# Patient Record
Sex: Male | Born: 1940 | Race: White | Hispanic: No | Marital: Married | Smoking: Former smoker
Health system: Southern US, Community
[De-identification: ages and names within clinical notes are randomized; demographics above are authoritative.]

## PROBLEM LIST (undated history)

## (undated) DIAGNOSIS — M1712 Unilateral primary osteoarthritis, left knee: Secondary | ICD-10-CM

## (undated) DIAGNOSIS — T63441A Toxic effect of venom of bees, accidental (unintentional), initial encounter: Secondary | ICD-10-CM

## (undated) DIAGNOSIS — I219 Acute myocardial infarction, unspecified: Secondary | ICD-10-CM

## (undated) DIAGNOSIS — E785 Hyperlipidemia, unspecified: Secondary | ICD-10-CM

## (undated) DIAGNOSIS — I10 Essential (primary) hypertension: Secondary | ICD-10-CM

## (undated) HISTORY — PX: WRIST SURGERY: SHX841

## (undated) HISTORY — DX: Unilateral primary osteoarthritis, left knee: M17.12

## (undated) HISTORY — PX: BILATERAL CARPAL TUNNEL RELEASE: SHX6508

## (undated) HISTORY — PX: JOINT REPLACEMENT: SHX530

## (undated) HISTORY — DX: Hyperlipidemia, unspecified: E78.5

## (undated) HISTORY — DX: Essential (primary) hypertension: I10

## (undated) HISTORY — PX: EYE SURGERY: SHX253

## (undated) HISTORY — PX: HERNIA REPAIR: SHX51

## (undated) HISTORY — DX: Acute myocardial infarction, unspecified: I21.9

## (undated) HISTORY — PX: KNEE ARTHROSCOPY: SUR90

## (undated) HISTORY — PX: TONSILLECTOMY: SUR1361

## (undated) HISTORY — PX: APPENDECTOMY: SHX54

---
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
## 2000-05-01 ENCOUNTER — Ambulatory Visit (HOSPITAL_COMMUNITY): Admission: RE | Admit: 2000-05-01 | Discharge: 2000-05-01 | Payer: Self-pay | Admitting: Oncology

## 2000-05-01 ENCOUNTER — Encounter: Payer: Self-pay | Admitting: Oncology

## 2000-08-01 ENCOUNTER — Encounter: Payer: Self-pay | Admitting: Oncology

## 2000-08-01 ENCOUNTER — Ambulatory Visit (HOSPITAL_COMMUNITY): Admission: RE | Admit: 2000-08-01 | Discharge: 2000-08-01 | Payer: Self-pay | Admitting: Oncology

## 2001-10-19 ENCOUNTER — Encounter: Admission: RE | Admit: 2001-10-19 | Discharge: 2001-10-19 | Payer: Self-pay | Admitting: Family Medicine

## 2001-10-19 ENCOUNTER — Encounter: Payer: Self-pay | Admitting: Family Medicine

## 2001-11-02 ENCOUNTER — Encounter: Payer: Self-pay | Admitting: Family Medicine

## 2001-11-02 ENCOUNTER — Encounter: Admission: RE | Admit: 2001-11-02 | Discharge: 2001-11-02 | Payer: Self-pay | Admitting: Family Medicine

## 2003-09-08 ENCOUNTER — Ambulatory Visit (HOSPITAL_COMMUNITY): Admission: RE | Admit: 2003-09-08 | Discharge: 2003-09-08 | Payer: Self-pay | Admitting: Orthopaedic Surgery

## 2004-09-24 ENCOUNTER — Ambulatory Visit: Payer: Self-pay | Admitting: Oncology

## 2005-01-28 ENCOUNTER — Ambulatory Visit: Payer: Self-pay | Admitting: Oncology

## 2005-02-09 ENCOUNTER — Ambulatory Visit (HOSPITAL_COMMUNITY): Admission: RE | Admit: 2005-02-09 | Discharge: 2005-02-09 | Payer: Self-pay | Admitting: Oncology

## 2005-06-08 ENCOUNTER — Ambulatory Visit: Payer: Self-pay | Admitting: Oncology

## 2005-06-20 ENCOUNTER — Encounter: Admission: RE | Admit: 2005-06-20 | Discharge: 2005-06-20 | Payer: Self-pay | Admitting: Specialist

## 2005-10-04 ENCOUNTER — Ambulatory Visit: Payer: Self-pay | Admitting: Oncology

## 2005-11-28 ENCOUNTER — Inpatient Hospital Stay (HOSPITAL_COMMUNITY): Admission: RE | Admit: 2005-11-28 | Discharge: 2005-12-02 | Payer: Self-pay | Admitting: Orthopedic Surgery

## 2006-01-26 ENCOUNTER — Ambulatory Visit: Payer: Self-pay | Admitting: Oncology

## 2006-02-01 LAB — CBC & DIFF AND RETIC
Basophils Absolute: 0.1 10*3/uL (ref 0.0–0.1)
EOS%: 2.7 % (ref 0.0–7.0)
Eosinophils Absolute: 0.2 10*3/uL (ref 0.0–0.5)
HGB: 14.5 g/dL (ref 13.0–17.1)
LYMPH%: 36.3 % (ref 14.0–48.0)
MCH: 32 pg (ref 28.0–33.4)
MCV: 92.5 fL (ref 81.6–98.0)
MONO%: 9 % (ref 0.0–13.0)
NEUT#: 3.3 10*3/uL (ref 1.5–6.5)
Platelets: 340 10*3/uL (ref 145–400)
RBC: 4.51 10*6/uL (ref 4.20–5.71)
RDW: 13.4 % (ref 11.2–14.6)
RETIC #: 63.1 10*3/uL (ref 31.8–103.9)

## 2006-02-01 LAB — COMPREHENSIVE METABOLIC PANEL
Albumin: 4.2 g/dL (ref 3.5–5.2)
BUN: 17 mg/dL (ref 6–23)
Calcium: 9.3 mg/dL (ref 8.4–10.5)
Chloride: 105 mEq/L (ref 96–112)
Creatinine, Ser: 1.1 mg/dL (ref 0.4–1.5)
Glucose, Bld: 157 mg/dL — ABNORMAL HIGH (ref 70–99)
Potassium: 4.2 mEq/L (ref 3.5–5.3)

## 2006-02-01 LAB — FERRITIN: Ferritin: 49 ng/mL (ref 22–322)

## 2006-02-01 LAB — IRON AND TIBC: %SAT: 40 % (ref 20–55)

## 2006-05-29 ENCOUNTER — Ambulatory Visit: Payer: Self-pay | Admitting: Oncology

## 2006-05-31 LAB — CBC WITH DIFFERENTIAL/PLATELET
Basophils Absolute: 0.1 10*3/uL (ref 0.0–0.1)
EOS%: 3.1 % (ref 0.0–7.0)
Eosinophils Absolute: 0.2 10*3/uL (ref 0.0–0.5)
HCT: 42.5 % (ref 38.7–49.9)
HGB: 15.2 g/dL (ref 13.0–17.1)
MCH: 31.1 pg (ref 28.0–33.4)
MCV: 87.2 fL (ref 81.6–98.0)
MONO%: 8.9 % (ref 0.0–13.0)
NEUT#: 4 10*3/uL (ref 1.5–6.5)
NEUT%: 51.6 % (ref 40.0–75.0)

## 2006-06-14 LAB — COMPREHENSIVE METABOLIC PANEL
Albumin: 3.9 g/dL (ref 3.5–5.2)
Alkaline Phosphatase: 64 U/L (ref 39–117)
CO2: 28 mEq/L (ref 19–32)
Glucose, Bld: 112 mg/dL — ABNORMAL HIGH (ref 70–99)
Potassium: 4 mEq/L (ref 3.5–5.3)
Sodium: 140 mEq/L (ref 135–145)
Total Protein: 6.9 g/dL (ref 6.0–8.3)

## 2006-06-14 LAB — CBC WITH DIFFERENTIAL/PLATELET
Basophils Absolute: 0.1 10*3/uL (ref 0.0–0.1)
EOS%: 3 % (ref 0.0–7.0)
Eosinophils Absolute: 0.2 10*3/uL (ref 0.0–0.5)
HGB: 13.8 g/dL (ref 13.0–17.1)
NEUT#: 3.5 10*3/uL (ref 1.5–6.5)
RDW: 13.2 % (ref 11.2–14.6)
WBC: 6.6 10*3/uL (ref 4.0–10.0)
lymph#: 2.2 10*3/uL (ref 0.9–3.3)

## 2006-06-14 LAB — IRON AND TIBC
Iron: 129 ug/dL (ref 42–165)
UIBC: 172 ug/dL

## 2006-10-06 ENCOUNTER — Ambulatory Visit: Payer: Self-pay | Admitting: Oncology

## 2006-10-11 LAB — CBC & DIFF AND RETIC
BASO%: 0.9 % (ref 0.0–2.0)
Basophils Absolute: 0.1 10*3/uL (ref 0.0–0.1)
HCT: 41.6 % (ref 38.7–49.9)
HGB: 15.1 g/dL (ref 13.0–17.1)
IRF: 0.31 (ref 0.070–0.380)
MCHC: 36.2 g/dL — ABNORMAL HIGH (ref 32.0–35.9)
MONO#: 0.6 10*3/uL (ref 0.1–0.9)
NEUT%: 57.6 % (ref 40.0–75.0)
RDW: 12.7 % (ref 11.2–14.6)
RETIC #: 48.9 10*3/uL (ref 31.8–103.9)
WBC: 7.6 10*3/uL (ref 4.0–10.0)
lymph#: 2.3 10*3/uL (ref 0.9–3.3)

## 2006-10-11 LAB — COMPREHENSIVE METABOLIC PANEL
Albumin: 4.2 g/dL (ref 3.5–5.2)
Alkaline Phosphatase: 60 U/L (ref 39–117)
BUN: 22 mg/dL (ref 6–23)
CO2: 27 mEq/L (ref 19–32)
Glucose, Bld: 111 mg/dL — ABNORMAL HIGH (ref 70–99)
Potassium: 4.5 mEq/L (ref 3.5–5.3)

## 2006-10-11 LAB — IRON AND TIBC
%SAT: 59 % — ABNORMAL HIGH (ref 20–55)
TIBC: 292 ug/dL (ref 215–435)

## 2006-10-11 LAB — MORPHOLOGY: RBC Comments: NORMAL

## 2006-10-12 ENCOUNTER — Ambulatory Visit (HOSPITAL_COMMUNITY): Admission: RE | Admit: 2006-10-12 | Discharge: 2006-10-12 | Payer: Self-pay | Admitting: Oncology

## 2006-12-13 IMAGING — CR DG CHEST 2V
2 series · 2 of 2 positions shown · non-contrast
Comparison: 11/23/05.

CLINICAL DATA: Left knee replacement, epigastric pain.  Postop fever. 
 CHEST - 2 VIEW:

[w chest pa]
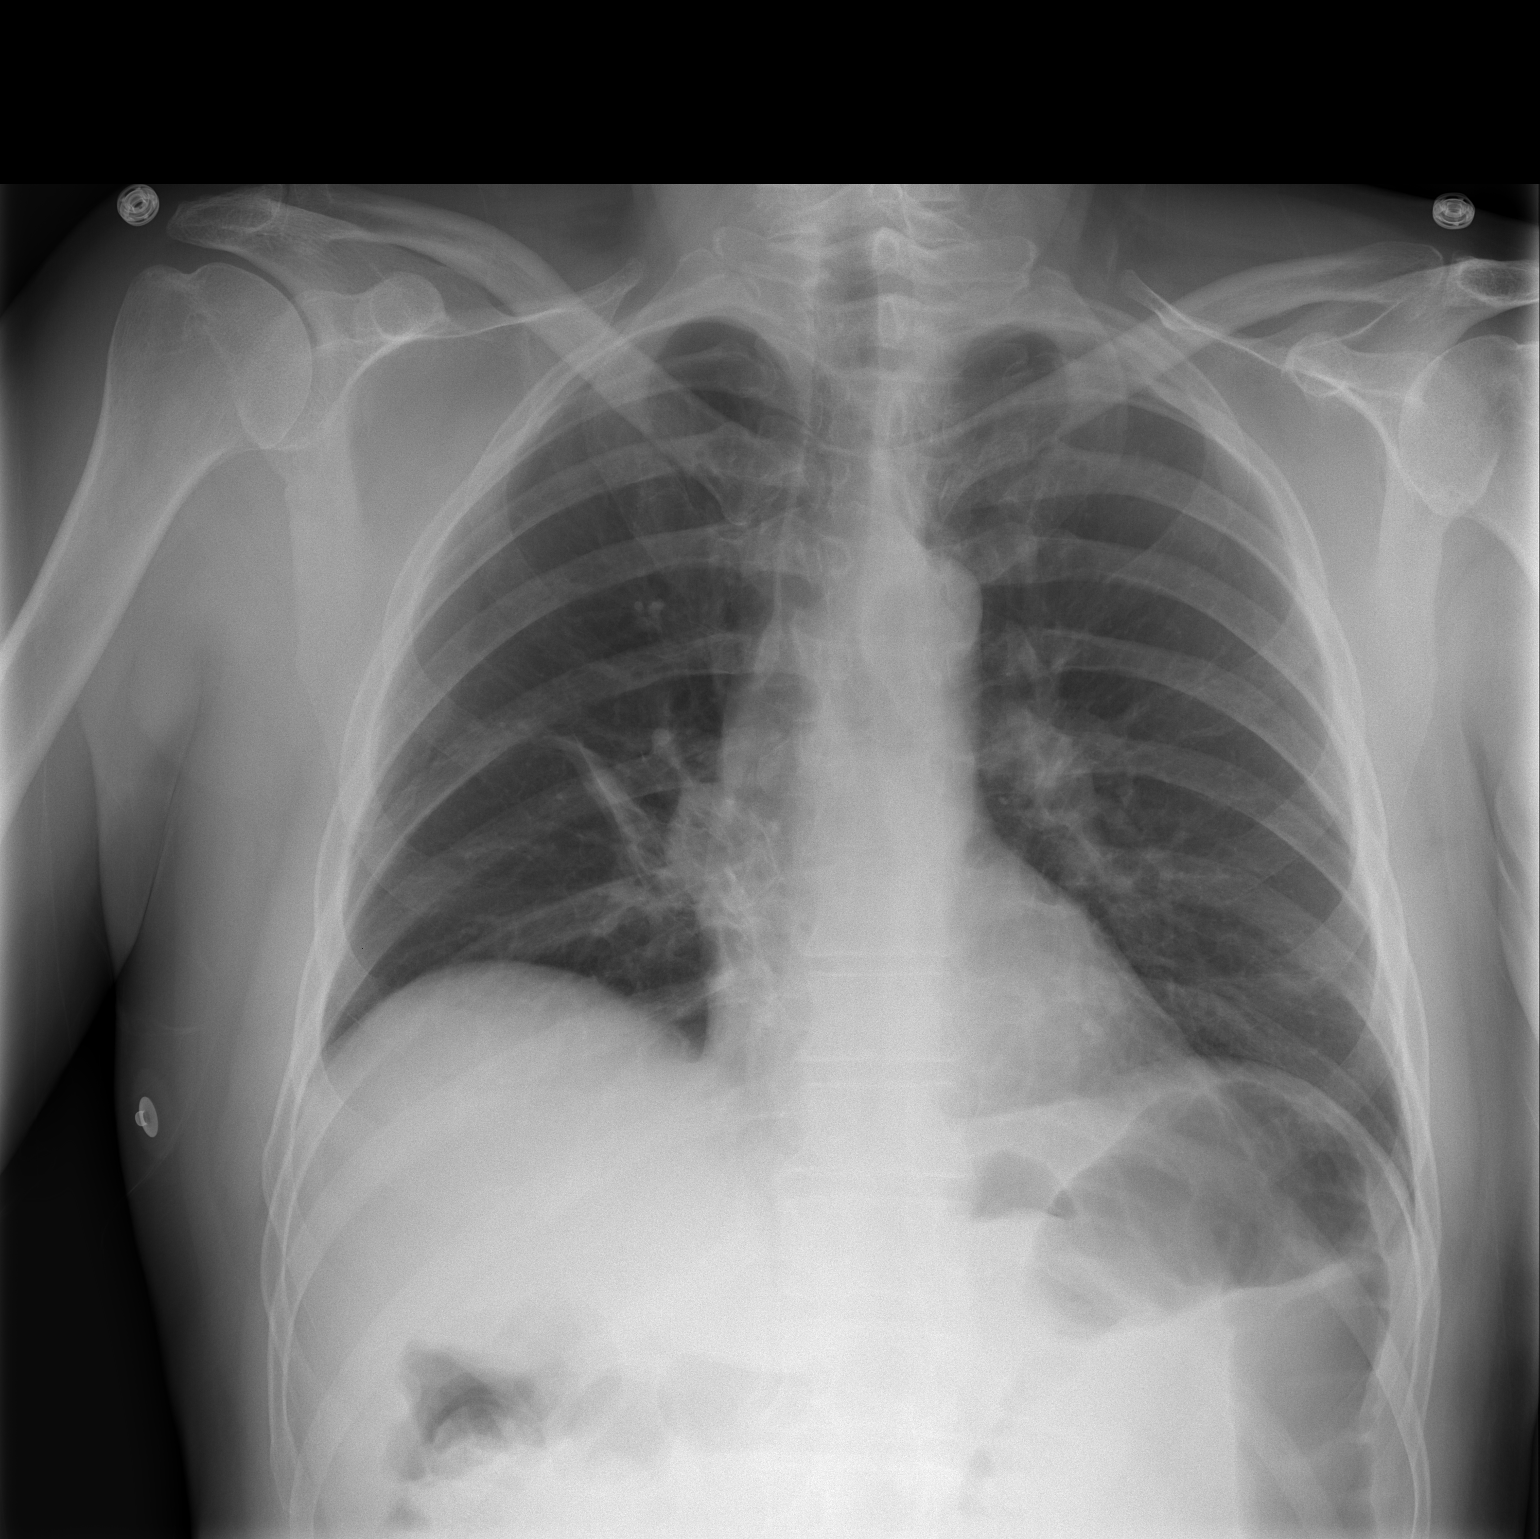

[w chest lat]
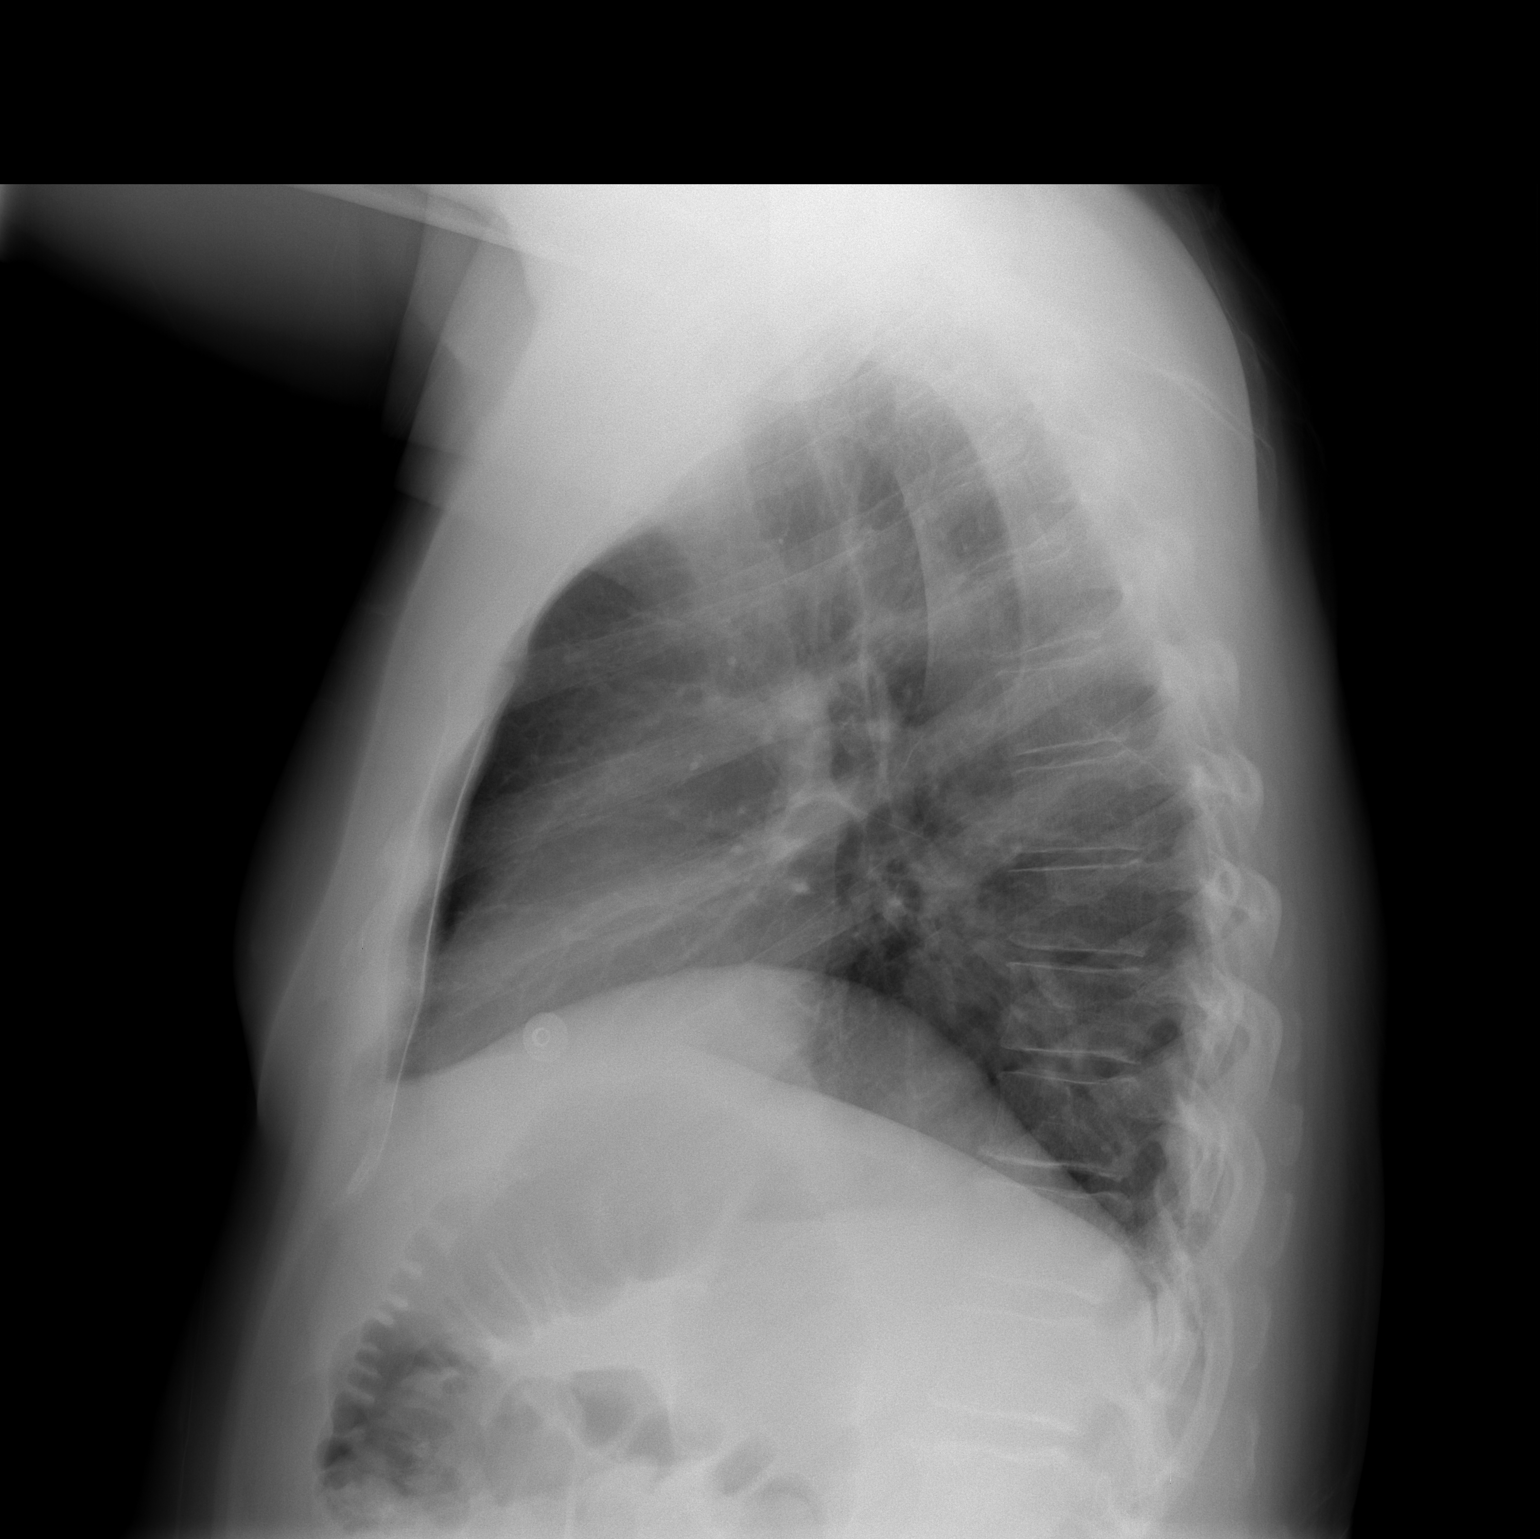

[2 of 2 positions shown; findings below may reference images not displayed]

Subsegmental atelectasis in the right lower lung zone.  No active infiltrate.  Cardiomediastinal silhouette appears unremarkable.
IMPRESSION: Linear right lower lung zone.

## 2007-02-05 ENCOUNTER — Ambulatory Visit: Payer: Self-pay | Admitting: Oncology

## 2007-02-07 LAB — CBC WITH DIFFERENTIAL/PLATELET
Eosinophils Absolute: 0.2 10*3/uL (ref 0.0–0.5)
MONO#: 0.7 10*3/uL (ref 0.1–0.9)
NEUT#: 3.3 10*3/uL (ref 1.5–6.5)
RBC: 4.72 10*6/uL (ref 4.20–5.71)
RDW: 13.1 % (ref 11.2–14.6)
WBC: 6.8 10*3/uL (ref 4.0–10.0)
lymph#: 2.7 10*3/uL (ref 0.9–3.3)

## 2007-06-07 ENCOUNTER — Ambulatory Visit: Payer: Self-pay | Admitting: Oncology

## 2007-06-11 LAB — CBC & DIFF AND RETIC
Basophils Absolute: 0.1 10*3/uL (ref 0.0–0.1)
Eosinophils Absolute: 0.1 10*3/uL (ref 0.0–0.5)
HCT: 42.1 % (ref 38.7–49.9)
HGB: 15.3 g/dL (ref 13.0–17.1)
LYMPH%: 36.8 % (ref 14.0–48.0)
MONO#: 0.6 10*3/uL (ref 0.1–0.9)
NEUT#: 3.2 10*3/uL (ref 1.5–6.5)
NEUT%: 50.5 % (ref 40.0–75.0)
Platelets: 284 10*3/uL (ref 145–400)
RBC: 4.58 10*6/uL (ref 4.20–5.71)
Retic %: 1.3 % (ref 0.7–2.3)
WBC: 6.4 10*3/uL (ref 4.0–10.0)

## 2007-06-11 LAB — COMPREHENSIVE METABOLIC PANEL
ALT: 19 U/L (ref 0–53)
AST: 20 U/L (ref 0–37)
Alkaline Phosphatase: 61 U/L (ref 39–117)
Chloride: 106 mEq/L (ref 96–112)
Creatinine, Ser: 1.09 mg/dL (ref 0.40–1.50)
Total Bilirubin: 1.4 mg/dL — ABNORMAL HIGH (ref 0.3–1.2)

## 2007-10-09 ENCOUNTER — Ambulatory Visit: Payer: Self-pay | Admitting: Oncology

## 2007-10-11 LAB — COMPREHENSIVE METABOLIC PANEL
Alkaline Phosphatase: 60 U/L (ref 39–117)
CO2: 23 mEq/L (ref 19–32)
Creatinine, Ser: 1.1 mg/dL (ref 0.40–1.50)
Glucose, Bld: 123 mg/dL — ABNORMAL HIGH (ref 70–99)
Total Bilirubin: 1.3 mg/dL — ABNORMAL HIGH (ref 0.3–1.2)

## 2007-10-11 LAB — CBC WITH DIFFERENTIAL/PLATELET
BASO%: 0.5 % (ref 0.0–2.0)
Eosinophils Absolute: 0.2 10*3/uL (ref 0.0–0.5)
HCT: 44 % (ref 38.7–49.9)
LYMPH%: 33.3 % (ref 14.0–48.0)
MCHC: 35.8 g/dL (ref 32.0–35.9)
MCV: 94.4 fL (ref 81.6–98.0)
MONO#: 0.6 10*3/uL (ref 0.1–0.9)
MONO%: 9.3 % (ref 0.0–13.0)
NEUT%: 53.1 % (ref 40.0–75.0)
Platelets: 295 10*3/uL (ref 145–400)
RBC: 4.66 10*6/uL (ref 4.20–5.71)
WBC: 6.2 10*3/uL (ref 4.0–10.0)

## 2007-10-11 LAB — IRON AND TIBC
%SAT: 53 % (ref 20–55)
Iron: 137 ug/dL (ref 42–165)
TIBC: 257 ug/dL (ref 215–435)

## 2007-10-11 LAB — FERRITIN: Ferritin: 124 ng/mL (ref 22–322)

## 2007-10-11 LAB — LACTATE DEHYDROGENASE: LDH: 200 U/L (ref 94–250)

## 2008-02-05 ENCOUNTER — Ambulatory Visit: Payer: Self-pay | Admitting: Oncology

## 2008-02-07 LAB — IRON AND TIBC
TIBC: 253 ug/dL (ref 215–435)
UIBC: 126 ug/dL

## 2008-02-07 LAB — COMPREHENSIVE METABOLIC PANEL
ALT: 24 U/L (ref 0–53)
AST: 21 U/L (ref 0–37)
Alkaline Phosphatase: 71 U/L (ref 39–117)
Sodium: 142 mEq/L (ref 135–145)
Total Bilirubin: 1.2 mg/dL (ref 0.3–1.2)
Total Protein: 7.2 g/dL (ref 6.0–8.3)

## 2008-02-07 LAB — CBC WITH DIFFERENTIAL/PLATELET
BASO%: 0.6 % (ref 0.0–2.0)
LYMPH%: 34.2 % (ref 14.0–48.0)
MCHC: 34.9 g/dL (ref 32.0–35.9)
MCV: 92.5 fL (ref 81.6–98.0)
MONO%: 9.8 % (ref 0.0–13.0)
Platelets: 286 10*3/uL (ref 145–400)
RBC: 4.8 10*6/uL (ref 4.20–5.71)
WBC: 5.8 10*3/uL (ref 4.0–10.0)

## 2008-05-05 ENCOUNTER — Ambulatory Visit: Payer: Self-pay | Admitting: Oncology

## 2008-05-19 LAB — CBC WITH DIFFERENTIAL/PLATELET
BASO%: 0.6 % (ref 0.0–2.0)
LYMPH%: 33.5 % (ref 14.0–48.0)
MCH: 33.7 pg — ABNORMAL HIGH (ref 28.0–33.4)
MCHC: 35.7 g/dL (ref 32.0–35.9)
MCV: 94.4 fL (ref 81.6–98.0)
MONO%: 9.8 % (ref 0.0–13.0)
Platelets: 280 10*3/uL (ref 145–400)
RBC: 4.9 10*6/uL (ref 4.20–5.71)

## 2008-05-19 LAB — IRON AND TIBC
Iron: 165 ug/dL (ref 42–165)
TIBC: 274 ug/dL (ref 215–435)
UIBC: 109 ug/dL

## 2008-08-06 ENCOUNTER — Ambulatory Visit: Payer: Self-pay | Admitting: Oncology

## 2008-08-14 LAB — CBC WITH DIFFERENTIAL/PLATELET
Basophils Absolute: 0.1 10*3/uL (ref 0.0–0.1)
Eosinophils Absolute: 0.2 10*3/uL (ref 0.0–0.5)
HGB: 16.9 g/dL (ref 13.0–17.1)
LYMPH%: 38.1 % (ref 14.0–48.0)
MCV: 89.3 fL (ref 81.6–98.0)
MONO%: 8.3 % (ref 0.0–13.0)
NEUT#: 4 10*3/uL (ref 1.5–6.5)
Platelets: 340 10*3/uL (ref 145–400)

## 2008-09-25 ENCOUNTER — Ambulatory Visit (HOSPITAL_COMMUNITY): Admission: RE | Admit: 2008-09-25 | Discharge: 2008-09-25 | Payer: Self-pay | Admitting: Specialist

## 2008-11-04 ENCOUNTER — Ambulatory Visit: Payer: Self-pay | Admitting: Oncology

## 2008-11-06 LAB — CBC WITH DIFFERENTIAL/PLATELET
BASO%: 0.1 % (ref 0.0–2.0)
EOS%: 0 % (ref 0.0–7.0)
HCT: 44.2 % (ref 38.4–49.9)
LYMPH%: 10.8 % — ABNORMAL LOW (ref 14.0–49.0)
MCH: 33.2 pg (ref 27.2–33.4)
MCHC: 35 g/dL (ref 32.0–36.0)
NEUT%: 85.6 % — ABNORMAL HIGH (ref 39.0–75.0)
RBC: 4.66 10*6/uL (ref 4.20–5.82)
lymph#: 1.6 10*3/uL (ref 0.9–3.3)

## 2008-11-06 LAB — IRON AND TIBC: TIBC: 294 ug/dL (ref 215–435)

## 2009-02-04 ENCOUNTER — Ambulatory Visit: Payer: Self-pay | Admitting: Oncology

## 2009-02-06 LAB — COMPREHENSIVE METABOLIC PANEL
ALT: 29 U/L (ref 0–53)
CO2: 25 mEq/L (ref 19–32)
Creatinine, Ser: 1.17 mg/dL (ref 0.40–1.50)
Total Bilirubin: 1.1 mg/dL (ref 0.3–1.2)

## 2009-02-06 LAB — CBC WITH DIFFERENTIAL/PLATELET
BASO%: 0.6 % (ref 0.0–2.0)
EOS%: 3.8 % (ref 0.0–7.0)
HCT: 46.2 % (ref 38.4–49.9)
LYMPH%: 32.1 % (ref 14.0–49.0)
MCH: 33.2 pg (ref 27.2–33.4)
MCHC: 34.7 g/dL (ref 32.0–36.0)
MCV: 95.6 fL (ref 79.3–98.0)
MONO#: 0.6 10*3/uL (ref 0.1–0.9)
MONO%: 7.7 % (ref 0.0–14.0)
NEUT%: 55.8 % (ref 39.0–75.0)
Platelets: 271 10*3/uL (ref 140–400)

## 2009-02-06 LAB — IRON AND TIBC
%SAT: 59 % — ABNORMAL HIGH (ref 20–55)
TIBC: 276 ug/dL (ref 215–435)

## 2009-02-06 LAB — FERRITIN: Ferritin: 76 ng/mL (ref 22–322)

## 2009-02-06 LAB — LACTATE DEHYDROGENASE: LDH: 178 U/L (ref 94–250)

## 2009-05-06 ENCOUNTER — Ambulatory Visit: Payer: Self-pay | Admitting: Oncology

## 2009-05-08 LAB — CBC WITH DIFFERENTIAL/PLATELET
BASO%: 0.6 % (ref 0.0–2.0)
Basophils Absolute: 0 10*3/uL (ref 0.0–0.1)
EOS%: 4.4 % (ref 0.0–7.0)
Eosinophils Absolute: 0.3 10*3/uL (ref 0.0–0.5)
HCT: 41.4 % (ref 38.4–49.9)
HGB: 14.6 g/dL (ref 13.0–17.1)
LYMPH%: 36 % (ref 14.0–49.0)
MCH: 33.7 pg — ABNORMAL HIGH (ref 27.2–33.4)
MCHC: 35.3 g/dL (ref 32.0–36.0)
MCV: 95.5 fL (ref 79.3–98.0)
MONO#: 0.5 10*3/uL (ref 0.1–0.9)
MONO%: 8.1 % (ref 0.0–14.0)
NEUT#: 3.1 10*3/uL (ref 1.5–6.5)
NEUT%: 50.9 % (ref 39.0–75.0)
Platelets: 256 10*3/uL (ref 140–400)
RBC: 4.34 10*6/uL (ref 4.20–5.82)
RDW: 12.8 % (ref 11.0–14.6)
WBC: 6.1 10*3/uL (ref 4.0–10.3)
lymph#: 2.2 10*3/uL (ref 0.9–3.3)

## 2009-05-08 LAB — IRON AND TIBC
%SAT: 62 % — ABNORMAL HIGH (ref 20–55)
Iron: 156 ug/dL (ref 42–165)
TIBC: 251 ug/dL (ref 215–435)
UIBC: 95 ug/dL

## 2009-08-04 ENCOUNTER — Ambulatory Visit: Payer: Self-pay | Admitting: Oncology

## 2009-08-07 LAB — CBC WITH DIFFERENTIAL/PLATELET
BASO%: 1 % (ref 0.0–2.0)
Basophils Absolute: 0.1 10*3/uL (ref 0.0–0.1)
EOS%: 3.6 % (ref 0.0–7.0)
Eosinophils Absolute: 0.2 10*3/uL (ref 0.0–0.5)
HCT: 45.6 % (ref 38.4–49.9)
HGB: 16.3 g/dL (ref 13.0–17.1)
LYMPH%: 34.5 % (ref 14.0–49.0)
MCH: 33.3 pg (ref 27.2–33.4)
MCHC: 35.7 g/dL (ref 32.0–36.0)
MCV: 93.3 fL (ref 79.3–98.0)
MONO#: 0.6 10*3/uL (ref 0.1–0.9)
MONO%: 9.2 % (ref 0.0–14.0)
NEUT#: 3.2 10*3/uL (ref 1.5–6.5)
NEUT%: 51.7 % (ref 39.0–75.0)
Platelets: 253 10*3/uL (ref 140–400)
RBC: 4.89 10*6/uL (ref 4.20–5.82)
RDW: 12.3 % (ref 11.0–14.6)
WBC: 6.1 10*3/uL (ref 4.0–10.3)
lymph#: 2.1 10*3/uL (ref 0.9–3.3)

## 2009-08-07 LAB — FERRITIN: Ferritin: 45 ng/mL (ref 22–322)

## 2009-08-07 LAB — IRON AND TIBC
%SAT: 61 % — ABNORMAL HIGH (ref 20–55)
Iron: 183 ug/dL — ABNORMAL HIGH (ref 42–165)
TIBC: 298 ug/dL (ref 215–435)
UIBC: 115 ug/dL

## 2009-11-03 ENCOUNTER — Ambulatory Visit: Payer: Self-pay | Admitting: Oncology

## 2009-11-12 LAB — CBC WITH DIFFERENTIAL/PLATELET
BASO%: 0.6 % (ref 0.0–2.0)
Basophils Absolute: 0 10*3/uL (ref 0.0–0.1)
EOS%: 4 % (ref 0.0–7.0)
Eosinophils Absolute: 0.3 10*3/uL (ref 0.0–0.5)
HCT: 44.3 % (ref 38.4–49.9)
HGB: 15.6 g/dL (ref 13.0–17.1)
LYMPH%: 32.5 % (ref 14.0–49.0)
MCH: 33.9 pg — ABNORMAL HIGH (ref 27.2–33.4)
MCHC: 35.2 g/dL (ref 32.0–36.0)
MCV: 96.3 fL (ref 79.3–98.0)
MONO#: 0.7 10*3/uL (ref 0.1–0.9)
MONO%: 8.8 % (ref 0.0–14.0)
NEUT#: 4.1 10*3/uL (ref 1.5–6.5)
NEUT%: 54.1 % (ref 39.0–75.0)
Platelets: 263 10*3/uL (ref 140–400)
RBC: 4.6 10*6/uL (ref 4.20–5.82)
RDW: 12.9 % (ref 11.0–14.6)
WBC: 7.6 10*3/uL (ref 4.0–10.3)
lymph#: 2.5 10*3/uL (ref 0.9–3.3)

## 2009-11-12 LAB — IRON AND TIBC
%SAT: 61 % — ABNORMAL HIGH (ref 20–55)
Iron: 166 ug/dL — ABNORMAL HIGH (ref 42–165)
TIBC: 271 ug/dL (ref 215–435)
UIBC: 105 ug/dL

## 2009-11-12 LAB — FERRITIN: Ferritin: 48 ng/mL (ref 22–322)

## 2010-02-02 ENCOUNTER — Ambulatory Visit: Payer: Self-pay | Admitting: Oncology

## 2010-02-04 LAB — CBC WITH DIFFERENTIAL/PLATELET
BASO%: 0.7 % (ref 0.0–2.0)
Basophils Absolute: 0 10*3/uL (ref 0.0–0.1)
EOS%: 3.3 % (ref 0.0–7.0)
Eosinophils Absolute: 0.2 10*3/uL (ref 0.0–0.5)
HCT: 44.9 % (ref 38.4–49.9)
HGB: 16.1 g/dL (ref 13.0–17.1)
LYMPH%: 31.5 % (ref 14.0–49.0)
MCH: 34 pg — ABNORMAL HIGH (ref 27.2–33.4)
MCHC: 35.9 g/dL (ref 32.0–36.0)
MCV: 94.9 fL (ref 79.3–98.0)
MONO#: 0.6 10*3/uL (ref 0.1–0.9)
MONO%: 8.2 % (ref 0.0–14.0)
NEUT#: 4 10*3/uL (ref 1.5–6.5)
NEUT%: 56.3 % (ref 39.0–75.0)
Platelets: 275 10*3/uL (ref 140–400)
RBC: 4.74 10*6/uL (ref 4.20–5.82)
RDW: 12.8 % (ref 11.0–14.6)
WBC: 7.2 10*3/uL (ref 4.0–10.3)
lymph#: 2.3 10*3/uL (ref 0.9–3.3)

## 2010-02-04 LAB — IRON AND TIBC
%SAT: 43 % (ref 20–55)
Iron: 112 ug/dL (ref 42–165)
TIBC: 261 ug/dL (ref 215–435)
UIBC: 149 ug/dL

## 2010-02-04 LAB — COMPREHENSIVE METABOLIC PANEL
ALT: 23 U/L (ref 0–53)
AST: 18 U/L (ref 0–37)
Albumin: 4.2 g/dL (ref 3.5–5.2)
Alkaline Phosphatase: 65 U/L (ref 39–117)
BUN: 19 mg/dL (ref 6–23)
CO2: 22 mEq/L (ref 19–32)
Calcium: 9.3 mg/dL (ref 8.4–10.5)
Chloride: 104 mEq/L (ref 96–112)
Creatinine, Ser: 1.29 mg/dL (ref 0.40–1.50)
Glucose, Bld: 137 mg/dL — ABNORMAL HIGH (ref 70–99)
Potassium: 4.2 mEq/L (ref 3.5–5.3)
Sodium: 139 mEq/L (ref 135–145)
Total Bilirubin: 1.3 mg/dL — ABNORMAL HIGH (ref 0.3–1.2)
Total Protein: 7.1 g/dL (ref 6.0–8.3)

## 2010-02-04 LAB — FERRITIN: Ferritin: 72 ng/mL (ref 22–322)

## 2010-05-14 ENCOUNTER — Ambulatory Visit: Payer: Self-pay | Admitting: Oncology

## 2010-08-24 ENCOUNTER — Ambulatory Visit: Payer: Self-pay | Admitting: Oncology

## 2010-08-25 LAB — CBC WITH DIFFERENTIAL/PLATELET
BASO%: 0.5 % (ref 0.0–2.0)
Basophils Absolute: 0 10*3/uL (ref 0.0–0.1)
EOS%: 3.1 % (ref 0.0–7.0)
Eosinophils Absolute: 0.2 10*3/uL (ref 0.0–0.5)
HCT: 46.2 % (ref 38.4–49.9)
HGB: 16.5 g/dL (ref 13.0–17.1)
LYMPH%: 32.5 % (ref 14.0–49.0)
MCH: 34.2 pg — ABNORMAL HIGH (ref 27.2–33.4)
MCHC: 35.7 g/dL (ref 32.0–36.0)
MCV: 95.7 fL (ref 79.3–98.0)
MONO#: 0.8 10*3/uL (ref 0.1–0.9)
MONO%: 9.8 % (ref 0.0–14.0)
NEUT#: 4.4 10*3/uL (ref 1.5–6.5)
NEUT%: 54.1 % (ref 39.0–75.0)
Platelets: 272 10*3/uL (ref 140–400)
RBC: 4.83 10*6/uL (ref 4.20–5.82)
RDW: 12.7 % (ref 11.0–14.6)
WBC: 8.1 10*3/uL (ref 4.0–10.3)
lymph#: 2.6 10*3/uL (ref 0.9–3.3)

## 2010-11-24 ENCOUNTER — Other Ambulatory Visit: Payer: Self-pay | Admitting: Oncology

## 2010-11-24 ENCOUNTER — Encounter (HOSPITAL_BASED_OUTPATIENT_CLINIC_OR_DEPARTMENT_OTHER): Payer: Medicare Other | Admitting: Oncology

## 2010-11-24 LAB — CBC WITH DIFFERENTIAL/PLATELET
BASO%: 0.5 % (ref 0.0–2.0)
Basophils Absolute: 0 10*3/uL (ref 0.0–0.1)
EOS%: 4.3 % (ref 0.0–7.0)
Eosinophils Absolute: 0.3 10*3/uL (ref 0.0–0.5)
HCT: 43.3 % (ref 38.4–49.9)
HGB: 15.3 g/dL (ref 13.0–17.1)
LYMPH%: 31.4 % (ref 14.0–49.0)
MCH: 33.3 pg (ref 27.2–33.4)
MCHC: 35.4 g/dL (ref 32.0–36.0)
MCV: 94.1 fL (ref 79.3–98.0)
MONO#: 0.6 10*3/uL (ref 0.1–0.9)
MONO%: 9.3 % (ref 0.0–14.0)
NEUT#: 3.8 10*3/uL (ref 1.5–6.5)
NEUT%: 54.5 % (ref 39.0–75.0)
Platelets: 256 10*3/uL (ref 140–400)
RBC: 4.6 10*6/uL (ref 4.20–5.82)
RDW: 12.8 % (ref 11.0–14.6)
WBC: 7 10*3/uL (ref 4.0–10.3)
lymph#: 2.2 10*3/uL (ref 0.9–3.3)

## 2011-01-21 NOTE — H&P (Signed)
NAME:  Jeffery Carpenter, Jeffery Carpenter NO.:  [id number]   MEDICAL RECORD NO.:  [id number]          PATIENT TYPE:  INP   LOCATION:  1516                         FACILITY:  [facility]   PHYSICIAN:  Ollen Gross, M.D.    DATE OF BIRTH:  Nov 01, 1940   DATE OF ADMISSION:  11/28/2005  DATE OF DISCHARGE:                                HISTORY & PHYSICAL   PRIMARY CARE PHYSICIAN:  L. Lupe Carney, M.D.   DATE OF ADMISSION:  Date of office visit and history and physical:  November 18, 2005.  Date of admission November 28, 2005.   CHIEF COMPLAINT:  Painful left uni-knee.   HISTORY OF PRESENT ILLNESS:  The patient is a 70 year old male who has been  seen by Dr. Lequita Halt for ongoing problems and left knee pain.  He has had a  uni-knee replacement in the past but has had continued pain which he  describes as a toothache most of the time.  He has been wearing a brace  but he states the knee is not giving out on him but it is hurting.  X-rays  do not show any evidence of any progressive loosening of the uni-  compartmental replacement but there are some signs there is a lucent line  between the cement and bone.  It is felt that the patient would best be  served by undergoing conversion of the uni-replacement over to a total.  Risks and benefits have been discussed and the patient is subsequently  admitted to the hospital.  The patient has been cleared by Dr. Lupe Carney  for the up and coming surgery.   ALLERGIES:  No known drug allergies.  INTOLERANCES:  PENICILLIN - Patient  states that it just does not work.   PAST MEDICAL HISTORY:  1.  History of migraines.  2.  Hiatal hernia.  3.  Past history of gastrointestinal bleed secondary to NSAID's.  4.  Hemachromatosis.  5.  History of diphtheria as a child.   PAST SURGICAL HISTORY:  Tonsillectomy, appendectomy, hernia surgery,  olecranon bursectomy, bilateral carpal tunnel surgery and left knee uni-  compartmental replacement.   SOCIAL  HISTORY:  Married, Curator, nonsmoker, occasionally  intake of alcohol.  Two children.  His wife will be assisting with care  after surgery.   FAMILY HISTORY:  Father deceased with history of heart disease, diabetes and  cancer.  Mother with history of colon cancer.  Brother deceased with history  of hemachromatosis. He has a younger brother with colon cancer.   REVIEW OF SYSTEMS:  GENERAL:  No fevers, chills, night sweats.  NEUROLOGICAL:  No seizures, syncope or paralysis. RESPIRATORY:  No  productive cough, hemoptysis or shortness of breath.  CARDIOVASCULAR:  No  chest pain, angina or orthopnea. GASTROINTESTINAL:  No nausea, vomiting,  diarrhea, constipation.  History of gastrointestinal bleed following  previous surgery.  No blood or mucous in the stool.  GENITOURINARY:  A  little bit of nocturia.  No dysuria, hematuria, discharge.  MUSCULOSKELETAL:  Left knee.   PHYSICAL EXAMINATION:  VITAL SIGNS:  Pulse 88, respirations 12, blood  pressure 112/78.  GENERAL APPEARANCE:  69 year old white male, well-nourished, well-developed,  in no acute distress.  He is awake, alert and cooperative, average  historian.  HEENT:  Normocephalic, atraumatic. Pupils equal, round, reactive. Oropharynx  clear. Extraocular movements intact.Marland Kitchen  NECK:  Supple.  No carotid bruits.  CHEST:  Clear anterior and posterior chest with no rhonchi, rales or  wheezes.  HEART:  Regular rhythm, no murmurs.  S1, S2 noted.  ABDOMEN:  Soft, nontender.  Bowel sounds present.  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  This is being currently followed by Dr.  Donnie Coffin.  RECTAL, BREASTS, GENITALIA:  Not done, not pertinent to present illness.  EXTREMITIES:  Left knee no effusion. He is tender over the medial and  anterior knee.  Range of motion is 0 to 130, no instability.   IMPRESSION:  1.  Painful left knee uni-compartmental replacement.  2.  History of migraines.  3.  Hiatal hernia.  4.   History of gastrointestinal bleed.  5.  Hemachromatosis.   PLAN:  Patient is admitted to [facility] to undergo a conversion  of uni-knee over to a left total knee.  Surgery will be performed by Dr.  Ollen Gross.  His medical physician, Dr. Lupe Carney, will be notified  of the room number and admission and consulted if needed for medical  assistance with the patient throughout the hospital course or we will have  the [facility] Hospitalist's follow him if needed.      Alexzandrew L. Julien Girt, P.A.      Ollen Gross, M.D.  Electronically Signed    ALP/MEDQ  D:  11/27/2005  T:  11/29/2005  Job:  [id number]   cc:   L. Lupe Carney, M.D.  Fax: [phone]

## 2011-01-21 NOTE — Discharge Summary (Signed)
NAME:  Jeffery Carpenter, Jeffery Carpenter NO.:  [id number]   MEDICAL RECORD NO.:  [id number]          PATIENT TYPE:  INP   LOCATION:  1516                         FACILITY:  [facility]   PHYSICIAN:  Ollen Gross, M.D.    DATE OF BIRTH:  03/26/1941   DATE OF ADMISSION:  11/28/2005  DATE OF DISCHARGE:                                 DISCHARGE SUMMARY   ADMISSION DIAGNOSES:  1.  Painful left unicompartmental replacement.  2.  History of migraines.  3.  Hiatal hernia.  4.  History of gastrointestinal bleed secondary to NSAIDs.  5.  Hemachromatosis.   DISCHARGE DIAGNOSES:  1.  Painful loose left unicompartmental replacement, status post revision,      unicompartmental replacement with conversion to left total knee.  2.  Postoperative atelectasis.  3.  Postoperative leukocytosis, likely secondary to #2.  4.  Mild postoperative blood loss anemia, did not require transfusion.  5.  History of migraines.  6.  Hiatal hernia.  7.  History of gastrointestinal bleed secondary to NSAIDs.  8.  Hemachromatosis.   PROCEDURE:  On November 28, 2005, revision of unicompartmental replacement,  conversion over to a left total knee arthroplasty.   SURGEON:  Ollen Gross, M.D.   ASSISTANT:  Alexzandrew L. Perkins, P.A.-C.   ANESTHESIA:  General with postoperative Marcaine pain pump.   TOURNIQUET TIME:  Fifty-five minutes.   CONSULTS:  None.   BRIEF HISTORY:  Patient is a 70 year old male who had a left  unicompartmental replacement done about two years ago.  He has had  significant persistent pain.  Radiograph looks as though it may have had a  little loosening. He has noted some arthritic changes to the patella and  lateral compartments, best be served by converting over to a total knee.   LABORATORY DATA:  Preop CBC:  Hemoglobin 14.8, hematocrit 42.8, white cell  count 6.4.  Postop hemoglobin 12.1.  Last noted H&H 11.4 and 33.2.  White  count started out normal at 6.4.  Went up to 14 and then  came back down.  Was noted on its decline at 11.9.  PT/PTT preop 13.4 and 25, respectively.  INR 1.  Serial pro times followed.  Last noted PT/INR 22 and 1.9.  Chem  panel on admission:  Low albumin of 3.4.  High total bili of 1.6.  Remaining  chem panel all within normal limits.  Serial BMETs are followed.  Electrolytes remained within normal limits.  Glucose went up from 106 to  168.  Preop UA negative.  Follow-up UA positive protein, otherwise negative.  Blood group type A+.  Final urine culture was pending at the time of this  discharge summary.   X-RAYS:  Preop chest x-ray on November 23, 2005:  No evidence of active chest  disease.   Follow-up chest x-ray on December 01, 2005:  Subsegmental atelectasis, right  lower lung zone.  No active infiltrates.   EKG on November 23, 2005:  Normal sinus rhythm, when compared.  No significant  change since September 02, 2006, confirmed by Dr. Olga Millers.   HOSPITAL COURSE:  Patient was admitted  to [facility], tolerated  the procedure well, and was later transferred to the recovery room and then  to the orthopedic floor.  Started on PCA and p.o. analgesics for pain  control following surgery.  Had some pain but the biggest complaint was  difficulty sleeping.  On day #1, Hemovac drain was pulled.  Hemoglobin  looked good at 12.  Started physical therapy.  Encouraged p.o. medications.  By day #2, the patient was weaned over to p.o. meds, and the PCAs and IV's  were discontinued.  I was able to get a little bit more sleep on the second  day with a little bit better pain control.  The dressing was changed.  The  incision looked good.  The pain pump was removed.  Started to get up with  therapy, ambulating about 50 feet.  By day #3, he had a little bit of  complaint of numbness in the right hand, but that resolved after waking up.  I think it was felt to be more positional with lying in the bed.  He was  voiding okay, passing gas; however, no  vomiting at that point.  His  temperature went up on the night of day #2, morning of day #3.  It got as  high as 101.9.  His temperature was back down to 100.4.  Chest x-ray and UA  were ordered.  Chest x-ray did show atelectasis.  UA was negative.  The  urine culture was pending.  He had excellent urinary output.  His renal  function was stable.  Encouraged incentive spirometer.  About the following  day, #4, he was doing very well with his physical therapy, up ambulating 150  feet.  His temp had got up on the night of day #3 and morning of day #4, up  to 102.5 but came back down to 99.  On the morning of day #4, chest x-ray  only showed atelectasis, no infiltrate.  Encouraged pulmonary toilet and  incentive spirometer.  He was doing well from a pain standpoint, doing great  from a therapy standpoint.  The patient wanted to go home.   DISCHARGE PLAN:  1.  Patient was discharged home on March 30 , 2007.  2.  Discharge diagnoses:  Please see above.  3.  Discharge meds:  Coumadin, Percocet, Robaxin.  4.  Diet:  As tolerated.  5.  Follow up two weeks from surgery.  Call the office for an appointment.  6.  Activity:  He is weightbearing as tolerated to the left lower extremity.      Home health PT/OT and home health nursing.  Encourage mobility, which      will help out with his atelectasis.  Encourage pulmonary toilet and      incentive spirometer at home.  Total knee protocol.   DISPOSITION:  Home.   CONDITION ON DISCHARGE:  Improving.      Alexzandrew L. Julien Girt, P.A.      Ollen Gross, M.D.  Electronically Signed    ALP/MEDQ  D:  12/02/2005  T:  12/04/2005  Job:  [id number]   cc:   L. Lupe Carney, M.D.  Fax: [phone]

## 2011-01-21 NOTE — Op Note (Signed)
NAME:  Jeffery Carpenter, Jeffery Carpenter NO.:  [id number]   MEDICAL RECORD NO.:  [id number]          PATIENT TYPE:  INP   LOCATION:  NA                           FACILITY:  [facility]   PHYSICIAN:  Ollen Gross, M.D.    DATE OF BIRTH:  [birth year]   DATE OF PROCEDURE:  11/28/2005  DATE OF DISCHARGE:                                 OPERATIVE REPORT   PREOPERATIVE DIAGNOSIS:  Painful loose left knee unicompartmental  replacement.   POSTOPERATIVE DIAGNOSIS:  Painful loose left knee unicompartmental  replacement.   PROCEDURE:  Revision of unicompartmental replacement to total knee  arthroplasty, left knee.   SURGEON:  Dr. Lequita Halt   ASSISTANT:  Avel Peace, PA-C   ANESTHESIA:  General with postop Marcaine pain pump.   ESTIMATED BLOOD LOSS:  Minimal.   DRAINS:  Hemovac x1.   TOURNIQUET TIME:  55 minutes at 300 mmHg.   COMPLICATIONS:  None.   CONDITION:  Stable to recovery.   BRIEF CLINICAL NOTE:  Jeffery Carpenter is a 70 year old male who had a left knee  medial unicompartmental replacement done almost 2 years ago.  He has had  significant persistent pain, and radiographically it looks as though he may  have had a little subsidence.  He also has arthritic changes in the  patellofemoral and lateral compartments.  He presents now for conversion to  a total knee arthroplasty.   PROCEDURE IN DETAIL:  After successful administration of general anesthetic,  a tourniquet is placed high on the left thigh and left lower extremity  prepped and draped in the usual sterile fashion.  Extremity is wrapped in  Esmarch, knee flexed, tourniquet inflated to 300 mmHg.  I used the medial  parapatellar part of his previous incision and cut the skin with a blade  through the subcutaneous tissue to the level of the extensor mechanism.  A  fresh blade is used to make a medial parapatellar arthrotomy.  We did not  encounter more than a minimal amount of synovial fluid.  It was clear.  He  did not have  any abnormal-appearing tissue.  We then elevated soft tissue  over the proximal and medial tibia to the joint line with a knife and into  semimembranosus bursa with a Cobb elevator.  Soft tissue laterally is also  elevated with attention being paid to avoiding the patellar tendon on tibial  tubercle.  Patella is everted, knee flexed 90 degrees.  ACL was minuscule  and nonfunctional.  I did remove the tissue and also removed the PCL.  I  then inspected the femoral component, and it looked like it had subsided  into the femur.  I did remove this easily.  We then used a drill to create a  starting hole for the distal femur.  Canal was thoroughly irrigated and the  5-degree left valgus alignment guide placed.  Referencing off the posterior  condyles, rotation is marked and the block pinned to remove 10 mm off the  distal femur.  Distal femoral resection is made with an oscillating saw.  Size 4 is the most appropriate femoral component, and the  size 4 cutting  block placed with the rotation marked at the epicondylar axis.  The  anterior, posterior, and chamfer cuts are made.   Tibia is subluxed forward.  He did have some tissue overgrowing the  posterior aspect of the tibial polyethylene component.  I removed that  tissue easily.  We removed the lateral meniscus.  The extramedullary tibial  alignment guide is placed, referencing proximally at the medial aspect of  the tibial tubercle and distally along the second metatarsal axis and tibial  crest.  We initially had the block to remove 10 mm off the nondeficient  lateral side.  I had to go down an additional 4 mm to get the base of the  tibial polyethylene out.  The resection is made with an oscillating saw.  We  then reamed the medullary canal up to 13 mm for placement of a 13 mm  cemented stem.  We thoroughly irrigated the canal.  The proximal tibia is  then prepared with the modular drill and keel punch for a size 4 MBT  revision tray.   Femoral preparation is then completed with the intercondylar  cut for the size 4 femur.   Size 4 MBT revision tray trial with a size 4 posterior stabilized femoral  trial and a 15 mm posterior stabilized rotating platform insert trial is  placed.  With the 15, full extension is achieved, but there is a tiny bit of  varus valgus placed.  We went up to 17.5, and full extension is still  achieved with excellent varus and valgus balance throughout full range of  motion.  Patella is then everted, thickness measured to be 25 mm.  Freehand  resection is taken to 15 mm, 38 template is placed, lug holes are drilled,  trial patella is placed, and it tracks normally.  The osteophytes are then  removed off  the posterior femur with the trial in place.  All trials are  removed, and then the cut bone surfaces are prepared with pulsatile lavage.  We sized for the cement restrictor, and size 4 was most appropriate, and it  is placed into the appropriate depth in the tibial canal.  Cement is mixed  and once ready for reimplantation, it is injected into the tibial canal and  pressurized.  The size 4 MBT revision tray is then cemented into place and  size 4 posterior stabilized femur and 38 patella are cemented into place and  patella is held with the clamp.  Trial 75 insert is placed, knee held in  full extension, all extruded cement removed.  Once the cement is fully  hardened, then the permanent 17.5 mm posterior stabilized rotating platform  insert is placed into the tibial tray.  The wound is copiously irrigated  with saline solution and the extensor mechanism closed over a Hemovac drain  with interrupted #1 PDS.  Flexion against gravity is 135 degrees.  Tourniquet is released for a total time of 55 minutes.  Subcu is closed with  interrupted 2-0 Vicryl, subcuticular running 4-0 Monocryl.  Incision is  cleaned and dried and Steri-Strips and a bulky sterile dressing applied.  He is then awakened and  transported to recovery in stable condition.      Ollen Gross, M.D.  Electronically Signed     FA/MEDQ  D:  11/28/2005  T:  11/29/2005  Job:  [id number]

## 2011-02-02 ENCOUNTER — Other Ambulatory Visit: Payer: Self-pay | Admitting: Oncology

## 2011-02-02 ENCOUNTER — Encounter (HOSPITAL_BASED_OUTPATIENT_CLINIC_OR_DEPARTMENT_OTHER): Payer: Medicare Other | Admitting: Oncology

## 2011-02-02 LAB — CBC & DIFF AND RETIC
BASO%: 1 % (ref 0.0–2.0)
Basophils Absolute: 0.1 10*3/uL (ref 0.0–0.1)
EOS%: 2.9 % (ref 0.0–7.0)
Eosinophils Absolute: 0.2 10*3/uL (ref 0.0–0.5)
HCT: 43.8 % (ref 38.4–49.9)
HGB: 15.8 g/dL (ref 13.0–17.1)
Immature Retic Fract: 1.5 % (ref 0.00–13.40)
LYMPH%: 37.1 % (ref 14.0–49.0)
MCH: 32.7 pg (ref 27.2–33.4)
MCHC: 36.1 g/dL — ABNORMAL HIGH (ref 32.0–36.0)
MCV: 90.7 fL (ref 79.3–98.0)
MONO#: 0.8 10*3/uL (ref 0.1–0.9)
MONO%: 12.2 % (ref 0.0–14.0)
NEUT#: 2.9 10*3/uL (ref 1.5–6.5)
NEUT%: 46.8 % (ref 39.0–75.0)
Platelets: 273 10*3/uL (ref 140–400)
RBC: 4.83 10*6/uL (ref 4.20–5.82)
RDW: 12.7 % (ref 11.0–14.6)
Retic %: 0.98 % (ref 0.50–1.60)
Retic Ct Abs: 47.33 10*3/uL (ref 24.10–77.50)
WBC: 6.2 10*3/uL (ref 4.0–10.3)
lymph#: 2.3 10*3/uL (ref 0.9–3.3)

## 2011-02-02 LAB — IRON AND TIBC
%SAT: 69 % — ABNORMAL HIGH (ref 20–55)
Iron: 190 ug/dL — ABNORMAL HIGH (ref 42–165)
TIBC: 274 ug/dL (ref 215–435)
UIBC: 84 ug/dL

## 2011-02-02 LAB — COMPREHENSIVE METABOLIC PANEL
ALT: 23 U/L (ref 0–53)
AST: 24 U/L (ref 0–37)
Albumin: 4.1 g/dL (ref 3.5–5.2)
Alkaline Phosphatase: 62 U/L (ref 39–117)
BUN: 21 mg/dL (ref 6–23)
CO2: 23 mEq/L (ref 19–32)
Calcium: 9.6 mg/dL (ref 8.4–10.5)
Chloride: 106 mEq/L (ref 96–112)
Creatinine, Ser: 1.24 mg/dL (ref 0.40–1.50)
Glucose, Bld: 105 mg/dL — ABNORMAL HIGH (ref 70–99)
Potassium: 4.5 mEq/L (ref 3.5–5.3)
Sodium: 139 mEq/L (ref 135–145)
Total Bilirubin: 1.3 mg/dL — ABNORMAL HIGH (ref 0.3–1.2)
Total Protein: 6.7 g/dL (ref 6.0–8.3)

## 2011-02-02 LAB — FERRITIN: Ferritin: 56 ng/mL (ref 22–322)

## 2011-02-02 LAB — LACTATE DEHYDROGENASE: LDH: 145 U/L (ref 94–250)

## 2011-02-02 LAB — CHCC SMEAR

## 2011-02-10 ENCOUNTER — Encounter (HOSPITAL_BASED_OUTPATIENT_CLINIC_OR_DEPARTMENT_OTHER): Payer: Medicare Other | Admitting: Oncology

## 2011-05-27 ENCOUNTER — Encounter (HOSPITAL_BASED_OUTPATIENT_CLINIC_OR_DEPARTMENT_OTHER): Payer: Medicare Other | Admitting: Oncology

## 2011-05-27 ENCOUNTER — Other Ambulatory Visit: Payer: Self-pay | Admitting: Oncology

## 2011-05-27 LAB — CBC WITH DIFFERENTIAL/PLATELET
Basophils Absolute: 0 10*3/uL (ref 0.0–0.1)
Eosinophils Absolute: 0.2 10*3/uL (ref 0.0–0.5)
HCT: 42.3 % (ref 38.4–49.9)
HGB: 15.2 g/dL (ref 13.0–17.1)
LYMPH%: 34.7 % (ref 14.0–49.0)
MCV: 94.3 fL (ref 79.3–98.0)
MONO#: 0.7 10*3/uL (ref 0.1–0.9)
MONO%: 9.4 % (ref 0.0–14.0)
NEUT#: 3.6 10*3/uL (ref 1.5–6.5)
Platelets: 239 10*3/uL (ref 140–400)
WBC: 7 10*3/uL (ref 4.0–10.3)

## 2011-07-13 ENCOUNTER — Ambulatory Visit: Payer: Medicare Other | Admitting: Oncology

## 2011-08-01 ENCOUNTER — Other Ambulatory Visit: Payer: Self-pay | Admitting: Oncology

## 2011-08-01 ENCOUNTER — Telehealth: Payer: Self-pay | Admitting: Oncology

## 2011-08-01 ENCOUNTER — Ambulatory Visit (HOSPITAL_BASED_OUTPATIENT_CLINIC_OR_DEPARTMENT_OTHER): Payer: Medicare Other

## 2011-08-01 LAB — CBC WITH DIFFERENTIAL/PLATELET
Eosinophils Absolute: 0.3 10*3/uL (ref 0.0–0.5)
HCT: 43 % (ref 38.4–49.9)
LYMPH%: 36.1 % (ref 14.0–49.0)
MCHC: 35 g/dL (ref 32.0–36.0)
MCV: 96.6 fL (ref 79.3–98.0)
MONO#: 0.7 10*3/uL (ref 0.1–0.9)
MONO%: 9.7 % (ref 0.0–14.0)
NEUT%: 50 % (ref 39.0–75.0)
Platelets: 293 10*3/uL (ref 140–400)
RBC: 4.45 10*6/uL (ref 4.20–5.82)

## 2011-08-01 NOTE — Telephone Encounter (Signed)
Called pt and informed him of appt on 12/05

## 2011-08-03 ENCOUNTER — Telehealth: Payer: Self-pay | Admitting: *Deleted

## 2011-08-03 NOTE — Telephone Encounter (Signed)
called patient at [phone] at patient requested to change appointment to 08-11-2011 starting at 11:00am with dr.rubin and then phemboly.

## 2011-08-04 ENCOUNTER — Other Ambulatory Visit: Payer: Medicare Other | Admitting: Lab

## 2011-08-08 ENCOUNTER — Ambulatory Visit: Payer: Medicare Other | Admitting: Oncology

## 2011-08-10 ENCOUNTER — Ambulatory Visit: Payer: Medicare Other | Admitting: Oncology

## 2011-08-11 ENCOUNTER — Ambulatory Visit: Payer: Medicare Other | Admitting: Oncology

## 2011-08-17 ENCOUNTER — Other Ambulatory Visit: Payer: Self-pay | Admitting: Oncology

## 2011-08-17 ENCOUNTER — Ambulatory Visit (HOSPITAL_BASED_OUTPATIENT_CLINIC_OR_DEPARTMENT_OTHER): Payer: Medicare Other

## 2011-08-17 ENCOUNTER — Other Ambulatory Visit (HOSPITAL_BASED_OUTPATIENT_CLINIC_OR_DEPARTMENT_OTHER): Payer: Medicare Other

## 2011-08-17 ENCOUNTER — Telehealth: Payer: Self-pay | Admitting: *Deleted

## 2011-08-17 ENCOUNTER — Ambulatory Visit (HOSPITAL_BASED_OUTPATIENT_CLINIC_OR_DEPARTMENT_OTHER): Payer: Medicare Other | Admitting: Oncology

## 2011-08-17 LAB — COMPREHENSIVE METABOLIC PANEL
BUN: 19 mg/dL (ref 6–23)
CO2: 24 mEq/L (ref 19–32)
Calcium: 9.3 mg/dL (ref 8.4–10.5)
Chloride: 106 mEq/L (ref 96–112)
Creatinine, Ser: 1.19 mg/dL (ref 0.50–1.35)
Glucose, Bld: 123 mg/dL — ABNORMAL HIGH (ref 70–99)

## 2011-08-17 LAB — CBC & DIFF AND RETIC
BASO%: 1.3 % (ref 0.0–2.0)
EOS%: 4.4 % (ref 0.0–7.0)
HCT: 45.6 % (ref 38.4–49.9)
LYMPH%: 36.4 % (ref 14.0–49.0)
MCH: 32.9 pg (ref 27.2–33.4)
MCHC: 35.7 g/dL (ref 32.0–36.0)
MONO#: 0.6 10*3/uL (ref 0.1–0.9)
MONO%: 9.6 % (ref 0.0–14.0)
NEUT%: 48.3 % (ref 39.0–75.0)
Platelets: 270 10*3/uL (ref 140–400)
RBC: 4.95 10*6/uL (ref 4.20–5.82)
Retic %: 1.58 % (ref 0.80–1.80)
WBC: 6.3 10*3/uL (ref 4.0–10.3)

## 2011-08-17 LAB — IRON AND TIBC
Iron: 153 ug/dL (ref 42–165)
TIBC: 264 ug/dL (ref 215–435)
UIBC: 111 ug/dL — ABNORMAL LOW (ref 125–400)

## 2011-08-17 LAB — MORPHOLOGY
PLT EST: ADEQUATE
RBC Comments: NORMAL

## 2011-08-17 LAB — FERRITIN: Ferritin: 72 ng/mL (ref 22–322)

## 2011-08-17 NOTE — Telephone Encounter (Signed)
added on doctor's appointment on to the lab and phelb appointment

## 2011-08-17 NOTE — Progress Notes (Signed)
Add on phlebotomy per Dr. Donnie Coffin today. Pt. Was seen by md this am.  No appt. scheduled. Rt. antecubital site used and phlebotomized 500g today. Pt. Tolerated procedure well.  Denied any issues. HL

## 2011-08-17 NOTE — Telephone Encounter (Signed)
added appointment with dr.rubin on 11-29-2011

## 2011-08-18 NOTE — Progress Notes (Signed)
Hematology and Oncology Follow Up Visit  Jeffery Carpenter [id number] 07/12/1941 70 y.o. 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  Interim History:  Doing well, no intercurrent problems, hospital admissions or changes in medication  Medications: I have reviewed the patient's current medications.  Allergies: No Known Allergies  Past Medical History, Surgical history, Social history, and Family History were reviewed and updated.  Review of Systems: Constitutional:  Negative for fever, chills, night sweats, anorexia, weight loss, pain. Cardiovascular: no chest pain or dyspnea on exertion Respiratory: no cough, shortness of breath, or wheezing Neurological: no TIA or stroke symptoms Dermatological: negative ENT: negative Skin Gastrointestinal: no abdominal pain, change in bowel habits, or black or bloody stools Genito-Urinary: no dysuria, trouble voiding, or hematuria Hematological and Lymphatic: negative Breast: negative Musculoskeletal: negative Remaining ROS negative.  Physical Exam: Blood pressure 149/92, pulse 75, temperature 97.6 F (36.4 C), temperature source Oral, height 5\' 8"  (1.727 m), weight 194 lb 8 oz (88.225 kg). ECOG: 0 General appearance: alert, cooperative and appears stated age Head: Normocephalic, without obvious abnormality, atraumatic Neck: no adenopathy, no carotid bruit, no JVD, supple, symmetrical, trachea midline and thyroid not enlarged, symmetric, no tenderness/mass/nodules Lymph nodes: Cervical, supraclavicular, and axillary nodes normal. Cardiac : nl Pulmonary: nl Breasts:n/a Abdomen: no hepatomegaly Extremities nl Neuro:nl  Lab Results: Lab Results  Component Value Date   WBC 6.3 08/17/2011   HGB 16.3 08/17/2011   HCT 45.6 08/17/2011   MCV 92.1 08/17/2011   PLT 270 08/17/2011     Chemistry      Component Value Date/Time   NA 140  08/17/2011 0959   NA 140 08/17/2011 0959   K 4.5 08/17/2011 0959   K 4.5 08/17/2011 0959   CL 106 08/17/2011 0959   CL 106 08/17/2011 0959   CO2 24 08/17/2011 0959   CO2 24 08/17/2011 0959   BUN 19 08/17/2011 0959   BUN 19 08/17/2011 0959   CREATININE 1.19 08/17/2011 0959   CREATININE 1.19 08/17/2011 0959      Component Value Date/Time   CALCIUM 9.3 08/17/2011 0959   CALCIUM 9.3 08/17/2011 0959   ALKPHOS 58 08/17/2011 0959   ALKPHOS 58 08/17/2011 0959   AST 26 08/17/2011 0959   AST 26 08/17/2011 0959   ALT 34 08/17/2011 0959   ALT 34 08/17/2011 0959   BILITOT 1.0 08/17/2011 0959   BILITOT 1.0 08/17/2011 0959       Radiological Studies: chest X-ray n/a Mammogram N/A Bone density n/a  Impression and Plan: [name] is doing well, hg today is 16.9, ferritin is 73.. We will continue q 3 month phlebotomy. More than 50% of the visit was spent in patient-related counselling   Pierce Crane, MD 12/13/20126:50 AM

## 2011-08-24 ENCOUNTER — Other Ambulatory Visit: Payer: Medicare Other | Admitting: Lab

## 2011-09-10 ENCOUNTER — Other Ambulatory Visit: Payer: Self-pay | Admitting: Oncology

## 2011-11-16 ENCOUNTER — Other Ambulatory Visit: Payer: Self-pay | Admitting: *Deleted

## 2011-11-16 DIAGNOSIS — IMO0001 Reserved for inherently not codable concepts without codable children: Secondary | ICD-10-CM

## 2011-11-16 DIAGNOSIS — Z136 Encounter for screening for cardiovascular disorders: Secondary | ICD-10-CM

## 2011-11-17 ENCOUNTER — Ambulatory Visit: Payer: Medicare Other | Admitting: Oncology

## 2011-11-23 ENCOUNTER — Encounter (INDEPENDENT_AMBULATORY_CARE_PROVIDER_SITE_OTHER): Payer: Medicare Other | Admitting: *Deleted

## 2011-11-23 DIAGNOSIS — Z136 Encounter for screening for cardiovascular disorders: Secondary | ICD-10-CM

## 2011-11-23 DIAGNOSIS — Z8249 Family history of ischemic heart disease and other diseases of the circulatory system: Secondary | ICD-10-CM

## 2011-11-23 DIAGNOSIS — IMO0001 Reserved for inherently not codable concepts without codable children: Secondary | ICD-10-CM

## 2011-11-29 ENCOUNTER — Other Ambulatory Visit: Payer: Medicare Other | Admitting: Lab

## 2011-11-29 ENCOUNTER — Ambulatory Visit: Payer: Medicare Other | Admitting: Oncology

## 2011-11-29 ENCOUNTER — Ambulatory Visit: Payer: Medicare Other | Admitting: Physician Assistant

## 2011-11-30 NOTE — Procedures (Unsigned)
DUPLEX ULTRASOUND OF ABDOMINAL AORTA  INDICATION:  Family history of abdominal aortic aneurysm.  HISTORY: Diabetes:  No Cardiac:  No Hypertension:  No Smoking:  No Connective Tissue Disorder: Family History:  Brother/uncle Previous Surgery:  No  DUPLEX EXAM:         AP (cm)                   TRANSVERSE (cm) Proximal             1.83 cm                   1.76 cm Mid                  1.90 cm                   2.16 cm Distal               2.03 cm                   2.29 cm Right Iliac          1.10 cm                   1.13 cm Left Iliac           1.25 cm                   1.37 cm  PREVIOUS:  Date: N/A  AP:  TRANSVERSE:  IMPRESSION:  No evidence of abdominal aortic aneurysm.  ___________________________________________ Di Kindle. Edilia Bo, M.D.  LT/MEDQ  D:  11/23/2011  T:  11/23/2011  Job:  [id number]

## 2012-01-25 ENCOUNTER — Telehealth: Payer: Self-pay | Admitting: *Deleted

## 2012-01-25 NOTE — Telephone Encounter (Signed)
Per e-mail from Jacki Cones, I have scheduled appt after MD visit.  JMW

## 2012-02-10 ENCOUNTER — Other Ambulatory Visit: Payer: Self-pay | Admitting: Physician Assistant

## 2012-02-10 ENCOUNTER — Encounter: Payer: Self-pay | Admitting: Physician Assistant

## 2012-02-10 ENCOUNTER — Telehealth: Payer: Self-pay | Admitting: *Deleted

## 2012-02-10 ENCOUNTER — Ambulatory Visit (HOSPITAL_BASED_OUTPATIENT_CLINIC_OR_DEPARTMENT_OTHER): Payer: Medicare Other | Admitting: Physician Assistant

## 2012-02-10 ENCOUNTER — Other Ambulatory Visit: Payer: Self-pay | Admitting: *Deleted

## 2012-02-10 ENCOUNTER — Other Ambulatory Visit (HOSPITAL_BASED_OUTPATIENT_CLINIC_OR_DEPARTMENT_OTHER): Payer: Medicare Other | Admitting: Lab

## 2012-02-10 ENCOUNTER — Ambulatory Visit (HOSPITAL_BASED_OUTPATIENT_CLINIC_OR_DEPARTMENT_OTHER): Payer: Medicare Other

## 2012-02-10 ENCOUNTER — Telehealth: Payer: Self-pay | Admitting: Oncology

## 2012-02-10 LAB — CBC WITH DIFFERENTIAL/PLATELET
Basophils Absolute: 0.1 10*3/uL (ref 0.0–0.1)
HCT: 43.2 % (ref 38.4–49.9)
HGB: 15.2 g/dL (ref 13.0–17.1)
LYMPH%: 34.7 % (ref 14.0–49.0)
MONO#: 0.5 10*3/uL (ref 0.1–0.9)
NEUT%: 53.3 % (ref 39.0–75.0)
Platelets: 250 10*3/uL (ref 140–400)
WBC: 5.9 10*3/uL (ref 4.0–10.3)
lymph#: 2.1 10*3/uL (ref 0.9–3.3)

## 2012-02-10 LAB — IRON AND TIBC
%SAT: 64 % — ABNORMAL HIGH (ref 20–55)
Iron: 145 ug/dL (ref 42–165)

## 2012-02-10 LAB — FERRITIN: Ferritin: 118 ng/mL (ref 22–322)

## 2012-02-10 NOTE — Progress Notes (Signed)
Hematology and Oncology Follow Up Visit  ARIQ KHAMIS [id number] [birth year] 71 y.o. 02/10/2012    HPI: Mr. Sharrar is a 71 year old [place] gentleman with a history of hemochromatosis, he undergoes phlebotomies on a every 3 month basis for the past several years.  Interim History:   Mr. Murdoch is seen today in follow pertain to his history of hemochromatosis. He admits that he missed his phlebotomy in March of 2013 due to the fact that his brother had to be hospitalized. He notes that his brother who was suffering from lung cancer died this past weekend.  He is appropriately grieving. Otherwise, he denies any unexplained fevers, chills, or night sweats. No shortness of breath, or chest pain. 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 He is aware that he needs a phlebotomy today. He has had no medication changes since his last office visit. A detailed review of systems is otherwise noncontributory as noted below.  Review of Systems: Constitutional:  no weight loss, fever, night sweats and feels well Eyes: uses glasses ENT: no complaints Cardiovascular: no chest pain or dyspnea on exertion Respiratory: no cough, shortness of breath, or wheezing Neurological: no TIA or stroke symptoms Dermatological: negative Gastrointestinal: no abdominal pain, change in bowel habits, or black or bloody stools Genito-Urinary: no dysuria, trouble voiding, or hematuria Hematological and Lymphatic: negative Breast: negative Musculoskeletal: positive for - joint pain Remaining ROS negative.   Medications:   I have reviewed the patient's current medications.  Current Outpatient Prescriptions  Medication Sig Dispense Refill  . folic acid (FOLVITE) 1 MG tablet TAKE ONE TABLET BY MOUTH EVERY DAY  30 tablet  6  . Folic Acid-Vit B6-Vit B12 (FOLBEE) 2.5-25-1 MG TABS Take 1 tablet by mouth daily.        . pravastatin (PRAVACHOL) 40 MG tablet Take 40 mg by  mouth daily.        Marland Kitchen thiamine (VITAMIN B-1) 50 MG tablet Take 50 mg by mouth daily.          Allergies: No Known Allergies  Physical Exam: Filed Vitals:   02/10/12 1103  BP: 127/87  Pulse: 69  Temp: 97.7 F (36.5 C)    Body mass index is 28.57 kg/(m^2). HEENT:  Sclerae anicteric, conjunctivae pink.  Oropharynx clear.  No mucositis or candidiasis.   Nodes:  No cervical, supraclavicular, or axillary lymphadenopathy palpated.  Lungs:  Clear to auscultation bilaterally.  No crackles, rhonchi, or wheezes.   Heart:  Regular rate and rhythm.   Abdomen:  Soft, nontender.  Positive bowel sounds.  No organomegaly or masses palpated.   Musculoskeletal:  No focal spinal tenderness to palpation.  Extremities:  Benign.  No peripheral edema or cyanosis.   Skin:  Benign.   Neuro:  Nonfocal, alert and oriented x 3.   Lab Results: Lab Results  Component Value Date   WBC 5.9 02/10/2012   HGB 15.2 02/10/2012   HCT 43.2 02/10/2012   MCV 94.5 02/10/2012   PLT 250 02/10/2012   NEUTROABS 3.1 02/10/2012     Chemistry      Component Value Date/Time   NA 140 08/17/2011 0959   NA 140 08/17/2011 0959   K 4.5 08/17/2011 0959   K 4.5 08/17/2011 0959   CL 106 08/17/2011 0959   CL 106 08/17/2011 0959   CO2 24 08/17/2011 0959   CO2 24 08/17/2011 0959   BUN 19 08/17/2011 0959   BUN 19 08/17/2011 0959   CREATININE 1.19 08/17/2011  0959   CREATININE 1.19 08/17/2011 0959      Component Value Date/Time   CALCIUM 9.3 08/17/2011 0959   CALCIUM 9.3 08/17/2011 0959   ALKPHOS 58 08/17/2011 0959   ALKPHOS 58 08/17/2011 0959   AST 26 08/17/2011 0959   AST 26 08/17/2011 0959   ALT 34 08/17/2011 0959   ALT 34 08/17/2011 0959   BILITOT 1.0 08/17/2011 0959   BILITOT 1.0 08/17/2011 0959      No results found for this basename: LABCA2    Assessment:  A 71 year old [place] gentleman with hemochromatosis on chronic phlebotomy therapy due for next phlebotomy today.  Case reviewed with Dr. Pierce Crane.   Plan:  Mr. Jaclynn Major will undergo therapeutic phlebotomy today as scheduled. We'll continue to monitor him on a three-month basis phlebotomies if indicated. Of note, today serum iron studies and serum chemistries are pending. This plan was reviewed with the patient, who voices understanding and agreement.  He knows to call with any changes or problems.    Nailani Full T, PA-C 02/10/2012 \

## 2012-02-10 NOTE — Telephone Encounter (Signed)
{

## 2012-02-10 NOTE — Telephone Encounter (Signed)
gve the pt his sept/dec 2013 appt calendar

## 2012-02-10 NOTE — Patient Instructions (Signed)
Patient aware of next appointment; discharged home with no complaints. 

## 2012-02-10 NOTE — Progress Notes (Signed)
Removed 500 cc blood, via phlebotomy; patient tolerated well; 30 minute observation; post vital signs stable.

## 2012-02-10 NOTE — Progress Notes (Signed)
Addended by: Pierce Crane on: 02/10/2012 12:23 PM   Modules accepted: Orders

## 2012-04-11 ENCOUNTER — Other Ambulatory Visit: Payer: Self-pay | Admitting: Oncology

## 2012-05-08 ENCOUNTER — Ambulatory Visit (HOSPITAL_BASED_OUTPATIENT_CLINIC_OR_DEPARTMENT_OTHER): Payer: Medicare Other

## 2012-05-08 ENCOUNTER — Other Ambulatory Visit (HOSPITAL_BASED_OUTPATIENT_CLINIC_OR_DEPARTMENT_OTHER): Payer: Medicare Other | Admitting: Lab

## 2012-05-08 ENCOUNTER — Ambulatory Visit (HOSPITAL_BASED_OUTPATIENT_CLINIC_OR_DEPARTMENT_OTHER): Payer: Medicare Other | Admitting: Family

## 2012-05-08 ENCOUNTER — Encounter: Payer: Self-pay | Admitting: Family

## 2012-05-08 LAB — CBC WITH DIFFERENTIAL/PLATELET
Basophils Absolute: 0.1 10*3/uL (ref 0.0–0.1)
Eosinophils Absolute: 0.3 10*3/uL (ref 0.0–0.5)
HGB: 16 g/dL (ref 13.0–17.1)
MCV: 95.9 fL (ref 79.3–98.0)
MONO#: 0.6 10*3/uL (ref 0.1–0.9)
MONO%: 9.1 % (ref 0.0–14.0)
NEUT#: 3.7 10*3/uL (ref 1.5–6.5)
RDW: 12.5 % (ref 11.0–14.6)
WBC: 6.5 10*3/uL (ref 4.0–10.3)

## 2012-05-08 LAB — COMPREHENSIVE METABOLIC PANEL (CC13)
ALT: 26 U/L (ref 0–55)
Albumin: 3.7 g/dL (ref 3.5–5.0)
CO2: 23 mEq/L (ref 22–29)
Calcium: 9.4 mg/dL (ref 8.4–10.4)
Chloride: 108 mEq/L — ABNORMAL HIGH (ref 98–107)
Glucose: 140 mg/dl — ABNORMAL HIGH (ref 70–99)
Potassium: 4.7 mEq/L (ref 3.5–5.1)
Sodium: 140 mEq/L (ref 136–145)
Total Bilirubin: 1 mg/dL (ref 0.20–1.20)
Total Protein: 7 g/dL (ref 6.4–8.3)

## 2012-05-08 NOTE — Patient Instructions (Signed)
Phlebotomy today.  Return in 1 month for appt with Dr. Donnie Coffin and next phlebotomy.

## 2012-05-08 NOTE — Progress Notes (Signed)
For first phlebotomy (right anticubital) after approximately 200 ml's the entire tubing from the patient to the bag clotted off.  RN d/c'd this IV.  RN attempted phlebotomy to left anticub.  After RN inserted needle, RN went to tape down tubing to hold in place. The phlebotomy needle slipped out of pt's arm.  Phlebotomy total 210 ml's.  Pt to come in in three months for next phlebotomy.  SHK

## 2012-05-08 NOTE — Patient Instructions (Signed)
Pt discharged home with instructions to call for changes, headaches, stroke-like symptoms etc.  Pt verbalized understanding.  shk

## 2012-05-08 NOTE — Progress Notes (Signed)
Post phlebotomy
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
## 2012-08-06 ENCOUNTER — Telehealth: Payer: Self-pay | Admitting: *Deleted

## 2012-08-06 NOTE — Telephone Encounter (Signed)
Per patient phone call I have canceled appts for today. The patient is sick. He will call after he is well to reschedule.   JMW

## 2012-08-06 NOTE — Telephone Encounter (Signed)
PT. HAS THE FLU. HE WILL CALL BACK TO RESCHEDULE CHEMO WHEN IS OVER THE FLU. THIS NOTE TO DR.RUBIN'S NURSE'S DESK.

## 2012-08-07 ENCOUNTER — Ambulatory Visit: Payer: Medicare Other | Admitting: Oncology

## 2012-08-07 ENCOUNTER — Other Ambulatory Visit: Payer: Medicare Other | Admitting: Lab

## 2012-08-08 ENCOUNTER — Telehealth: Payer: Self-pay | Admitting: *Deleted

## 2012-08-08 NOTE — Telephone Encounter (Signed)
Confirmed over the phone the new date and time on 08-22-2012 starting at 1:30

## 2012-08-08 NOTE — Telephone Encounter (Signed)
Per staff message and POF I have scheduled appt.  JMW  

## 2012-08-10 ENCOUNTER — Other Ambulatory Visit: Payer: Self-pay | Admitting: Oncology

## 2012-08-22 ENCOUNTER — Ambulatory Visit (HOSPITAL_BASED_OUTPATIENT_CLINIC_OR_DEPARTMENT_OTHER): Payer: Medicare Other | Admitting: Oncology

## 2012-08-22 ENCOUNTER — Ambulatory Visit (HOSPITAL_BASED_OUTPATIENT_CLINIC_OR_DEPARTMENT_OTHER): Payer: Medicare Other

## 2012-08-22 ENCOUNTER — Other Ambulatory Visit (HOSPITAL_BASED_OUTPATIENT_CLINIC_OR_DEPARTMENT_OTHER): Payer: Medicare Other | Admitting: Lab

## 2012-08-22 LAB — CBC WITH DIFFERENTIAL/PLATELET
BASO%: 1.3 % (ref 0.0–2.0)
Eosinophils Absolute: 0.3 10*3/uL (ref 0.0–0.5)
MCHC: 34.9 g/dL (ref 32.0–36.0)
MONO#: 0.6 10*3/uL (ref 0.1–0.9)
NEUT#: 3.2 10*3/uL (ref 1.5–6.5)
RBC: 4.79 10*6/uL (ref 4.20–5.82)
RDW: 13 % (ref 11.0–14.6)
WBC: 6.9 10*3/uL (ref 4.0–10.3)
lymph#: 2.7 10*3/uL (ref 0.9–3.3)

## 2012-08-22 LAB — COMPREHENSIVE METABOLIC PANEL (CC13)
ALT: 39 U/L (ref 0–55)
Albumin: 4 g/dL (ref 3.5–5.0)
Alkaline Phosphatase: 72 U/L (ref 40–150)
CO2: 27 mEq/L (ref 22–29)
Glucose: 113 mg/dl — ABNORMAL HIGH (ref 70–99)
Potassium: 4.5 mEq/L (ref 3.5–5.1)
Sodium: 144 mEq/L (ref 136–145)
Total Bilirubin: 1.15 mg/dL (ref 0.20–1.20)
Total Protein: 7.5 g/dL (ref 6.4–8.3)

## 2012-08-22 NOTE — Progress Notes (Signed)
Hematology and Oncology Follow Up Visit  GUISEPPE FLANAGAN [id number] Feb 23, 1941 71 y.o. 08/22/2012    HPI: Mr. Abair is a 71 year old [place] gentleman with a history of hemochromatosis diagnosed 2001,  he undergoes phlebotomies on a every 3 month basis for the past several years.  Interim History:   Mr. Cull is seen today in follow pertain to his history of hemochromatosis. He states he knows when it is time for phlebotomy, begins to feel "sluggish". 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 Denies unexplained fevers, chills, or night sweats. No shortness of breath, or chest pain. No medication changes since his last office visit.  A detailed review of systems is otherwise noncontributory.  Medications:   I have reviewed the patient's current medications.  Current Outpatient Prescriptions  Medication Sig Dispense Refill  . folic acid (FOLVITE) 1 MG tablet TAKE ONE TABLET BY MOUTH EVERY DAY  30 tablet  2  . pravastatin (PRAVACHOL) 40 MG tablet Take 40 mg by mouth daily.        Marland Kitchen thiamine (VITAMIN B-1) 50 MG tablet Take 50 mg by mouth daily.          Allergies: No Known Allergies  Physical Exam: Filed Vitals:   08/22/12 1443  BP: 139/94  Pulse: 71  Temp: 97.8 F (36.6 C)  Resp: 20    Body mass index is 28.16 kg/(m^2). HEENT:  Sclerae anicteric, conjunctivae pink.  Oropharynx clear.  No mucositis or candidiasis.   Nodes:  No cervical, supraclavicular, or axillary lymphadenopathy palpated.  Lungs:  Clear to auscultation bilaterally.  No crackles, rhonchi, or wheezes.   Heart:  Regular rate and rhythm.   Abdomen:  Soft, nontender.  Positive bowel sounds.  No organomegaly or masses palpated.   Musculoskeletal:  No focal spinal tenderness to palpation.  Extremities:  Benign.  No peripheral edema or cyanosis.   Skin:  Benign.   Neuro:  Nonfocal, alert and oriented x 3.   Lab Results: Lab Results  Component Value Date   WBC 6.9 08/22/2012   HGB 16.2  08/22/2012   HCT 46.3 08/22/2012   MCV 96.6 08/22/2012   PLT 306 08/22/2012   NEUTROABS 3.2 08/22/2012    Assessment:  72 year old [place] gentleman with hemochromatosis on chronic phlebotomy therapy, due for phlebotomy today.     Plan:  1. Phlebotomy today, hct 46.3.  Note rising ferritin to 120,  05/08/12, plan to recheck today and consider monthly phlebotomies till ferritin less than 100   Niko Penson, md 08/22/2012 \

## 2012-08-22 NOTE — Progress Notes (Signed)
Pt ambulatory on dc, no complaints voiced/   ,dmr

## 2012-08-22 NOTE — Progress Notes (Signed)
Phlebotomy preformed 516g removed over 6 minutes from rt ac.  . Pt tolerated well.  Gave snack of sandwich, chips and soda afterward.  .  dmr

## 2012-08-23 ENCOUNTER — Other Ambulatory Visit: Payer: Self-pay | Admitting: Oncology

## 2012-08-23 ENCOUNTER — Telehealth: Payer: Self-pay | Admitting: *Deleted

## 2012-08-23 NOTE — Telephone Encounter (Signed)
Gave patient appointment for 10-01-2012   [facility] email to set up phe

## 2012-08-23 NOTE — Telephone Encounter (Signed)
Per staff message and POF I have scheduled appts.  JMW  

## 2012-08-30 ENCOUNTER — Telehealth: Payer: Self-pay | Admitting: *Deleted

## 2012-08-30 NOTE — Telephone Encounter (Signed)
Patient has been informed about the md change

## 2012-10-01 ENCOUNTER — Telehealth: Payer: Self-pay | Admitting: *Deleted

## 2012-10-01 ENCOUNTER — Other Ambulatory Visit: Payer: Self-pay | Admitting: Oncology

## 2012-10-01 ENCOUNTER — Ambulatory Visit (HOSPITAL_BASED_OUTPATIENT_CLINIC_OR_DEPARTMENT_OTHER): Payer: Medicare Other | Admitting: Oncology

## 2012-10-01 ENCOUNTER — Other Ambulatory Visit (HOSPITAL_BASED_OUTPATIENT_CLINIC_OR_DEPARTMENT_OTHER): Payer: Medicare Other | Admitting: Lab

## 2012-10-01 ENCOUNTER — Telehealth: Payer: Self-pay | Admitting: Oncology

## 2012-10-01 LAB — CBC WITH DIFFERENTIAL/PLATELET
BASO%: 1 % (ref 0.0–2.0)
EOS%: 3.1 % (ref 0.0–7.0)
LYMPH%: 39.2 % (ref 14.0–49.0)
MCHC: 35.1 g/dL (ref 32.0–36.0)
MCV: 94.7 fL (ref 79.3–98.0)
MONO%: 10 % (ref 0.0–14.0)
Platelets: 242 10*3/uL (ref 140–400)
RBC: 4.67 10*6/uL (ref 4.20–5.82)
RDW: 12.8 % (ref 11.0–14.6)

## 2012-10-01 LAB — FERRITIN: Ferritin: 108 ng/mL (ref 22–322)

## 2012-10-01 LAB — IRON AND TIBC: Iron: 190 ug/dL — ABNORMAL HIGH (ref 42–165)

## 2012-10-01 NOTE — Progress Notes (Signed)
Message from Dr. Timoteo Expose nurse Marland KitchenWilliam Hamburger), pt. will be rescheduled for this Thursday or Friday for phlebotomy.  Will not be phlebotomized today. HL

## 2012-10-01 NOTE — Progress Notes (Signed)
Hematology and Oncology Follow Up Visit  Jeffery Carpenter [id number] 1941-03-02 72 y.o. 10/01/2012 5:52 PM   Principle Diagnosis: Encounter Diagnosis  Name Primary?  . Hemochromatosis Yes     Interim History:  I will be assuming the hematology care of this patient since Dr. Caron Presume left the practice.  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 He was diagnosed back in 2001 and full records not available at time of this dictation. He was diagnosed at the time that his brother was also found to have a hemachromatosis. He tells me that his initial ferritin was 883 units. He was initially on a weekly phlebotomy program and then over time on a every 3 month maintenance program. Most recent phlebotomy done 08/22/2012. In general his ferritins run less than 100. Most recent ferritin done on 05/08/2012 was 120 and he attributes this to missing one scheduled phlebotomy. He has never had clinical hemachromatosis. His brother however , died of complications of  hemachromatosis in June 2013 at age 21. Another brother died at age 59 of lung cancer. His liver functions have been normal.  It is presumed that both of his parents were heterozygous for the hemachromatosis gene. His father had colon cancer but died of a heart attack. His mother lived to age 53 and then developed colon cancer. He has 2 children a son 58 and a daughter 54 who are obligate heterozygotes.  He has chronic arthritic problems with his knees and had a left total knee replacement and right knee microsurgery. He is also had surgery on his right hand. He is a chronic, paresthesias, of his feet for over 30 years. He is getting stiffness in the joints of his hands.  Past history also includes benign tubular adenomas of the colon with most recent colonoscopy 07/30/2010. He has had bilateral cataract surgery, appendectomy, and a tonsillectomy in addition to the surgeries noted above. He denies  hypertension, MI, diabetes, ulcers, asthma, emphysema, tuberculosis, kidney stones, thyroid trouble, seizure, stroke, or blood clots. No prostate problems.  Medications: reviewed: Pravachol 40 mg daily, folic acid 1 mg daily, thiamine 50 mg daily.  Allergies: No Known Allergies  Review of Systems: Constitutional:   No constitutional symptoms Respiratory: No cough or dyspnea Cardiovascular:  No chest pain or palpitations Gastrointestinal: No abdominal pain or change in bowel habit Genito-Urinary: No urinary tract symptoms Musculoskeletal see above Neurologic: No headache or change in vision, "numb toes" for over 30 years Skin: No rash or ecchymosis Remaining ROS negative.  Physical Exam: Blood pressure 135/90, pulse 76, temperature 97.8 F (36.6 C), temperature source Oral, resp. rate 20, height 5\' 8"  (1.727 m), weight 187 lb 3.2 oz (84.913 kg). Wt Readings from Last 3 Encounters:  10/01/12 187 lb 3.2 oz (84.913 kg)  08/22/12 185 lb 3.2 oz (84.006 kg)  05/08/12 186 lb 4.8 oz (84.505 kg)     General appearance: Thin, Caucasian man HENNT: Pharynx no erythema or exudate Lymph nodes: No adenopathy Breasts: Lungs: Clear to auscultation resonant to percussion Heart: Regular rhythm no murmur Abdomen: Soft, nontender, no mass, no organomegaly Extremities: No edema, no calf tenderness Vascular: No cyanosis Neurologic: Alert and oriented, PERRLA, I could not get a good look at his optic discs, motor strength is 5 over 5, reflexes 1+ symmetric. Skin: No bronzing, no hyperpigmentation in skin folds.  Lab Results: Lab Results  Component Value Date   WBC 5.6 10/01/2012   HGB 15.5 10/01/2012   HCT 44.2  10/01/2012   MCV 94.7 10/01/2012   PLT 242 10/01/2012     Chemistry      Component Value Date/Time   NA 144 08/22/2012 1340   NA 140 08/17/2011 0959   NA 140 08/17/2011 0959   K 4.5 08/22/2012 1340   K 4.5 08/17/2011 0959   K 4.5 08/17/2011 0959   CL 104 08/22/2012 1340   CL 106  08/17/2011 0959   CL 106 08/17/2011 0959   CO2 27 08/22/2012 1340   CO2 24 08/17/2011 0959   CO2 24 08/17/2011 0959   BUN 19.0 08/22/2012 1340   BUN 19 08/17/2011 0959   BUN 19 08/17/2011 0959   CREATININE 1.2 08/22/2012 1340   CREATININE 1.19 08/17/2011 0959   CREATININE 1.19 08/17/2011 0959      Component Value Date/Time   CALCIUM 9.6 08/22/2012 1340   CALCIUM 9.3 08/17/2011 0959   CALCIUM 9.3 08/17/2011 0959   ALKPHOS 72 08/22/2012 1340   ALKPHOS 58 08/17/2011 0959   ALKPHOS 58 08/17/2011 0959   AST 23 08/22/2012 1340   AST 26 08/17/2011 0959   AST 26 08/17/2011 0959   ALT 39 08/22/2012 1340   ALT 34 08/17/2011 0959   ALT 34 08/17/2011 0959   BILITOT 1.15 08/22/2012 1340   BILITOT 1.0 08/17/2011 0959   BILITOT 1.0 08/17/2011 0959       Radiological Studies: No results found.  Impression and Plan: #1. 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 He would like to have her procedure today. He would angle back on his routine every 3 month schedule.  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 Dr. Karie Soda, MD 1/27/20145:52 PM

## 2012-10-01 NOTE — Telephone Encounter (Signed)
lvm for pt regarding 1.31.14 appt

## 2012-10-01 NOTE — Telephone Encounter (Signed)
Per staff message and POF I have scheduled appts.  JMW  

## 2012-10-01 NOTE — Patient Instructions (Signed)
Phlebotomy this Thursday 10/04/12 then back on every 3 month schedule: next 12/31/12 MD visit in 1 year on 09/29/13

## 2012-10-05 ENCOUNTER — Ambulatory Visit (HOSPITAL_BASED_OUTPATIENT_CLINIC_OR_DEPARTMENT_OTHER): Payer: Medicare Other

## 2012-10-05 NOTE — Progress Notes (Signed)
Phlebotomy performed via right antecubital vein. 500 cc blood removed. Patient tolerated procedure well. Patient offered snack and drink and observed for 30 minutes post procedure.

## 2012-10-05 NOTE — Patient Instructions (Addendum)

## 2012-10-29 ENCOUNTER — Telehealth: Payer: Self-pay | Admitting: Oncology

## 2012-10-29 ENCOUNTER — Other Ambulatory Visit: Payer: Self-pay | Admitting: *Deleted

## 2012-10-29 ENCOUNTER — Other Ambulatory Visit: Payer: Self-pay | Admitting: Oncology

## 2012-10-29 NOTE — Telephone Encounter (Signed)
Per staff message and POF I have scheduled appts.  JMW  

## 2012-11-02 ENCOUNTER — Other Ambulatory Visit: Payer: Self-pay | Admitting: Oncology

## 2012-11-12 ENCOUNTER — Other Ambulatory Visit: Payer: Self-pay | Admitting: Oncology

## 2012-12-31 ENCOUNTER — Ambulatory Visit (HOSPITAL_BASED_OUTPATIENT_CLINIC_OR_DEPARTMENT_OTHER): Payer: Medicare Other | Admitting: Oncology

## 2012-12-31 ENCOUNTER — Other Ambulatory Visit (HOSPITAL_BASED_OUTPATIENT_CLINIC_OR_DEPARTMENT_OTHER): Payer: Medicare Other | Admitting: Lab

## 2012-12-31 ENCOUNTER — Telehealth: Payer: Self-pay | Admitting: Oncology

## 2012-12-31 ENCOUNTER — Ambulatory Visit (HOSPITAL_BASED_OUTPATIENT_CLINIC_OR_DEPARTMENT_OTHER): Payer: Medicare Other

## 2012-12-31 LAB — CBC WITH DIFFERENTIAL/PLATELET
Basophils Absolute: 0.1 10*3/uL (ref 0.0–0.1)
Eosinophils Absolute: 0.2 10*3/uL (ref 0.0–0.5)
HCT: 44.1 % (ref 38.4–49.9)
HGB: 15.2 g/dL (ref 13.0–17.1)
MCV: 94 fL (ref 79.3–98.0)
MONO%: 8.9 % (ref 0.0–14.0)
NEUT#: 3.7 10*3/uL (ref 1.5–6.5)
NEUT%: 56 % (ref 39.0–75.0)
Platelets: 249 10*3/uL (ref 140–400)
RDW: 12.4 % (ref 11.0–14.6)

## 2012-12-31 NOTE — Telephone Encounter (Signed)
gv and printed appt sched and avs to pt for July, Oct, and Jan...emailed MB to add tx.Marland KitchenMarland KitchenMarland Kitchen

## 2012-12-31 NOTE — Progress Notes (Signed)
Phlebotomy 487g, removed pt tolerated well.  Snack provided after phlebotomy.  Pt ambulatory on dc.   dmr

## 2012-12-31 NOTE — Patient Instructions (Addendum)
Therapeutic Phlebotomy Therapeutic phlebotomy is the controlled removal of blood from your body for the purpose of treating a medical condition. It is similar to donating blood. Usually, about a pint (470 mL) of blood is removed. The average adult has 9 to 12 pints (4.3 to 5.7 L) of blood. Therapeutic phlebotomy may be used to treat the following medical conditions:  Hemochromatosis. This is a condition in which there is too much iron in the blood.  Polycythemia vera. This is a condition in which there are too many red cells in the blood.  Porphyria cutanea tarda. This is a disease usually passed from one generation to the next (inherited). It is a condition in which an important part of hemoglobin is not made properly. This results in the build up of abnormal amounts of porphyrins in the body.  Sickle cell disease. This is an inherited disease. It is a condition in which the red blood cells form an abnormal crescent shape rather than a round shape. LET YOUR CAREGIVER KNOW ABOUT:  Allergies.  Medicines taken including herbs, eyedrops, over-the-counter medicines, and creams.  Use of steroids (by mouth or creams).  Previous problems with anesthetics or numbing medicine.  History of blood clots.  History of bleeding or blood problems.  Previous surgery.  Possibility of pregnancy, if this applies. RISKS AND COMPLICATIONS This is a simple and safe procedure. Problems are unlikely. However, problems can occur and may include:  Nausea or lightheadedness.  Low blood pressure.  Soreness, bleeding, swelling, or bruising at the needle insertion site.  Infection. BEFORE THE PROCEDURE  This is a procedure that can be done as an outpatient. Confirm the time that you need to arrive for your procedure. Confirm whether there is a need to fast or withhold any medications. It is helpful to wear clothing with sleeves that can be raised above the elbow. A blood sample may be done to determine the  amount of red blood cells or iron in your blood. Plan ahead of time to have someone drive you home after the procedure. PROCEDURE The entire procedure from preparation through recovery takes about 1 hour. The actual collection takes about 10 to 15 minutes.  A needle will be inserted into your vein.  Tubing and a collection bag will be attached to that needle.  Blood will flow through the needle and tubing into the collection bag.  You may be asked to open and close your hand slowly and continuously during the entire collection.  Once the specified amount of blood has been removed from your body, the collection bag and tubing will be clamped.  The needle will be removed.  Pressure will be held on the site of the needle insertion to stop the bleeding. Then a bandage will be placed over the needle insertion site. AFTER THE PROCEDURE  Your recovery will be assessed and monitored. If there are no problems, as an outpatient, you should be able to go home shortly after the procedure.  Document Released: 01/24/2011 Document Revised: 11/14/2011 Document Reviewed: 01/24/2011 [employer] Patient Information 2013 [employer].  

## 2012-12-31 NOTE — Progress Notes (Signed)
Hematology and Oncology Follow Up Visit  Jeffery Carpenter [id number] 1940-12-04 72 y.o. 12/31/2012 6:22 PM   Principle Diagnosis: Encounter Diagnosis  Name Primary?  . Hemochromatosis Yes     Interim History:   Followup visit for this 72 year old man with long-standing hemochromatosis. 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 He was initially treated with weekly phlebotomy and now is on an every three-month maintenance program. Serum ferritin levels have been maintained at less than 150. Liver functions have been normal. Please see my clinical summary note dated 10/01/1998 1440 this details.  He is doing well at this time. He has had no interim medical problems. Most recent ferritin 108 done at time of last visit here on 10/01/2012. He received a phlebotomy that day.  Medications: reviewed  Allergies: No Known Allergies  Review of Systems: Constitutional:   No constitutional symptoms Respiratory: No cough or dyspnea Cardiovascular:  No chest pain or palpitations Gastrointestinal: No abdominal pain or change in bowel habit Genito-Urinary: No urinary tract symptoms Musculoskeletal: No muscle or bone pain Neurologic: No headache or change in vision Skin: No rash or ecchymosis Remaining ROS negative.  Physical Exam: Blood pressure 134/83, pulse 83, temperature 97.6 F (36.4 C), temperature source Oral, resp. rate 18, height 5\' 8"  (1.727 m), weight 185 lb 3.2 oz (84.006 kg). Wt Readings from Last 3 Encounters:  12/31/12 185 lb 3.2 oz (84.006 kg)  10/01/12 187 lb 3.2 oz (84.913 kg)  08/22/12 185 lb 3.2 oz (84.006 kg)     General appearance: Well-nourished Caucasian man HENNT: Pharynx no erythema or exudate Lymph nodes: No adenopathy Breasts: Lungs: Clear to auscultation resonant to percussion Heart: Regular rhythm no murmur Abdomen: Soft, nontender, no mass, no organomegaly Extremities: No edema, no calf tenderness Vascular: No cyanosis Neurologic: Motor strength 5 over 5,  reflexes 1+ symmetric Skin: No rash or ecchymosis  Lab Results: Lab Results  Component Value Date   WBC 6.6 12/31/2012   HGB 15.2 12/31/2012   HCT 44.1 12/31/2012   MCV 94.0 12/31/2012   PLT 249 12/31/2012     Chemistry      Component Value Date/Time   NA 144 08/22/2012 1340   NA 140 08/17/2011 0959   K 4.5 08/22/2012 1340   K 4.5 08/17/2011 0959   CL 104 08/22/2012 1340   CL 106 08/17/2011 0959   CO2 27 08/22/2012 1340   CO2 24 08/17/2011 0959   BUN 19.0 08/22/2012 1340   BUN 19 08/17/2011 0959   CREATININE 1.2 08/22/2012 1340   CREATININE 1.19 08/17/2011 0959      Component Value Date/Time   CALCIUM 9.6 08/22/2012 1340   CALCIUM 9.3 08/17/2011 0959   ALKPHOS 72 08/22/2012 1340   ALKPHOS 58 08/17/2011 0959   AST 23 08/22/2012 1340   AST 26 08/17/2011 0959   ALT 39 08/22/2012 1340   ALT 34 08/17/2011 0959   BILITOT 1.15 08/22/2012 1340   BILITOT 1.0 08/17/2011 0959       Impression and Plan: #1. A homozygote for the major hemochromatosis gene. Stable on every 3 month phlebotomy program. Plan: Continue the same  #2. 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 Dr. Karie Soda, MD 4/28/20146:22 PM

## 2013-02-12 ENCOUNTER — Other Ambulatory Visit: Payer: Self-pay | Admitting: *Deleted

## 2013-02-12 MED ORDER — FOLIC ACID 1 MG PO TABS: ORAL_TABLET | ORAL | Status: DC

## 2013-04-01 ENCOUNTER — Other Ambulatory Visit: Payer: Self-pay | Admitting: Oncology

## 2013-04-01 ENCOUNTER — Ambulatory Visit (HOSPITAL_BASED_OUTPATIENT_CLINIC_OR_DEPARTMENT_OTHER): Payer: Medicare Other

## 2013-04-01 ENCOUNTER — Other Ambulatory Visit (HOSPITAL_BASED_OUTPATIENT_CLINIC_OR_DEPARTMENT_OTHER): Payer: Medicare Other | Admitting: Lab

## 2013-04-01 LAB — CBC WITH DIFFERENTIAL/PLATELET
BASO%: 1 % (ref 0.0–2.0)
EOS%: 4.4 % (ref 0.0–7.0)
HCT: 43.9 % (ref 38.4–49.9)
MCH: 33.5 pg — ABNORMAL HIGH (ref 27.2–33.4)
MCHC: 35.1 g/dL (ref 32.0–36.0)
MCV: 95.5 fL (ref 79.3–98.0)
MONO%: 10 % (ref 0.0–14.0)
NEUT%: 49.9 % (ref 39.0–75.0)
RDW: 12.8 % (ref 11.0–14.6)
lymph#: 2.2 10*3/uL (ref 0.9–3.3)

## 2013-04-01 NOTE — Progress Notes (Signed)
Dr. Cyndie Chime aware of today's labs, ferritin pending; okay to proceed with therapeutic phlebotomy, per MD. Patient given cheese, crackers, and sprite prior to procedure. 588 grams blood obtained from R AC site, patient tolerated well; dressing applied. Patient monitored x30 minutes post phlebotomy without any intervention needed. Patient alert, stable.

## 2013-04-01 NOTE — Patient Instructions (Addendum)
Therapeutic Phlebotomy Therapeutic phlebotomy is the controlled removal of blood from your body for the purpose of treating a medical condition. It is similar to donating blood. Usually, about a pint (470 mL) of blood is removed. The average adult has 9 to 12 pints (4.3 to 5.7 L) of blood. Therapeutic phlebotomy may be used to treat the following medical conditions:  Hemochromatosis. This is a condition in which there is too much iron in the blood.  Polycythemia vera. This is a condition in which there are too many red cells in the blood.  Porphyria cutanea tarda. This is a disease usually passed from one generation to the next (inherited). It is a condition in which an important part of hemoglobin is not made properly. This results in the build up of abnormal amounts of porphyrins in the body.  Sickle cell disease. This is an inherited disease. It is a condition in which the red blood cells form an abnormal crescent shape rather than a round shape. LET YOUR CAREGIVER KNOW ABOUT:  Allergies.  Medicines taken including herbs, eyedrops, over-the-counter medicines, and creams.  Use of steroids (by mouth or creams).  Previous problems with anesthetics or numbing medicine.  History of blood clots.  History of bleeding or blood problems.  Previous surgery.  Possibility of pregnancy, if this applies. RISKS AND COMPLICATIONS This is a simple and safe procedure. Problems are unlikely. However, problems can occur and may include:  Nausea or lightheadedness.  Low blood pressure.  Soreness, bleeding, swelling, or bruising at the needle insertion site.  Infection. BEFORE THE PROCEDURE  This is a procedure that can be done as an outpatient. Confirm the time that you need to arrive for your procedure. Confirm whether there is a need to fast or withhold any medications. It is helpful to wear clothing with sleeves that can be raised above the elbow. A blood sample may be done to determine the  amount of red blood cells or iron in your blood. Plan ahead of time to have someone drive you home after the procedure. PROCEDURE The entire procedure from preparation through recovery takes about 1 hour. The actual collection takes about 10 to 15 minutes.  A needle will be inserted into your vein.  Tubing and a collection bag will be attached to that needle.  Blood will flow through the needle and tubing into the collection bag.  You may be asked to open and close your hand slowly and continuously during the entire collection.  Once the specified amount of blood has been removed from your body, the collection bag and tubing will be clamped.  The needle will be removed.  Pressure will be held on the site of the needle insertion to stop the bleeding. Then a bandage will be placed over the needle insertion site. AFTER THE PROCEDURE  Your recovery will be assessed and monitored. If there are no problems, as an outpatient, you should be able to go home shortly after the procedure.  Document Released: 01/24/2011 Document Revised: 11/14/2011 Document Reviewed: 01/24/2011 [employer] Patient Information 2014 [employer].  

## 2013-07-01 ENCOUNTER — Other Ambulatory Visit (HOSPITAL_BASED_OUTPATIENT_CLINIC_OR_DEPARTMENT_OTHER): Payer: Medicare Other | Admitting: Lab

## 2013-07-01 ENCOUNTER — Telehealth: Payer: Self-pay | Admitting: *Deleted

## 2013-07-01 ENCOUNTER — Telehealth: Payer: Self-pay | Admitting: Oncology

## 2013-07-01 ENCOUNTER — Ambulatory Visit (HOSPITAL_BASED_OUTPATIENT_CLINIC_OR_DEPARTMENT_OTHER): Payer: Medicare Other

## 2013-07-01 ENCOUNTER — Ambulatory Visit (HOSPITAL_BASED_OUTPATIENT_CLINIC_OR_DEPARTMENT_OTHER): Payer: Medicare Other | Admitting: Oncology

## 2013-07-01 ENCOUNTER — Encounter (INDEPENDENT_AMBULATORY_CARE_PROVIDER_SITE_OTHER): Payer: Self-pay

## 2013-07-01 ENCOUNTER — Encounter: Payer: Self-pay | Admitting: Oncology

## 2013-07-01 LAB — CBC WITH DIFFERENTIAL/PLATELET
BASO%: 1.1 % (ref 0.0–2.0)
EOS%: 4.5 % (ref 0.0–7.0)
Eosinophils Absolute: 0.3 10*3/uL (ref 0.0–0.5)
LYMPH%: 31.7 % (ref 14.0–49.0)
MCHC: 34.3 g/dL (ref 32.0–36.0)
MCV: 94.2 fL (ref 79.3–98.0)
MONO%: 8.9 % (ref 0.0–14.0)
NEUT#: 3.6 10*3/uL (ref 1.5–6.5)
RBC: 4.64 10*6/uL (ref 4.20–5.82)
RDW: 12.7 % (ref 11.0–14.6)

## 2013-07-01 LAB — FERRITIN CHCC: Ferritin: 85 ng/ml (ref 22–316)

## 2013-07-01 NOTE — Progress Notes (Signed)
Hematology and Oncology Follow Up Visit  Jeffery Carpenter [id number] [birth year] 72 y.o. 07/01/2013 8:15 PM   Principle Diagnosis: Encounter Diagnosis  Name Primary?  . Hemochromatosis Yes     Interim History:    Follow up visit for this 72 year old male who is homozygous for the C282Y Hemachromatosis gene by his history. He was diagnosed back in 2001. full records not available . He was diagnosed at the time that his brother was also found to have a hemachromatosis. He tells me that his initial ferritin was 883 units. He was initially on a weekly phlebotomy program and then over time on a every 3 month maintenance program. In general his ferritins run less than 100. Marland Kitchen  He has never had clinical hemachromatosis. His brother however , died of complications of hemachromatosis in June 2013 at age 67. Another brother died at age 31 of lung cancer.  His liver functions have been normal. He has had no interim medical problems. He is increasingly frustrated by medical charges and believes that he has 3 co-pays associated with the physician visit, the laboratory, and the phlebotomy procedure. [facility] these [facility].  Medications: reviewed  Allergies: No Known Allergies  Review of Systems: Hematology: negative for swollen glands, easy bruising,  ENT ROS: negative for - oral lesions or sore throat Breast ROS:  Respiratory ROS: negative for - cough, pleuritic pain, shortness of breath or wheezing Cardiovascular ROS: negative for - chest pain, dyspnea on exertion, edema, irregular heartbeat, murmur, orthopnea, palpitations, paroxysmal nocturnal dyspnea or rapid heart rate Gastrointestinal ROS: negative for - abdominal pain, appetite loss, blood in stools, change in bowel habits, constipation, diarrhea, heartburn, hematemesis, melena, nausea/vomiting or swallowing difficulty/pain Genito-Urinary ROS: negative for -  dysuria, hematuria,  incontinence,  nocturia or urinary frequency/urgency Musculoskeletal ROS: Some chronic arthritis pain in his knees. Status post left total knee replacement. Right knee microsurgery.  Neurological ROS: negative for - behavioral changes, confusion, dizziness, gait disturbance, headaches, impaired coordination/balance, memory loss, positive low level paresthesias of his feet which are chronic.  Dermatological ROS: negative for rash, ecchymosis Remaining ROS negative.  Physical Exam: Blood pressure 151/104, pulse 68, temperature 97.3 F (36.3 C), temperature source Oral, resp. rate 18, height 5\' 8"  (1.727 m), weight 189 lb 9.6 oz (86.002 kg), SpO2 98.00%. Wt Readings from Last 3 Encounters:  07/01/13 189 lb 9.6 oz (86.002 kg)  12/31/12 185 lb 3.2 oz (84.006 kg)  10/01/12 187 lb 3.2 oz (84.913 kg)     General appearance: Well-nourished Caucasian man HENNT: Pharynx no erythema, exudate, mass, or ulcer. No thyromegaly or thyroid nodules Lymph nodes: No cervical, supraclavicular, or axillary lymphadenopathy Breasts:  Lungs: Clear to auscultation, resonant to percussion throughout Heart: Regular rhythm, no murmur, no gallop, no rub, no click, no edema Abdomen: Soft, nontender, normal bowel sounds, no mass, no organomegaly Extremities: No edema, no calf tenderness Musculoskeletal: no joint deformities GU: Vascular: Carotid pulses 2+, no bruits, Neurologic: Alert, oriented, PERRLA, optic discs sharp and vessels normal, no hemorrhage or exudate, cranial nerves grossly normal, motor strength 5 over 5, reflexes 1+ symmetric, upper body coordination normal, gait normal, Skin: No rash or ecchymosis  Lab Results: CBC W/Diff    Component Value Date/Time   WBC 6.6 07/01/2013 1155   RBC 4.64 07/01/2013 1155   HGB 15.0 07/01/2013 1155   HCT 43.7 07/01/2013 1155   PLT 235 07/01/2013 1155   MCV 94.2 07/01/2013 1155  MCH 32.3 07/01/2013 1155   MCHC 34.3 07/01/2013 1155   RDW 12.7 07/01/2013 1155    LYMPHSABS 2.1 07/01/2013 1155   MONOABS 0.6 07/01/2013 1155   EOSABS 0.3 07/01/2013 1155   BASOSABS 0.1 07/01/2013 1155     Chemistry      Component Value Date/Time   NA 144 08/22/2012 1340   NA 140 08/17/2011 0959   K 4.5 08/22/2012 1340   K 4.5 08/17/2011 0959   CL 104 08/22/2012 1340   CL 106 08/17/2011 0959   CO2 27 08/22/2012 1340   CO2 24 08/17/2011 0959   BUN 19.0 08/22/2012 1340   BUN 19 08/17/2011 0959   CREATININE 1.2 08/22/2012 1340   CREATININE 1.19 08/17/2011 0959      Component Value Date/Time   CALCIUM 9.6 08/22/2012 1340   CALCIUM 9.3 08/17/2011 0959   ALKPHOS 72 08/22/2012 1340   ALKPHOS 58 08/17/2011 0959   AST 23 08/22/2012 1340   AST 26 08/17/2011 0959   ALT 39 08/22/2012 1340   ALT 34 08/17/2011 0959   BILITOT 1.15 08/22/2012 1340   BILITOT 1.0 08/17/2011 0959    Ferritin: 85    Impression:  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   CC: Patient Care Team: Lupe Carney, MD as PCP - General (Family Medicine)   Levert Feinstein, MD 10/27/20148:15 PM

## 2013-07-01 NOTE — Progress Notes (Signed)
Nurse came in to advised the patient is upset that he gets bill for dr, lab and philebotomy. I advised I would send to billing for confirmation and have them call the patient. He doesn't want to come if he has to pay copays for all 3?? I advised her to let him know that lab is billed separate, so our copay and lab is probably correct, but will have them check. He said [employer] said it was coded incorrectly.

## 2013-07-01 NOTE — Progress Notes (Signed)
Patient has not eaten lunch yet today. Eating ham sandwich and drinking sprite prior to phlebotomy.  Phlebotomy performed without difficulty to L AC; removed 539 g of blood in 1 attempt; patient remained stable throughout and was monitored x30 minutes prior to discharge; food and drink provided. VSS, pt AAO.

## 2013-07-01 NOTE — Telephone Encounter (Signed)
Gave pt appt for lab,MD  and phelbotomy for January 2015

## 2013-07-01 NOTE — Patient Instructions (Signed)
Therapeutic Phlebotomy Therapeutic phlebotomy is the controlled removal of blood from your body for the purpose of treating a medical condition. It is similar to donating blood. Usually, about a pint (470 mL) of blood is removed. The average adult has 9 to 12 pints (4.3 to 5.7 L) of blood. Therapeutic phlebotomy may be used to treat the following medical conditions:  Hemochromatosis. This is a condition in which there is too much iron in the blood.  Polycythemia vera. This is a condition in which there are too many red cells in the blood.  Porphyria cutanea tarda. This is a disease usually passed from one generation to the next (inherited). It is a condition in which an important part of hemoglobin is not made properly. This results in the build up of abnormal amounts of porphyrins in the body.  Sickle cell disease. This is an inherited disease. It is a condition in which the red blood cells form an abnormal crescent shape rather than a round shape. LET YOUR CAREGIVER KNOW ABOUT:  Allergies.  Medicines taken including herbs, eyedrops, over-the-counter medicines, and creams.  Use of steroids (by mouth or creams).  Previous problems with anesthetics or numbing medicine.  History of blood clots.  History of bleeding or blood problems.  Previous surgery.  Possibility of pregnancy, if this applies. RISKS AND COMPLICATIONS This is a simple and safe procedure. Problems are unlikely. However, problems can occur and may include:  Nausea or lightheadedness.  Low blood pressure.  Soreness, bleeding, swelling, or bruising at the needle insertion site.  Infection. BEFORE THE PROCEDURE  This is a procedure that can be done as an outpatient. Confirm the time that you need to arrive for your procedure. Confirm whether there is a need to fast or withhold any medications. It is helpful to wear clothing with sleeves that can be raised above the elbow. A blood sample may be done to determine the  amount of red blood cells or iron in your blood. Plan ahead of time to have someone drive you home after the procedure. PROCEDURE The entire procedure from preparation through recovery takes about 1 hour. The actual collection takes about 10 to 15 minutes.  A needle will be inserted into your vein.  Tubing and a collection bag will be attached to that needle.  Blood will flow through the needle and tubing into the collection bag.  You may be asked to open and close your hand slowly and continuously during the entire collection.  Once the specified amount of blood has been removed from your body, the collection bag and tubing will be clamped.  The needle will be removed.  Pressure will be held on the site of the needle insertion to stop the bleeding. Then a bandage will be placed over the needle insertion site. AFTER THE PROCEDURE  Your recovery will be assessed and monitored. If there are no problems, as an outpatient, you should be able to go home shortly after the procedure.  Document Released: 01/24/2011 Document Revised: 11/14/2011 Document Reviewed: 01/24/2011 [employer] Patient Information 2014 [employer].  

## 2013-07-01 NOTE — Telephone Encounter (Signed)
Per staff message and POF I have scheduled appts.  JMW  

## 2013-07-04 ENCOUNTER — Telehealth: Payer: Self-pay | Admitting: Oncology

## 2013-07-04 NOTE — Telephone Encounter (Signed)
Talked to pt and gave her appt for November and December 2014 lab ,MD and chemo

## 2013-07-09 ENCOUNTER — Encounter: Payer: Self-pay | Admitting: Oncology

## 2013-07-09 NOTE — Progress Notes (Signed)
°  Wanted to let you know that we have been trying to reach patient to discuss his CP/ bill with him but we have only been able to leave a message and he is not returning our calls.  IN case he stops by the office ! This note from billing they have tried to call him.

## 2013-09-30 ENCOUNTER — Ambulatory Visit: Payer: Medicare Other | Admitting: Oncology

## 2013-09-30 ENCOUNTER — Other Ambulatory Visit: Payer: Medicare Other

## 2013-09-30 ENCOUNTER — Telehealth: Payer: Self-pay | Admitting: *Deleted

## 2013-09-30 ENCOUNTER — Encounter: Payer: Self-pay | Admitting: Oncology

## 2013-09-30 NOTE — Telephone Encounter (Signed)
Called pt & he forgot his appt today due to son in hospital.  Will have scheduler r/s.

## 2013-09-30 NOTE — Progress Notes (Signed)
73 year old man on an every 3 month phlebotomy program for hemachromatosis. He failed to report for today's visit. He will need to be rescheduled.

## 2013-11-02 ENCOUNTER — Encounter: Payer: Self-pay | Admitting: Oncology

## 2013-12-17 ENCOUNTER — Other Ambulatory Visit: Payer: Self-pay | Admitting: Oncology

## 2013-12-30 ENCOUNTER — Other Ambulatory Visit: Payer: Medicare Other

## 2014-01-02 ENCOUNTER — Ambulatory Visit (HOSPITAL_BASED_OUTPATIENT_CLINIC_OR_DEPARTMENT_OTHER): Payer: Medicare Other

## 2014-01-02 LAB — LACTATE DEHYDROGENASE (CC13): LDH: 187 U/L (ref 125–245)

## 2014-01-03 ENCOUNTER — Other Ambulatory Visit: Payer: Self-pay | Admitting: Oncology

## 2014-01-03 LAB — FERRITIN CHCC: Ferritin: 43 ng/ml (ref 22–316)

## 2014-01-03 LAB — CBC WITH DIFFERENTIAL/PLATELET
BASO%: 0.8 % (ref 0.0–2.0)
Basophils Absolute: 0.1 10*3/uL (ref 0.0–0.1)
EOS ABS: 0.2 10*3/uL (ref 0.0–0.5)
EOS%: 3.2 % (ref 0.0–7.0)
HEMATOCRIT: 39.9 % (ref 38.4–49.9)
HGB: 14.3 g/dL (ref 13.0–17.1)
LYMPH%: 37.7 % (ref 14.0–49.0)
MCH: 33.1 pg (ref 27.2–33.4)
MCHC: 35.8 g/dL (ref 32.0–36.0)
MCV: 92.4 fL (ref 79.3–98.0)
MONO#: 0.6 10*3/uL (ref 0.1–0.9)
MONO%: 8.8 % (ref 0.0–14.0)
NEUT%: 49.5 % (ref 39.0–75.0)
NEUTROS ABS: 3.3 10*3/uL (ref 1.5–6.5)
PLATELETS: 269 10*3/uL (ref 140–400)
RBC: 4.32 10*6/uL (ref 4.20–5.82)
RDW: 13.2 % (ref 11.0–14.6)
WBC: 6.6 10*3/uL (ref 4.0–10.3)
lymph#: 2.5 10*3/uL (ref 0.9–3.3)
nRBC: 0 % (ref 0–0)

## 2014-01-06 ENCOUNTER — Other Ambulatory Visit: Payer: Self-pay | Admitting: Oncology

## 2014-01-08 ENCOUNTER — Other Ambulatory Visit: Payer: Self-pay | Admitting: Oncology

## 2014-01-19 ENCOUNTER — Other Ambulatory Visit: Payer: Self-pay | Admitting: Oncology

## 2014-02-06 ENCOUNTER — Ambulatory Visit (INDEPENDENT_AMBULATORY_CARE_PROVIDER_SITE_OTHER): Payer: Medicare Other | Admitting: Oncology

## 2014-02-06 ENCOUNTER — Encounter: Payer: Self-pay | Admitting: Oncology

## 2014-02-06 ENCOUNTER — Inpatient Hospital Stay (HOSPITAL_COMMUNITY): Admission: RE | Admit: 2014-02-06 | Payer: Medicare Other | Source: Ambulatory Visit

## 2014-02-06 DIAGNOSIS — M1712 Unilateral primary osteoarthritis, left knee: Secondary | ICD-10-CM

## 2014-02-06 DIAGNOSIS — M1990 Unspecified osteoarthritis, unspecified site: Secondary | ICD-10-CM

## 2014-02-06 DIAGNOSIS — D126 Benign neoplasm of colon, unspecified: Secondary | ICD-10-CM

## 2014-02-06 DIAGNOSIS — I10 Essential (primary) hypertension: Secondary | ICD-10-CM

## 2014-02-06 DIAGNOSIS — E785 Hyperlipidemia, unspecified: Secondary | ICD-10-CM

## 2014-02-06 HISTORY — DX: Hyperlipidemia, unspecified: E78.5

## 2014-02-06 HISTORY — DX: Unilateral primary osteoarthritis, left knee: M17.12

## 2014-02-06 HISTORY — DX: Essential (primary) hypertension: I10

## 2014-02-06 NOTE — Patient Instructions (Signed)
[facility] Check complete blood count and ferritin level every 3 months and have results faxed to [facility] [phone] Return visit in 1 year on February 09, 2015; lab day of visit unless you want to come 1 week ahead of time

## 2014-02-06 NOTE — Progress Notes (Signed)
Patient ID: Jeffery Carpenter, male   DOB: Feb 10, 1941, 73 y.o.   MRN: [id number] Hematology and Oncology Follow Up Visit  Jeffery Carpenter [id number] [birth year] 73 y.o. 02/06/2014 2:37 PM   Principle Diagnosis: Encounter Diagnosis  Name Primary?  . Hemochromatosis Yes     Interim History:  Follow up visit for this pleasant 73 year old male who is homozygous for the C282Y Hemachromatosis gene by his history. He was diagnosed back in 2001. Full records not available . He was diagnosed at the time that his brother was also found to have a hemachromatosis. He tells me that his initial ferritin was 883 units. He was initially on a weekly phlebotomy program and then over time on a every 3 month maintenance program. In general his ferritins run less than 100. Marland Kitchen  He has never had clinical hemachromatosis. His brother however , died of complications of hemachromatosis in June 2013 at age 107. Another brother died at age 83 of lung cancer.  His liver functions have been normal. He has had no interim medical problems. He did fall down this winter when he slipped on the ice in his backyard feeding the birds. He has been having some intermittent neck pain and intermittent paresthesias going down his left arm. He has not had any x-ray studies. He plans to see his orthopedic surgeon if the symptoms persist.  Finally blood banks are accepting donations from hemachromatosis patients. Up until the recent past, this blood was discarded. He tells me  that he used to donate blood about every 2 months in the past. This is probably what delayed the diagnosis of his hemachromatosis since he was actually being treated without knowing it. He went back to his local [facility]. They would be happy to accommodate him again. He would like to get his phlebotomies and lab work through them and I wrote him a letter today with orders to check CBC and ferritin every 3 months and phlebotomize as long as his hemoglobin is 12 g or above.  Most recent hemoglobin was 14.3 on 01/02/2014. Most recent ferritin was 43 on May 1.    Medications: reviewed  Allergies: No Known Allergies  Review of Systems: Negative except as recorded above. He is seeing his general physician about once a year and gets his health maintenance exams including prostate exam by him.  Physical Exam: Blood pressure 138/90, pulse 72, temperature 97 F (36.1 C), temperature source Oral, resp. rate 20, height 5' 6.5" (1.689 m), weight 189 lb 9.6 oz (86.002 kg), SpO2 98.00%. Wt Readings from Last 3 Encounters:  02/06/14 189 lb 9.6 oz (86.002 kg)  07/01/13 189 lb 9.6 oz (86.002 kg)  12/31/12 185 lb 3.2 oz (84.006 kg)     General appearance: Well-nourished Caucasian man HENNT: Pharynx no erythema, exudate, mass, or ulcer. No thyromegaly or thyroid nodules Lymph nodes: No cervical, supraclavicular, or axillary lymphadenopathy Breasts:  Lungs: Clear to auscultation, resonant to percussion throughout Heart: Regular rhythm, no murmur, no gallop, no rub, no click, no edema Abdomen: Soft, nontender, normal bowel sounds, no mass, no organomegaly Extremities: No edema, no calf tenderness Musculoskeletal: no joint deformities. Scar over left knee status post total knee replacement. GU:  Vascular: Carotid pulses 2+, no bruits,  Neurologic: Alert, oriented, PERRLA, , cranial nerves grossly normal, motor strength 5 over 5, reflexes absent but symmetric, upper body coordination normal, gait normal, Skin: No rash or ecchymosis  Lab Results: CBC W/Diff    Component Value Date/Time   WBC 6.6  01/02/2014 1500   RBC 4.32 01/02/2014 1500   HGB 14.3 01/02/2014 1500   HCT 39.9 01/02/2014 1500   PLT 269 01/02/2014 1500   MCV 92.4 01/02/2014 1500   MCH 33.1 01/02/2014 1500   MCHC 35.8 01/02/2014 1500   RDW 13.2 01/02/2014 1500   LYMPHSABS 2.5 01/02/2014 1500   MONOABS 0.6 01/02/2014 1500   EOSABS 0.2 01/02/2014 1500   BASOSABS 0.1 01/02/2014 1500     Chemistry       Component Value Date/Time   NA 144 08/22/2012 1340   NA 140 08/17/2011 0959   K 4.5 08/22/2012 1340   K 4.5 08/17/2011 0959   CL 104 08/22/2012 1340   CL 106 08/17/2011 0959   CO2 27 08/22/2012 1340   CO2 24 08/17/2011 0959   BUN 19.0 08/22/2012 1340   BUN 19 08/17/2011 0959   CREATININE 1.2 08/22/2012 1340   CREATININE 1.19 08/17/2011 0959      Component Value Date/Time   CALCIUM 9.6 08/22/2012 1340   CALCIUM 9.3 08/17/2011 0959   ALKPHOS 72 08/22/2012 1340   ALKPHOS 58 08/17/2011 0959   AST 23 08/22/2012 1340   AST 26 08/17/2011 0959   ALT 39 08/22/2012 1340   ALT 34 08/17/2011 0959   BILITOT 1.15 08/22/2012 1340   BILITOT 1.0 08/17/2011 0959        Impression:  #1. Hemachromatosis Continue intermittent phlebotomy He has been very stable so I will see him again for a visit in a year.  [facility] and [facility] results to me  #2. Hyperlipidemia  #3. Essential hypertension  #4. Degenerative arthritis  #5. Benign tubular adenomas of the colon   CC: Patient Care Team: Donnie Coffin, MD as PCP - General (Family Medicine)   Annia Belt, MD 6/4/20152:37 PM

## 2014-02-25 ENCOUNTER — Other Ambulatory Visit: Payer: Self-pay | Admitting: *Deleted

## 2014-02-25 MED ORDER — FOLIC ACID 1 MG PO TABS: ORAL_TABLET | ORAL | Status: DC

## 2014-07-11 ENCOUNTER — Other Ambulatory Visit: Payer: Self-pay | Admitting: Orthopedic Surgery

## 2014-09-08 ENCOUNTER — Encounter (HOSPITAL_BASED_OUTPATIENT_CLINIC_OR_DEPARTMENT_OTHER): Payer: Self-pay | Admitting: *Deleted

## 2014-09-12 ENCOUNTER — Ambulatory Visit (HOSPITAL_BASED_OUTPATIENT_CLINIC_OR_DEPARTMENT_OTHER): Payer: Medicare Other | Admitting: Anesthesiology

## 2014-09-12 ENCOUNTER — Ambulatory Visit (HOSPITAL_BASED_OUTPATIENT_CLINIC_OR_DEPARTMENT_OTHER)
Admission: RE | Admit: 2014-09-12 | Discharge: 2014-09-12 | Disposition: A | Discharge status: Home / Self Care | Payer: Medicare Other | Source: Ambulatory Visit | Attending: Orthopedic Surgery | Admitting: Orthopedic Surgery

## 2014-09-12 ENCOUNTER — Encounter (HOSPITAL_BASED_OUTPATIENT_CLINIC_OR_DEPARTMENT_OTHER): Payer: Self-pay | Admitting: Anesthesiology

## 2014-09-12 ENCOUNTER — Encounter (HOSPITAL_BASED_OUTPATIENT_CLINIC_OR_DEPARTMENT_OTHER)
Admission: RE | Disposition: A | Discharge status: Home / Self Care | Payer: Self-pay | Source: Ambulatory Visit | Attending: Orthopedic Surgery

## 2014-09-12 DIAGNOSIS — D1722 Benign lipomatous neoplasm of skin and subcutaneous tissue of left arm: Secondary | ICD-10-CM | POA: Insufficient documentation

## 2014-09-12 DIAGNOSIS — Z96652 Presence of left artificial knee joint: Secondary | ICD-10-CM | POA: Diagnosis not present

## 2014-09-12 DIAGNOSIS — R2232 Localized swelling, mass and lump, left upper limb: Secondary | ICD-10-CM | POA: Diagnosis not present

## 2014-09-12 DIAGNOSIS — M1712 Unilateral primary osteoarthritis, left knee: Secondary | ICD-10-CM | POA: Insufficient documentation

## 2014-09-12 DIAGNOSIS — Z87891 Personal history of nicotine dependence: Secondary | ICD-10-CM | POA: Diagnosis not present

## 2014-09-12 DIAGNOSIS — Z880 Allergy status to penicillin: Secondary | ICD-10-CM | POA: Insufficient documentation

## 2014-09-12 DIAGNOSIS — L821 Other seborrheic keratosis: Secondary | ICD-10-CM | POA: Insufficient documentation

## 2014-09-12 DIAGNOSIS — E785 Hyperlipidemia, unspecified: Secondary | ICD-10-CM | POA: Diagnosis not present

## 2014-09-12 DIAGNOSIS — I10 Essential (primary) hypertension: Secondary | ICD-10-CM | POA: Diagnosis not present

## 2014-09-12 DIAGNOSIS — D179 Benign lipomatous neoplasm, unspecified: Secondary | ICD-10-CM | POA: Insufficient documentation

## 2014-09-12 HISTORY — DX: Hemochromatosis, unspecified: E83.119

## 2014-09-12 HISTORY — PX: MASS EXCISION: SHX2000

## 2014-09-12 LAB — POCT HEMOGLOBIN-HEMACUE: Hemoglobin: 10.3 g/dL — ABNORMAL LOW (ref 13.0–17.0)

## 2014-09-12 SURGERY — EXCISION MASS
Anesthesia: General | Laterality: Left

## 2014-09-12 MED ORDER — LACTATED RINGERS IV SOLN
INTRAVENOUS | Status: DC
  Administered 2014-09-12: 08:00:00 via INTRAVENOUS

## 2014-09-12 MED ORDER — HYDROCODONE-ACETAMINOPHEN 5-325 MG PO TABS: 2.0000 | ORAL_TABLET | Freq: Four times a day (QID) | ORAL | Status: DC | PRN

## 2014-09-12 MED ORDER — FENTANYL CITRATE 0.05 MG/ML IJ SOLN
INTRAMUSCULAR | Status: DC | PRN
  Administered 2014-09-12 (×2): 50 ug via INTRAVENOUS

## 2014-09-12 MED ORDER — FENTANYL CITRATE 0.05 MG/ML IJ SOLN: 50.0000 ug | INTRAMUSCULAR | Status: DC | PRN

## 2014-09-12 MED ORDER — CHLORHEXIDINE GLUCONATE 4 % EX LIQD: 60.0000 mL | Freq: Once | CUTANEOUS | Status: DC

## 2014-09-12 MED ORDER — DEXAMETHASONE SODIUM PHOSPHATE 4 MG/ML IJ SOLN
INTRAMUSCULAR | Status: DC | PRN
  Administered 2014-09-12: 10 mg via INTRAVENOUS

## 2014-09-12 MED ORDER — OXYCODONE HCL 5 MG/5ML PO SOLN: 5.0000 mg | Freq: Once | ORAL | Status: DC | PRN

## 2014-09-12 MED ORDER — OXYCODONE HCL 5 MG PO TABS: 5.0000 mg | ORAL_TABLET | Freq: Once | ORAL | Status: DC | PRN

## 2014-09-12 MED ORDER — MIDAZOLAM HCL 2 MG/2ML IJ SOLN
INTRAMUSCULAR | Status: AC
  Filled 2014-09-12: qty 2

## 2014-09-12 MED ORDER — 0.9 % SODIUM CHLORIDE (POUR BTL) OPTIME
TOPICAL | Status: DC | PRN
  Administered 2014-09-12: 1000 mL

## 2014-09-12 MED ORDER — MIDAZOLAM HCL 2 MG/2ML IJ SOLN: 1.0000 mg | INTRAMUSCULAR | Status: DC | PRN

## 2014-09-12 MED ORDER — FENTANYL CITRATE 0.05 MG/ML IJ SOLN
INTRAMUSCULAR | Status: AC
  Filled 2014-09-12: qty 6

## 2014-09-12 MED ORDER — EPHEDRINE SULFATE 50 MG/ML IJ SOLN
INTRAMUSCULAR | Status: DC | PRN
  Administered 2014-09-12: 10 mg via INTRAVENOUS

## 2014-09-12 MED ORDER — CEFAZOLIN SODIUM-DEXTROSE 2-3 GM-% IV SOLR: 2.0000 g | INTRAVENOUS | Status: DC

## 2014-09-12 MED ORDER — FENTANYL CITRATE 0.05 MG/ML IJ SOLN: 25.0000 ug | INTRAMUSCULAR | Status: DC | PRN

## 2014-09-12 MED ORDER — BUPIVACAINE HCL (PF) 0.25 % IJ SOLN
INTRAMUSCULAR | Status: DC | PRN
  Administered 2014-09-12: 10 mL

## 2014-09-12 MED ORDER — PROPOFOL 10 MG/ML IV BOLUS
INTRAVENOUS | Status: DC | PRN
  Administered 2014-09-12: 30 mg via INTRAVENOUS
  Administered 2014-09-12: 200 mg via INTRAVENOUS

## 2014-09-12 MED ORDER — CEFAZOLIN SODIUM-DEXTROSE 2-3 GM-% IV SOLR
INTRAVENOUS | Status: AC
  Filled 2014-09-12: qty 50

## 2014-09-12 MED ORDER — PROMETHAZINE HCL 25 MG/ML IJ SOLN: 6.2500 mg | INTRAMUSCULAR | Status: DC | PRN

## 2014-09-12 MED ORDER — CEFAZOLIN SODIUM-DEXTROSE 2-3 GM-% IV SOLR
INTRAVENOUS | Status: DC | PRN
  Administered 2014-09-12: 2 g via INTRAVENOUS

## 2014-09-12 MED ORDER — LIDOCAINE HCL (CARDIAC) 20 MG/ML IV SOLN
INTRAVENOUS | Status: DC | PRN
  Administered 2014-09-12: 50 mg via INTRAVENOUS

## 2014-09-12 MED ORDER — SODIUM CHLORIDE 0.45 % IV SOLN: INTRAVENOUS | Status: DC

## 2014-09-12 SURGICAL SUPPLY — 55 items
BANDAGE COBAN STERILE 2 (GAUZE/BANDAGES/DRESSINGS) IMPLANT
BANDAGE ELASTIC 3 VELCRO ST LF (GAUZE/BANDAGES/DRESSINGS) ×2 IMPLANT
BLADE SURG 15 STRL LF DISP TIS (BLADE) ×2 IMPLANT
BLADE SURG 15 STRL SS (BLADE) ×6
BNDG COHESIVE 1X5 TAN STRL LF (GAUZE/BANDAGES/DRESSINGS) IMPLANT
BNDG COHESIVE 3X5 TAN STRL LF (GAUZE/BANDAGES/DRESSINGS) IMPLANT
BNDG CONFORM 3 STRL LF (GAUZE/BANDAGES/DRESSINGS) ×3 IMPLANT
BNDG GAUZE ELAST 4 BULKY (GAUZE/BANDAGES/DRESSINGS) IMPLANT
BRUSH SCRUB EZ PLAIN DRY (MISCELLANEOUS) ×3 IMPLANT
CLOSURE WOUND 1/2 X4 (GAUZE/BANDAGES/DRESSINGS)
CORDS BIPOLAR (ELECTRODE) ×3 IMPLANT
COVER BACK TABLE 60X90IN (DRAPES) ×3 IMPLANT
COVER MAYO STAND STRL (DRAPES) ×3 IMPLANT
CUFF TOURNIQUET SINGLE 18IN (TOURNIQUET CUFF) ×2 IMPLANT
DECANTER SPIKE VIAL GLASS SM (MISCELLANEOUS) IMPLANT
DRAPE EXTREMITY T 121X128X90 (DRAPE) ×3 IMPLANT
DRAPE SURG 17X23 STRL (DRAPES) ×3 IMPLANT
DRSG EMULSION OIL 3X3 NADH (GAUZE/BANDAGES/DRESSINGS) ×3 IMPLANT
GAUZE SPONGE 4X4 12PLY STRL (GAUZE/BANDAGES/DRESSINGS) ×2 IMPLANT
GLOVE BIOGEL M STRL SZ7.5 (GLOVE) ×3 IMPLANT
GLOVE SS BIOGEL STRL SZ 8 (GLOVE) ×1 IMPLANT
GLOVE SUPERSENSE BIOGEL SZ 8 (GLOVE) ×2
GOWN STRL REUS W/ TWL LRG LVL3 (GOWN DISPOSABLE) ×1 IMPLANT
GOWN STRL REUS W/ TWL XL LVL3 (GOWN DISPOSABLE) ×1 IMPLANT
GOWN STRL REUS W/TWL LRG LVL3 (GOWN DISPOSABLE) ×3
GOWN STRL REUS W/TWL XL LVL3 (GOWN DISPOSABLE) ×3
LOOP VESSEL MAXI BLUE (MISCELLANEOUS) IMPLANT
NDL HYPO 25X1 1.5 SAFETY (NEEDLE) ×1 IMPLANT
NEEDLE HYPO 22GX1.5 SAFETY (NEEDLE) IMPLANT
NEEDLE HYPO 25X1 1.5 SAFETY (NEEDLE) ×3 IMPLANT
NS IRRIG 1000ML POUR BTL (IV SOLUTION) ×3 IMPLANT
PACK BASIN DAY SURGERY FS (CUSTOM PROCEDURE TRAY) ×3 IMPLANT
PAD ALCOHOL SWAB (MISCELLANEOUS) IMPLANT
PAD CAST 3X4 CTTN HI CHSV (CAST SUPPLIES) IMPLANT
PADDING CAST ABS 3INX4YD NS (CAST SUPPLIES)
PADDING CAST ABS 4INX4YD NS (CAST SUPPLIES)
PADDING CAST ABS COTTON 3X4 (CAST SUPPLIES) IMPLANT
PADDING CAST ABS COTTON 4X4 ST (CAST SUPPLIES) IMPLANT
PADDING CAST COTTON 3X4 STRL (CAST SUPPLIES) ×3
SPLINT FIBERGLASS 3X35 (CAST SUPPLIES) IMPLANT
SPLINT PLASTER CAST XFAST 3X15 (CAST SUPPLIES) IMPLANT
SPLINT PLASTER CAST XFAST 4X15 (CAST SUPPLIES) IMPLANT
SPLINT PLASTER XTRA FAST SET 4 (CAST SUPPLIES)
SPLINT PLASTER XTRA FASTSET 3X (CAST SUPPLIES)
STOCKINETTE 4X48 STRL (DRAPES) ×3 IMPLANT
STOCKINETTE SYNTHETIC 3 UNSTER (CAST SUPPLIES) IMPLANT
STOCKINETTE SYNTHETIC 4 NONSTR (MISCELLANEOUS) IMPLANT
STRIP CLOSURE SKIN 1/2X4 (GAUZE/BANDAGES/DRESSINGS) IMPLANT
SUT PROLENE 4 0 PS 2 18 (SUTURE) ×5 IMPLANT
SUT PROLENE 5 0 P 3 (SUTURE) IMPLANT
SYR BULB 3OZ (MISCELLANEOUS) ×3 IMPLANT
SYR CONTROL 10ML LL (SYRINGE) ×1 IMPLANT
TOWEL OR 17X24 6PK STRL BLUE (TOWEL DISPOSABLE) ×3 IMPLANT
TOWEL OR NON WOVEN STRL DISP B (DISPOSABLE) ×3 IMPLANT
UNDERPAD 30X30 INCONTINENT (UNDERPADS AND DIAPERS) ×3 IMPLANT

## 2014-09-12 NOTE — Anesthesia Procedure Notes (Signed)
Procedure Name: LMA Insertion Date/Time: 09/12/2014 9:15 AM Performed by: Toula Moos L Pre-anesthesia Checklist: Patient identified, Emergency Drugs available, Suction available, Patient being monitored and Timeout performed Patient Re-evaluated:Patient Re-evaluated prior to inductionOxygen Delivery Method: Circle System Utilized Preoxygenation: Pre-oxygenation with 100% oxygen Intubation Type: IV induction Ventilation: Mask ventilation without difficulty LMA: LMA inserted LMA Size: 5.0 Number of attempts: 1 Airway Equipment and Method: bite block Placement Confirmation: positive ETCO2 Tube secured with: Tape Dental Injury: Teeth and Oropharynx as per pre-operative assessment

## 2014-09-12 NOTE — H&P (Signed)
Jeffery Carpenter is an 74 y.o. male.   Chief Complaint: patient presents for mass removal numerous in nature about left upper extremity HPI: patient presents for mass removal left arm and antecubital fossa region  He denies other complaints at this time.  Past Medical History  Diagnosis Date  . 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 Other and unspecified hyperlipidemia 02/06/2014  . HTN (hypertension), benign 02/06/2014    pt takes inderal for pre competition calmness  . Hemochromatosis     Past Surgical History  Procedure Laterality Date  . Appendectomy    . Hernia repair      abdomen  . Eye surgery      cataract  . Joint replacement      lt TKA  . Tonsillectomy    . Knee arthroscopy Right   . Bilateral carpal tunnel release Bilateral   . Wrist surgery Left     History reviewed. No pertinent family history. Social History:  reports that he quit smoking about 54 years ago. He does not have any smokeless tobacco history on file. He reports that he drinks alcohol. He reports that he does not use illicit drugs.  Allergies:  Allergies  Allergen Reactions  . Penicillins Other (See Comments)    Doesn't work    Medications Prior to Admission  Medication Sig Dispense Refill  . folic acid (FOLVITE) 1 MG tablet TAKE ONE TABLET BY MOUTH ONCE DAILY 100 tablet 3  . pravastatin (PRAVACHOL) 40 MG tablet Take 40 mg by mouth daily.      . propranolol (INDERAL) 40 MG tablet Take 40 mg by mouth as needed.    Francella Solian Johns Wort 150 MG CAPS Take 2 capsules by mouth daily.    Marland Kitchen thiamine (VITAMIN B-1) 50 MG tablet Take 50 mg by mouth daily.        Results for orders placed or performed during the hospital encounter of 09/12/14 (from the past 48 hour(s))  Hemoglobin-hemacue, POC     Status: Abnormal   Collection Time: 09/12/14  7:48 AM  Result Value Ref Range   Hemoglobin 10.3 (L) 13.0 - 17.0 g/dL   No results found.  Review of Systems  Respiratory: Negative.   Gastrointestinal:  Negative.   Genitourinary: Negative.   Neurological: Negative.   Psychiatric/Behavioral: Negative.     Blood pressure 155/99, pulse 77, temperature 97.8 F (36.6 C), temperature source Oral, resp. rate 18, height 5\' 7"  (1.702 m), weight 84.823 kg (187 lb), SpO2 95 %. Physical Exam patient has 2 masses about the antecubital region and forearm. 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  He also has a skin lesion which we're planning removal of as  Well.  He denies numbness or tingling. He denies other pain complaints  The patient is alert and oriented in no acute distress the patient complains of pain in the affected upper extremity.  The patient is noted to have a normal HEENT exam.  Lung fields show equal chest expansion and no shortness of breath  abdomen exam is nontender without distention.  Lower extremity examination does not show any fracture dislocation or blood clot symptoms.  Pelvis is stable neck and back are stable and nontender Assessment/Plan Patient is here for surgical excision of multiple masses left upper extremity. He understands risks and benefits and we will proceed  We are planning surgery for your upper extremity. The risk and benefits of surgery include risk  of bleeding infection anesthesia damage to normal structures and failure of the surgery to accomplish its intended goals of relieving symptoms and restoring function with this in mind we'll going to proceed. I have specifically discussed with the patient the pre-and postoperative regime and the does and don'ts and risk and benefits in great detail. Risk and benefits of surgery also include risk of dystrophy chronic nerve pain failure of the healing process to go onto completion and other inherent risks of surgery The relavent the pathophysiology of the disease/injury process, as well as the alternatives for treatment and postoperative course of action has been discussed in great detail  with the patient who desires to proceed.  We will do everything in our power to help you (the patient) restore function to the upper extremity. Is a pleasure to see this patient today.   JAECOB, LOWDEN 09/12/2014, 8:54 AM

## 2014-09-12 NOTE — Transfer of Care (Signed)
Immediate Anesthesia Transfer of Care Note  Patient: Jeffery Carpenter  Procedure(s) Performed: Procedure(s): LEFT ELBOW AND FOREARM MASS REMOVAL (Left)  Patient Location: PACU  Anesthesia Type:General  Level of Consciousness: awake, alert  and patient cooperative  Airway & Oxygen Therapy: Patient Spontanous Breathing and Patient connected to face mask oxygen  Post-op Assessment: Report given to PACU RN and Post -op Vital signs reviewed and stable  Post vital signs: Reviewed and stable  Complications: No apparent anesthesia complications

## 2014-09-12 NOTE — Op Note (Signed)
See Dictation#[id number] Amedeo Plenty MD

## 2014-09-12 NOTE — Discharge Instructions (Signed)
Keep bandage clean and dry.  Call for any problems.  No smoking.  Criteria for driving a car: you should be off your pain medicine for 7-8 hours, able to drive one handed(confident), thinking clearly and feeling able in your judgement to drive. Continue elevation as it will decrease swelling.  If instructed by MD move your fingers within the confines of the bandage/splint.  Use ice if instructed by your MD. Call immediately for any sudden loss of feeling in your hand/arm or change in functional abilities of the extremity.   We recommend that you to take vitamin C 1000 mg a day to promote healing we also recommend that if you require her pain medicine that he take a stool softener to prevent constipation as most pain medicines will have constipation side effects. We recommend either Peri-Colace or Senokot and recommend that you also consider adding MiraLAX to prevent the constipation affects from pain medicine if you are required to use them. These medicines are over the counter and maybe purchased at a local pharmacy.   Post Anesthesia Home Care Instructions  Activity: Get plenty of rest for the remainder of the day. A responsible adult should stay with you for 24 hours following the procedure.  For the next 24 hours, DO NOT: -Drive a car -Paediatric nurse -Drink alcoholic beverages -Take any medication unless instructed by your physician -Make any legal decisions or sign important papers.  Meals: Start with liquid foods such as gelatin or soup. Progress to regular foods as tolerated. Avoid greasy, spicy, heavy foods. If nausea and/or vomiting occur, drink only clear liquids until the nausea and/or vomiting subsides. Call your physician if vomiting continues.  Special Instructions/Symptoms: Your throat may feel dry or sore from the anesthesia or the breathing tube placed in your throat during surgery. If this causes discomfort, gargle with warm salt water. The discomfort should disappear  within 24 hours.

## 2014-09-12 NOTE — Anesthesia Preprocedure Evaluation (Addendum)
Anesthesia Evaluation  Patient identified by MRN, date of birth, ID band Patient awake    Reviewed: Allergy & Precautions, NPO status , Patient's Chart, lab work & pertinent test results  Airway Mallampati: III  TM Distance: >3 FB Neck ROM: Full    Dental  (+) Teeth Intact, Dental Advisory Given   Pulmonary former smoker,          Cardiovascular hypertension, Pt. on home beta blockers     Neuro/Psych negative neurological ROS  negative psych ROS   GI/Hepatic negative GI ROS, Neg liver ROS,   Endo/Other  negative endocrine ROS  Renal/GU negative Renal ROS     Musculoskeletal  (+) Arthritis -,   Abdominal   Peds  Hematology negative hematology ROS (+)   Anesthesia Other Findings   Reproductive/Obstetrics                            Anesthesia Physical Anesthesia Plan  ASA: II  Anesthesia Plan: General   Post-op Pain Management:    Induction: Intravenous  Airway Management Planned: LMA  Additional Equipment:   Intra-op Plan:   Post-operative Plan: Extubation in OR  Informed Consent: I have reviewed the patients History and Physical, chart, labs and discussed the procedure including the risks, benefits and alternatives for the proposed anesthesia with the patient or authorized representative who has indicated his/her understanding and acceptance.     Plan Discussed with: CRNA  Anesthesia Plan Comments:         Anesthesia Quick Evaluation

## 2014-09-12 NOTE — Anesthesia Postprocedure Evaluation (Signed)
  Anesthesia Post-op Note  Patient: Jeffery Carpenter  Procedure(s) Performed: Procedure(s): LEFT ELBOW AND FOREARM MASS REMOVAL (Left)  Patient Location: PACU  Anesthesia Type:General  Level of Consciousness: awake and alert   Airway and Oxygen Therapy: Patient Spontanous Breathing  Post-op Pain: none  Post-op Assessment: Post-op Vital signs reviewed, Patient's Cardiovascular Status Stable and Respiratory Function Stable  Post-op Vital Signs: Reviewed  Filed Vitals:   09/12/14 1054  BP: 142/86  Pulse: 75  Temp: 36.4 C  Resp: 18    Complications: No apparent anesthesia complications

## 2014-09-15 ENCOUNTER — Encounter (HOSPITAL_BASED_OUTPATIENT_CLINIC_OR_DEPARTMENT_OTHER): Payer: Self-pay | Admitting: Orthopedic Surgery

## 2014-09-15 NOTE — Op Note (Signed)
NAME:  Jeffery Carpenter, Jeffery Carpenter NO.:  [id number]  MEDICAL RECORD NO.:  [id number]  LOCATION:                                 FACILITY:  PHYSICIAN:  Satira Anis. Langston Summerfield, M.D.DATE OF BIRTH:  March 12, 1941  DATE OF PROCEDURE:  09/12/2014 DATE OF DISCHARGE:  09/12/2014                              OPERATIVE REPORT   PREOPERATIVE DIAGNOSES: 1. Greater than 1.5 cm mass, left forearm. 2. Greater than 1.5 cm mass, left antecubital fossa. 3. Left lateral antecubital fossa skin lesion/mass.  POSTOPERATIVE DIAGNOSES: 1. Greater than 1.5 cm mass, left forearm. 2. Greater than 1.5 cm mass, left antecubital fossa. 3. Left lateral antecubital fossa skin lesion/mass.  PROCEDURE: 1. Excision, left forearm greater than 1.5 cm mass superficial to the     fascia. 2. Removal of greater than 1.5 cm mass, left antecubital fossa. 3. Removal of less than 1 cm mass, left lateral antecubital fossa.  SURGEON:  Satira Anis. Amedeo Plenty, M.D.  ASSISTANT:  None.  COMPLICATIONS:  None.  ANESTHESIA:  General.  TOURNIQUET TIME:  Less than 30 minutes.  INDICATIONS FOR PROCEDURE:  This patient is a pleasant 74 year old male who presents with the above-mentioned diagnoses.  I have counseled him in regards to the risks and benefits of surgery.  He desires to proceed. All questions have been encouraged and answered preoperatively.  OPERATIVE PROCEDURE:  The patient was seen by myself and Anesthesia. Taken to the operative theater, underwent a smooth induction of general anesthetic, laid supine, appropriately padded, prepped and draped in usual sterile fashion with Betadine scrub and paint.  Following this, the patient had final time-out called.  Tourniquet was insufflated and preoperative antibiotics were secured.  Once this was complete, the patient then underwent a very careful and cautious approach to the extremity with insufflation of the tourniquet.  Following this, I performed a 1 inch incision  over the left volar radial forearm. Dissection was carried down and a graded 1.5 cm mass was removed superficial to the fascia.  This was removed and sent for specimen.  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  Later in the case, this area was irrigated copiously and closed with Prolene with a horizontal mattress stitch.  Following this, we then performed very careful and cautious approach to the antecubital fossa.  A similar 1-inch incision was made.  Dissection was carried down in the antecubital fossa region and following this a 1.5 cm mass was removed.  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 Satellite lesion was removed without difficulty.  Following this, I irrigated copiously and then later in the case, closed the wound with horizontal mattress 4-0 Prolene after hemostasis was secured.  Following this, we then turned attention towards the 3rd mass, which was a skin lesion with somewhat irregular borders.  I made sure that we performed a careful and cautious excision with 3 mm rim of tissue that appeared to be normal visually in our margin.  This is to say 3 mm of normal skin tissue were excised on all sides of this area.  I dissected down and took this immediately off the  deep fascia/deep tissue about the subcu.  The patient tolerated this well.  I did not penetrate the deep fascia and the 3rd lesion of course was the skin lesion in question. This was sent for specimen as were the 2 prior lipomatous specimens.  Area was irrigated and closed.  Meticulous hemostasis was ensured at all times. The patient was closed nicely.  Adaptic, Xeroform, gauze, and Kling, Kerlix, and an Ace wrap were placed.  He will see [place] back in the office in 10-14 days for suture removal.  He tolerated the procedure well and there were no complicating features.  These notes have been discussed and all questions have  been encouraged and answered.     Satira Anis. Amedeo Plenty, M.D.     [facility]  D:  09/12/2014  T:  09/13/2014  Job:  [id number]

## 2015-02-09 ENCOUNTER — Encounter: Payer: Medicare Other | Admitting: Oncology

## 2015-02-16 ENCOUNTER — Ambulatory Visit (INDEPENDENT_AMBULATORY_CARE_PROVIDER_SITE_OTHER): Payer: Medicare Other | Admitting: Oncology

## 2015-02-16 ENCOUNTER — Encounter: Payer: Self-pay | Admitting: Oncology

## 2015-02-16 DIAGNOSIS — I10 Essential (primary) hypertension: Secondary | ICD-10-CM | POA: Diagnosis not present

## 2015-02-16 DIAGNOSIS — E785 Hyperlipidemia, unspecified: Secondary | ICD-10-CM

## 2015-02-16 DIAGNOSIS — D126 Benign neoplasm of colon, unspecified: Secondary | ICD-10-CM

## 2015-02-16 DIAGNOSIS — M1990 Unspecified osteoarthritis, unspecified site: Secondary | ICD-10-CM | POA: Diagnosis not present

## 2015-02-16 NOTE — Patient Instructions (Signed)
To lab today Return 1 year:  lab 1 week before visit

## 2015-02-16 NOTE — Progress Notes (Signed)
Patient ID: Jeffery Carpenter, male   DOB: 1940-10-10, 74 y.o.   MRN: [id number] Hematology and Oncology Follow Up Visit  SYLVANUS TELFORD [id number] Apr 13, 1941 74 y.o. 02/16/2015 6:14 PM   Principle Diagnosis: Encounter Diagnosis  Name Primary?  . 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 He was diagnosed back in 2001. Full records not available . He was diagnosed at the time that his brother was also found to have a hemachromatosis. He tells me that his initial ferritin was 883 units. He was initially on a weekly phlebotomy program and then, over time, on a every 3 month maintenance program. In general his ferritins run less than 100. Marland Kitchen  He has never had clinical hemachromatosis. His brother however , died of complications of hemachromatosis in June 2013 at age 61. Another brother died at age 80 of lung cancer.  His liver functions have been normal.  Interim History:  Subsequent to his visit with me last year, he went back to the [facility] and they agreed to do periodic phlebotomies there. He has been donating blood every 2 months. I had asked that they send me lab values but they told him they were not able to do this. He has had no interim medical problems. He did get bit by a tick on his left thigh a few weeks ago. He was able to remove the tick. He has been putting cortisone cream over the area and the inflammation and swelling has gone down significantly.  Medications: reviewed  Allergies:  Allergies  Allergen Reactions  . Penicillins Other (See Comments)    Doesn't work    Review of Systems: See history of present illness  Remaining ROS negative:   Physical Exam: Blood pressure 130/90, pulse 90, temperature 97.8 F (36.6 C), temperature source Oral, height 5\' 8"  (1.727 m), weight 190 lb 8 oz (86.41 kg), SpO2 96 %. Wt Readings from Last 3 Encounters:  02/16/15 190 lb 8 oz (86.41  kg)  09/12/14 187 lb (84.823 kg)  02/06/14 189 lb 9.6 oz (86.002 kg)     General appearance: Well-nourished Caucasian man HENNT: Pharynx no erythema, exudate, mass, or ulcer. No thyromegaly or thyroid nodules Lymph nodes: No cervical, supraclavicular, or axillary lymphadenopathy Breasts:  Lungs: Clear to auscultation, resonant to percussion throughout Heart: Regular rhythm, no murmur, no gallop, no rub, no click, no edema Abdomen: Soft, nontender, normal bowel sounds, no mass, no organomegaly Extremities: No edema, no calf tenderness Musculoskeletal: no joint deformities GU:  Vascular: Carotid pulses 2+, no bruits, Neurologic: Alert, oriented, PERRLA,  cranial nerves grossly normal, motor strength 5 over 5, reflexes 1+ symmetric, upper body coordination normal, gait normal, Skin: Erythematous raised papular area lateral left thigh from recent tick bite. Does not appear infected. No classic bull's-eye appearance.  Lab Results: 6/13: Hb 15, ferritin 51 CBC W/Diff    Component Value Date/Time   WBC 6.6 01/02/2014 1500   RBC 4.32 01/02/2014 1500   HGB 10.3* 09/12/2014 0748   HGB 14.3 01/02/2014 1500   HCT 39.9 01/02/2014 1500   PLT 269 01/02/2014 1500   MCV 92.4 01/02/2014 1500   MCH 33.1 01/02/2014 1500   MCHC 35.8 01/02/2014 1500   RDW 13.2 01/02/2014 1500   LYMPHSABS 2.5 01/02/2014 1500   MONOABS 0.6 01/02/2014 1500   EOSABS 0.2 01/02/2014 1500   BASOSABS 0.1 01/02/2014 1500     Chemistry  Component Value Date/Time   NA 144 08/22/2012 1340   NA 140 08/17/2011 0959   K 4.5 08/22/2012 1340   K 4.5 08/17/2011 0959   CL 104 08/22/2012 1340   CL 106 08/17/2011 0959   CO2 27 08/22/2012 1340   CO2 24 08/17/2011 0959   BUN 19.0 08/22/2012 1340   BUN 19 08/17/2011 0959   CREATININE 1.2 08/22/2012 1340   CREATININE 1.19 08/17/2011 0959      Component Value Date/Time   CALCIUM 9.6 08/22/2012 1340   CALCIUM 9.3 08/17/2011 0959   ALKPHOS 72 08/22/2012 1340   ALKPHOS 58  08/17/2011 0959   AST 23 08/22/2012 1340   AST 26 08/17/2011 0959   ALT 39 08/22/2012 1340   ALT 34 08/17/2011 0959   BILITOT 1.15 08/22/2012 1340   BILITOT 1.0 08/17/2011 0959       Radiological Studies: No results found.  Impression:  #1. Hemachromatosis Continue [facility].  #2. Hyperlipidemia  #3. Essential hypertension  #4. Degenerative arthritis  #5. Benign tubular adenomas of the colon  CC: Patient Care Team: L.Donnie Coffin, MD as PCP - General (Family Medicine)   Annia Belt, MD 6/13/20166:14 PM

## 2015-02-17 LAB — CBC WITH DIFFERENTIAL/PLATELET
Basophils Absolute: 0.1 10*3/uL (ref 0.0–0.1)
Basophils Relative: 1 % (ref 0–1)
EOS PCT: 3 % (ref 0–5)
Eosinophils Absolute: 0.2 10*3/uL (ref 0.0–0.7)
HCT: 44.4 % (ref 39.0–52.0)
HEMOGLOBIN: 15 g/dL (ref 13.0–17.0)
LYMPHS ABS: 2 10*3/uL (ref 0.7–4.0)
Lymphocytes Relative: 25 % (ref 12–46)
MCH: 32.2 pg (ref 26.0–34.0)
MCHC: 33.8 g/dL (ref 30.0–36.0)
MCV: 95.3 fL (ref 78.0–100.0)
MPV: 9.9 fL (ref 8.6–12.4)
Monocytes Absolute: 0.7 10*3/uL (ref 0.1–1.0)
Monocytes Relative: 9 % (ref 3–12)
Neutro Abs: 4.8 10*3/uL (ref 1.7–7.7)
Neutrophils Relative %: 62 % (ref 43–77)
Platelets: 286 10*3/uL (ref 150–400)
RBC: 4.66 MIL/uL (ref 4.22–5.81)
RDW: 13.2 % (ref 11.5–15.5)
WBC: 7.8 10*3/uL (ref 4.0–10.5)

## 2015-02-17 LAB — COMPREHENSIVE METABOLIC PANEL
ALBUMIN: 4 g/dL (ref 3.5–5.2)
ALT: 22 U/L (ref 0–53)
AST: 21 U/L (ref 0–37)
Alkaline Phosphatase: 59 U/L (ref 39–117)
BUN: 21 mg/dL (ref 6–23)
CALCIUM: 9.3 mg/dL (ref 8.4–10.5)
CHLORIDE: 106 meq/L (ref 96–112)
CO2: 27 mEq/L (ref 19–32)
Creat: 1.3 mg/dL (ref 0.50–1.35)
Glucose, Bld: 108 mg/dL — ABNORMAL HIGH (ref 70–99)
POTASSIUM: 4.6 meq/L (ref 3.5–5.3)
Sodium: 140 mEq/L (ref 135–145)
Total Bilirubin: 0.7 mg/dL (ref 0.2–1.2)
Total Protein: 7 g/dL (ref 6.0–8.3)

## 2015-02-17 LAB — FERRITIN: FERRITIN: 51 ng/mL (ref 22–322)

## 2015-02-18 ENCOUNTER — Telehealth: Payer: Self-pay | Admitting: *Deleted

## 2015-02-18 NOTE — Telephone Encounter (Signed)
Pt called / informed "iron storage protein is good and red count normal so ok to donate blood every 2 -3 months" per Dr Beryle Beams. Ferritin 51 , Hgb @ 15.0 per pt's request.

## 2015-02-18 NOTE — Telephone Encounter (Signed)
-----   Message from Annia Belt, MD sent at 02/17/2015  7:23 AM EDT ----- Call pt: iron storage protein is good and red count normal so OK to donate blood every 2-3 months

## 2015-04-13 ENCOUNTER — Other Ambulatory Visit: Payer: Self-pay | Admitting: Oncology

## 2015-04-17 ENCOUNTER — Other Ambulatory Visit: Payer: Self-pay | Admitting: Oncology

## 2015-08-03 ENCOUNTER — Other Ambulatory Visit: Payer: Self-pay | Admitting: Oncology

## 2015-11-18 ENCOUNTER — Other Ambulatory Visit: Payer: Self-pay | Admitting: Oncology

## 2015-11-19 ENCOUNTER — Other Ambulatory Visit: Payer: Self-pay | Admitting: Oncology

## 2015-11-19 MED ORDER — FOLIC ACID 1 MG PO TABS: 1.0000 mg | ORAL_TABLET | Freq: Every day | ORAL | Status: DC

## 2015-11-20 NOTE — Telephone Encounter (Signed)
Rx already sent to his pharmacy 3/16

## 2016-02-19 ENCOUNTER — Other Ambulatory Visit: Payer: Medicare Other

## 2016-02-29 ENCOUNTER — Encounter: Payer: Self-pay | Admitting: Oncology

## 2016-02-29 ENCOUNTER — Ambulatory Visit (INDEPENDENT_AMBULATORY_CARE_PROVIDER_SITE_OTHER): Payer: Medicare Other | Admitting: Oncology

## 2016-02-29 VITALS — BP 129/94 | HR 78 | Temp 98.0°F | Ht 68.0 in | Wt 190.1 lb

## 2016-02-29 DIAGNOSIS — N529 Male erectile dysfunction, unspecified: Secondary | ICD-10-CM | POA: Diagnosis not present

## 2016-02-29 NOTE — Patient Instructions (Signed)
To lab today Return visit in 1 year Lab 1 week before visit

## 2016-02-29 NOTE — Progress Notes (Signed)
Patient ID: Jeffery Carpenter, male   DOB: 30-Dec-1940, 75 y.o.   MRN: [id number] Hematology and Oncology Follow Up Visit  BARTOLOMEO SACHAR [id number] 1941-02-24 75 y.o. 02/29/2016 11:00 AM   Principle Diagnosis: Encounter Diagnoses  Name Primary?  . Hemochromatosis   . 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 He was diagnosed back in 2001. Full records not available . He was diagnosed at the time that his brother was also found to have a hemachromatosis. He tells me that his initial ferritin was 883 units. He was initially on a weekly phlebotomy program and then, over time, on a every 3 month maintenance program. In general his ferritins run less than 100. Marland Kitchen  He has never had clinical hemachromatosis. His brother however , died of complications of hemachromatosis in June 2013 at age 52. Another brother died at age 44 of lung cancer.  His liver functions have been normal. Subsequent to his first visit with me in 2014, I suggested he return to the [facility] to see if they would be willing to do phlebotomy for him. They were and he has been getting phlebotomies every 2 months. Last procedure done 02/02/2016. Ferritin recorded at time of his visit here on 02/16/2015 was 51. He has a son aged 70 (2017), and a daughter aged 39(2017). He states that both of them have been checked but he doesn't know their status. Neither one has been put on a phlebotomy program.  Interim History:  He has had no interim medical problems. He admits to chronic paresthesias of his feet. He reminded me that he had bilateral carpal tunnel surgery. He also is trouble with erectile dysfunction. He tried Viagra but it causes a severe headache and he did not want to go back on this drug or any of the similar ones. He is not diabetic. He is normotensive on single agent propranolol.  Medications:  reviewed  Allergies:  Allergies  Allergen Reactions  . Penicillins Other (See Comments)    Doesn't work    Review of Systems: See interim history Remaining ROS negative:   Physical Exam: Blood pressure 129/94, pulse 78, temperature 98 F (36.7 C), temperature source Oral, height 5\' 8"  (1.727 m), weight 190 lb 1.6 oz (86.229 kg), SpO2 96 %. Wt Readings from Last 3 Encounters:  02/29/16 190 lb 1.6 oz (86.229 kg)  02/16/15 190 lb 8 oz (86.41 kg)  09/12/14 187 lb (84.823 kg)     General appearance: Well-nourished Caucasian man HENNT: Pharynx no erythema, exudate, mass, or ulcer. No thyromegaly or thyroid nodules Lymph nodes: No cervical, supraclavicular, or axillary lymphadenopathy Breasts: Lungs: Clear to auscultation, resonant to percussion throughout Heart: Regular rhythm, no murmur, no gallop, no rub, no click, no edema Abdomen: Soft, nontender, normal bowel sounds, no mass, no organomegaly Extremities: No edema, no calf tenderness Musculoskeletal: no joint deformities GU:  Vascular: Carotid pulses 2+, no bruits,  Neurologic: Alert, oriented, PERRLA, , cranial nerves grossly normal, motor strength 5 over 5, reflexes 1+ symmetric, upper body coordination normal, gait normal, Skin: No rash or ecchymosis  Lab Results: CBC W/Diff    Component Value Date/Time   WBC 7.8 02/16/2015 1530   WBC 6.6 01/02/2014 1500   RBC 4.66 02/16/2015 1530   RBC 4.32 01/02/2014 1500   HGB 15.0 02/16/2015 1530   HGB 14.3 01/02/2014 1500   HCT 44.4 02/16/2015 1530   HCT 39.9  01/02/2014 1500   PLT 286 02/16/2015 1530   PLT 269 01/02/2014 1500   MCV 95.3 02/16/2015 1530   MCV 92.4 01/02/2014 1500   MCH 32.2 02/16/2015 1530   MCH 33.1 01/02/2014 1500   MCHC 33.8 02/16/2015 1530   MCHC 35.8 01/02/2014 1500   RDW 13.2 02/16/2015 1530   RDW 13.2 01/02/2014 1500   LYMPHSABS 2.0 02/16/2015 1530   LYMPHSABS 2.5 01/02/2014 1500   MONOABS 0.7 02/16/2015 1530   MONOABS 0.6 01/02/2014 1500    EOSABS 0.2 02/16/2015 1530   EOSABS 0.2 01/02/2014 1500   BASOSABS 0.1 02/16/2015 1530   BASOSABS 0.1 01/02/2014 1500     Chemistry      Component Value Date/Time   NA 140 02/16/2015 1530   NA 144 08/22/2012 1340   K 4.6 02/16/2015 1530   K 4.5 08/22/2012 1340   CL 106 02/16/2015 1530   CL 104 08/22/2012 1340   CO2 27 02/16/2015 1530   CO2 27 08/22/2012 1340   BUN 21 02/16/2015 1530   BUN 19.0 08/22/2012 1340   CREATININE 1.30 02/16/2015 1530   CREATININE 1.2 08/22/2012 1340   CREATININE 1.19 08/17/2011 0959      Component Value Date/Time   CALCIUM 9.3 02/16/2015 1530   CALCIUM 9.6 08/22/2012 1340   ALKPHOS 59 02/16/2015 1530   ALKPHOS 72 08/22/2012 1340   AST 21 02/16/2015 1530   AST 23 08/22/2012 1340   ALT 22 02/16/2015 1530   ALT 39 08/22/2012 1340   BILITOT 0.7 02/16/2015 1530   BILITOT 1.15 08/22/2012 1340       Radiological Studies: No results found.  Impression:  #1. 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 No clinical or laboratory evidence of hemochromatosis. His ferritin levels remain in optimal range on every 2 month phlebotomy. Today's labs are pending but I don't anticipate any change in management.  #2. Erectile dysfunction in a nondiabetic I did not do a complete vascular exam today. I am going to get a baseline testosterone level. I will defer any further management to his primary care physician. We discussed mechanical prosthetic devices. He is not interested in this.  CC: Patient Care Team: L.Donnie Coffin, MD as PCP - General (Family Medicine)   Annia Belt, MD 6/26/201711:00 AM

## 2016-03-01 ENCOUNTER — Telehealth: Payer: Self-pay | Admitting: *Deleted

## 2016-03-01 LAB — COMPREHENSIVE METABOLIC PANEL
A/G RATIO: 1.5 (ref 1.2–2.2)
ALBUMIN: 4.1 g/dL (ref 3.5–4.8)
ALK PHOS: 66 IU/L (ref 39–117)
ALT: 26 IU/L (ref 0–44)
AST: 24 IU/L (ref 0–40)
BILIRUBIN TOTAL: 0.6 mg/dL (ref 0.0–1.2)
BUN / CREAT RATIO: 18 (ref 10–24)
BUN: 28 mg/dL — ABNORMAL HIGH (ref 8–27)
CO2: 20 mmol/L (ref 18–29)
Calcium: 9.2 mg/dL (ref 8.6–10.2)
Chloride: 106 mmol/L (ref 96–106)
Creatinine, Ser: 1.55 mg/dL — ABNORMAL HIGH (ref 0.76–1.27)
GFR calc Af Amer: 50 mL/min/{1.73_m2} — ABNORMAL LOW (ref >59)
GFR calc non Af Amer: 43 mL/min/{1.73_m2} — ABNORMAL LOW (ref >59)
GLOBULIN, TOTAL: 2.8 g/dL (ref 1.5–4.5)
Glucose: 150 mg/dL — ABNORMAL HIGH (ref 65–99)
POTASSIUM: 5.2 mmol/L (ref 3.5–5.2)
Sodium: 143 mmol/L (ref 134–144)
Total Protein: 6.9 g/dL (ref 6.0–8.5)

## 2016-03-01 LAB — CBC WITH DIFFERENTIAL
BASOS ABS: 0.1 10*3/uL (ref 0.0–0.2)
Basos: 1 %
EOS (ABSOLUTE): 0.2 10*3/uL (ref 0.0–0.4)
EOS: 4 %
HEMATOCRIT: 44.5 % (ref 37.5–51.0)
HEMOGLOBIN: 15 g/dL (ref 12.6–17.7)
Immature Grans (Abs): 0 10*3/uL (ref 0.0–0.1)
Immature Granulocytes: 0 %
LYMPHS ABS: 2 10*3/uL (ref 0.7–3.1)
Lymphs: 31 %
MCH: 32.3 pg (ref 26.6–33.0)
MCHC: 33.7 g/dL (ref 31.5–35.7)
MCV: 96 fL (ref 79–97)
MONOCYTES: 9 %
Monocytes Absolute: 0.6 10*3/uL (ref 0.1–0.9)
NEUTROS ABS: 3.7 10*3/uL (ref 1.4–7.0)
Neutrophils: 55 %
RBC: 4.65 x10E6/uL (ref 4.14–5.80)
RDW: 14 % (ref 12.3–15.4)
WBC: 6.6 10*3/uL (ref 3.4–10.8)

## 2016-03-01 LAB — TESTOSTERONE: TESTOSTERONE: 228 ng/dL — AB (ref 348–1197)

## 2016-03-01 LAB — FERRITIN: FERRITIN: 50 ng/mL (ref 30–400)

## 2016-03-01 NOTE — Telephone Encounter (Signed)
-----   Message from Annia Belt, MD sent at 03/01/2016  7:37 AM EDT ----- Call pt: ferritin iron storage protein excellent at 50.  Testosterone is decreased.  Blood sugar & kidney function are borderline. I will forward results to Dr [name].

## 2016-03-01 NOTE — Telephone Encounter (Signed)
Pt called / informed  ferritin excellent @ 50; testosterone level is decreased ; blood sugar and kidney fcn are borderline per Dr Beryle Beams. And he will forward results to his PCP Dr [name]. Pt voiced understanding.

## 2016-06-18 ENCOUNTER — Emergency Department (HOSPITAL_COMMUNITY)
Admission: EM | Admit: 2016-06-18 | Discharge: 2016-06-18 | Disposition: A | Discharge status: Home / Self Care | Payer: Medicare Other | Attending: Emergency Medicine | Admitting: Emergency Medicine

## 2016-06-18 ENCOUNTER — Encounter (HOSPITAL_COMMUNITY): Payer: Self-pay | Admitting: Neurology

## 2016-06-18 DIAGNOSIS — T63461A Toxic effect of venom of wasps, accidental (unintentional), initial encounter: Secondary | ICD-10-CM | POA: Diagnosis present

## 2016-06-18 DIAGNOSIS — I10 Essential (primary) hypertension: Secondary | ICD-10-CM | POA: Insufficient documentation

## 2016-06-18 DIAGNOSIS — Z96652 Presence of left artificial knee joint: Secondary | ICD-10-CM | POA: Insufficient documentation

## 2016-06-18 DIAGNOSIS — Z87891 Personal history of nicotine dependence: Secondary | ICD-10-CM | POA: Diagnosis not present

## 2016-06-18 DIAGNOSIS — R000 Tachycardia, unspecified: Secondary | ICD-10-CM | POA: Insufficient documentation

## 2016-06-18 DIAGNOSIS — T782XXA Anaphylactic shock, unspecified, initial encounter: Secondary | ICD-10-CM

## 2016-06-18 LAB — COMPREHENSIVE METABOLIC PANEL
ALBUMIN: 3.7 g/dL (ref 3.5–5.0)
ALK PHOS: 47 U/L (ref 38–126)
ALT: 21 U/L (ref 17–63)
AST: 23 U/L (ref 15–41)
Anion gap: 10 (ref 5–15)
BILIRUBIN TOTAL: 1.2 mg/dL (ref 0.3–1.2)
BUN: 24 mg/dL — AB (ref 6–20)
CALCIUM: 8.7 mg/dL — AB (ref 8.9–10.3)
CO2: 19 mmol/L — ABNORMAL LOW (ref 22–32)
CREATININE: 1.47 mg/dL — AB (ref 0.61–1.24)
Chloride: 110 mmol/L (ref 101–111)
GFR calc Af Amer: 52 mL/min — ABNORMAL LOW (ref >60)
GFR, EST NON AFRICAN AMERICAN: 45 mL/min — AB (ref >60)
GLUCOSE: 214 mg/dL — AB (ref 65–99)
POTASSIUM: 3.7 mmol/L (ref 3.5–5.1)
Sodium: 139 mmol/L (ref 135–145)
TOTAL PROTEIN: 6.2 g/dL — AB (ref 6.5–8.1)

## 2016-06-18 LAB — CBC WITH DIFFERENTIAL/PLATELET
BASOS ABS: 0 10*3/uL (ref 0.0–0.1)
BASOS PCT: 0 %
Eosinophils Absolute: 0 10*3/uL (ref 0.0–0.7)
Eosinophils Relative: 0 %
HEMATOCRIT: 37.8 % — AB (ref 39.0–52.0)
HEMOGLOBIN: 13.3 g/dL (ref 13.0–17.0)
LYMPHS PCT: 7 %
Lymphs Abs: 0.9 10*3/uL (ref 0.7–4.0)
MCH: 32.8 pg (ref 26.0–34.0)
MCHC: 35.2 g/dL (ref 30.0–36.0)
MCV: 93.1 fL (ref 78.0–100.0)
MONO ABS: 0.8 10*3/uL (ref 0.1–1.0)
Monocytes Relative: 6 %
NEUTROS ABS: 11.5 10*3/uL — AB (ref 1.7–7.7)
NEUTROS PCT: 87 %
Platelets: 224 10*3/uL (ref 150–400)
RBC: 4.06 MIL/uL — ABNORMAL LOW (ref 4.22–5.81)
RDW: 12.5 % (ref 11.5–15.5)
WBC: 13.3 10*3/uL — ABNORMAL HIGH (ref 4.0–10.5)

## 2016-06-18 LAB — I-STAT TROPONIN, ED
TROPONIN I, POC: 0 ng/mL (ref 0.00–0.08)
Troponin i, poc: 0.01 ng/mL (ref 0.00–0.08)

## 2016-06-18 MED ORDER — PREDNISONE 20 MG PO TABS: ORAL_TABLET | ORAL | 0 refills | Status: DC

## 2016-06-18 MED ORDER — DIPHENHYDRAMINE HCL 50 MG/ML IJ SOLN: 25.0000 mg | Freq: Once | INTRAMUSCULAR | Status: DC

## 2016-06-18 MED ORDER — PROMETHAZINE HCL 25 MG/ML IJ SOLN
12.5000 mg | Freq: Once | INTRAMUSCULAR | Status: AC
  Administered 2016-06-18: 12.5 mg via INTRAVENOUS
  Filled 2016-06-18: qty 1

## 2016-06-18 MED ORDER — LORAZEPAM 2 MG/ML IJ SOLN
INTRAMUSCULAR | Status: AC
  Filled 2016-06-18: qty 1

## 2016-06-18 MED ORDER — ACETAMINOPHEN 325 MG PO TABS
650.0000 mg | ORAL_TABLET | Freq: Once | ORAL | Status: AC
  Administered 2016-06-18: 650 mg via ORAL
  Filled 2016-06-18: qty 2

## 2016-06-18 MED ORDER — SODIUM CHLORIDE 0.9 % IV BOLUS (SEPSIS)
1000.0000 mL | Freq: Once | INTRAVENOUS | Status: AC
  Administered 2016-06-18: 1000 mL via INTRAVENOUS

## 2016-06-18 MED ORDER — METOCLOPRAMIDE HCL 5 MG/ML IJ SOLN: 10.0000 mg | Freq: Once | INTRAMUSCULAR | Status: DC

## 2016-06-18 MED ORDER — METHYLPREDNISOLONE SODIUM SUCC 125 MG IJ SOLR
125.0000 mg | Freq: Once | INTRAMUSCULAR | Status: AC
  Administered 2016-06-18: 125 mg via INTRAVENOUS
  Filled 2016-06-18: qty 2

## 2016-06-18 MED ORDER — LORAZEPAM 2 MG/ML IJ SOLN
0.5000 mg | Freq: Once | INTRAMUSCULAR | Status: AC
  Administered 2016-06-18: 0.5 mg via INTRAVENOUS

## 2016-06-18 MED ORDER — SODIUM CHLORIDE 0.9 % IV BOLUS (SEPSIS)
500.0000 mL | Freq: Once | INTRAVENOUS | Status: AC
  Administered 2016-06-18: 500 mL via INTRAVENOUS

## 2016-06-18 MED ORDER — EPINEPHRINE 0.3 MG/0.3ML IJ SOAJ: 0.3000 mg | Freq: Once | INTRAMUSCULAR | 0 refills | Status: AC

## 2016-06-18 NOTE — ED Triage Notes (Signed)
Per ems- this morning he was stung by 7 bees, had syncopal episode with unresponsive episodes while in the car on the way to hospital. Ems arrived BP 70 systolic, pt shaking given 50 IV benadryl, 0.5 epi IM. Pt went into VT with pulses x 2. Started amnio 150 mg drip. 5 albuterol, 0.5 atrovent. Pt very anxious now. HR 137 ST.

## 2016-06-18 NOTE — Discharge Instructions (Signed)
Carry epi pen with you and use it if you have trouble breathing, passing out.   Take prednisone as prescribed.   Take benadryl 25 mg every 6 hrs as needed for itchiness.  See your doctor.  Avoid bees  Return to ER if you have trouble breathing, uncontrolled rash, vomiting, fevers, chest pain

## 2016-06-18 NOTE — ED Provider Notes (Signed)
  Physical Exam  BP 157/98   Pulse 100   Resp 22   SpO2 98%   Physical Exam  ED Course  Procedures  MDM Care assumed at sign out. Patient was bitten by some bees and became hypotensive and tachycardic. ? Vtach per EMS but was given IM epi. Dr. Lita Mains and I reviewed tracing from EMS and saw no obvious V tach, just tremors. On monitor for the 6 hrs in the ED, No V tach. Delta trop neg. Given steroids in the ED and migraine cocktail and felt better. Had some rash on chest and hands on arrival that is improving. Likely anaphylaxis that improved. Will dc home with prednisone. Will give epi and instructed him how to use it.    Drenda Freeze, MD 06/18/16 [phone]

## 2016-06-18 NOTE — ED Notes (Signed)
Patient left at this time with all belongings. 

## 2016-06-18 NOTE — ED Provider Notes (Signed)
[facility] DEPT Provider Note   CSN: [id number] Arrival date & time: 06/18/16  1308     History   Chief Complaint Chief Complaint  Patient presents with  . Tachycardia    HPI Jeffery Carpenter is a 75 y.o. male.  HPI Patient was working in the yard this afternoon around noon. Was stung by multiple "yellow jackets" to the chest and arm. He believes he was stung 7 times. Was given 10 mL of children's Benadryl by his son. Was driving to the store to get more medicine when he became lightheaded and lost vision. Son states he had mild shaking in his bilateral hands. No rigidity. Speaking throughout. EMS was called and found the patient to be hypotensive. Given IM epinephrine, albuterol and Atrovent. Patient became increasingly tremulous and agitated. Increasing tachycardia. Concern for possible ventricular tachycardia on run sheet. Given amiodarone with no response. Patient denies any chest pain. No difficulty breathing. No intraoral swelling. No itching. Past Medical History:  Diagnosis Date  . Hemochromatosis   . HTN (hypertension), benign 02/06/2014   pt takes inderal for pre competition calmness  . 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 Other and unspecified hyperlipidemia 02/06/2014    Patient Active Problem List   Diagnosis Date Noted  . 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 Other and unspecified hyperlipidemia 02/06/2014  . HTN (hypertension), benign 02/06/2014  . Hemochromatosis 05/08/2012    Past Surgical History:  Procedure Laterality Date  . APPENDECTOMY    . BILATERAL CARPAL TUNNEL RELEASE Bilateral   . EYE SURGERY     cataract  . HERNIA REPAIR     abdomen  . JOINT REPLACEMENT     lt TKA  . KNEE ARTHROSCOPY Right   . MASS EXCISION Left 09/12/2014   Procedure: LEFT ELBOW AND FOREARM MASS REMOVAL;  Surgeon: Roseanne Kaufman, MD;  Location: [facility];  Service: Orthopedics;  Laterality: Left;  . TONSILLECTOMY    . WRIST SURGERY Left        Home  Medications    Prior to Admission medications   Medication Sig Start Date End Date Taking? Authorizing Provider  Acetaminophen (TYLENOL PO) Take 2 tablets by mouth every 6 (six) hours as needed (pain).   Yes Historical Provider, MD  diphenhydrAMINE (BENADRYL) 12.5 MG/5ML elixir Take 5 mg by mouth once as needed (possible allergic reaction).   Yes Historical Provider, MD  folic acid (FOLVITE) 1 MG tablet Take 1 tablet (1 mg total) by mouth daily. 11/19/15  Yes Annia Belt, MD  pravastatin (PRAVACHOL) 40 MG tablet Take 40 mg by mouth daily.     Yes Historical Provider, MD  propranolol (INDERAL) 40 MG tablet Take 40 mg by mouth See admin instructions. Take 1 tablet (40 mg) by mouth at night and the next morning prior to shooting competitions twice month   Yes Historical Provider, MD  [facility] PO Take 2 capsules by mouth daily.   Yes Historical Provider, MD  thiamine (VITAMIN B-1) 50 MG tablet Take 50 mg by mouth daily.     Yes Historical Provider, MD  triamcinolone cream (KENALOG) 0.5 % Apply 1 application topically 3 (three) times daily as needed (itching/ insect bites).   Yes Historical Provider, MD  HYDROcodone-acetaminophen (NORCO) 5-325 MG per tablet Take 2 tablets by mouth every 6 (six) hours as needed for moderate pain. Patient not taking: Reported on 06/18/2016 09/12/14   Roseanne Kaufman, MD  predniSONE (DELTASONE) 20 MG tablet Take  60 mg daily 2 days then 40 mg daily x 2 days then 20 mg daily x 2 days 06/18/16   Drenda Freeze, MD    Family History No family history on file.  Social History Social History  Substance Use Topics  . Smoking status: Former Smoker    Quit date: 02/07/1960  . Smokeless tobacco: Not on file  . Alcohol use 0.0 oz/week     Comment: social     Allergies   Cucumber extract; Onion; and Penicillins   Review of Systems Review of Systems  Constitutional: Negative for chills and fever.  HENT: Negative for facial swelling, trouble swallowing and  voice change.   Eyes: Positive for visual disturbance.  Respiratory: Negative for cough, shortness of breath and wheezing.   Cardiovascular: Positive for palpitations. Negative for chest pain and leg swelling.  Gastrointestinal: Negative for abdominal pain, nausea and vomiting.  Genitourinary: Negative for flank pain.  Musculoskeletal: Negative for back pain, myalgias, neck pain and neck stiffness.  Skin: Negative for rash and wound.  Neurological: Positive for tremors, syncope and light-headedness. Negative for weakness, numbness and headaches.  All other systems reviewed and are negative.    Physical Exam Updated Vital Signs BP 157/98   Pulse 100   Resp 22   SpO2 98%   Physical Exam  Constitutional: He is oriented to person, place, and time. He appears well-developed and well-nourished.  Patient is anxious and tremulous.  HENT:  Head: Normocephalic and atraumatic.  Mouth/Throat: Oropharynx is clear and moist. No oropharyngeal exudate.  No intraoral swelling.  Eyes: EOM are normal. Pupils are equal, round, and reactive to light.  Neck: Normal range of motion. Neck supple.  Cardiovascular: Regular rhythm.  Exam reveals no gallop and no friction rub.   No murmur heard. Tachycardia  Pulmonary/Chest: Effort normal and breath sounds normal. No stridor. No respiratory distress. He has no wheezes. He has no rales. He exhibits no tenderness.  Abdominal: Soft. Bowel sounds are normal. There is no tenderness. There is no rebound and no guarding.  Musculoskeletal: Normal range of motion. He exhibits no edema or tenderness.  No lower extremity swelling, asymmetry or tenderness. 2+ distal pulses.  Lymphadenopathy:    He has no cervical adenopathy.  Neurological: He is alert and oriented to person, place, and time.  Patient with tremor in all extremities. 5/5 motor in all extremities. Sensation is fully intact.  Skin: Skin is warm and dry. Capillary refill takes less than 2 seconds. No  rash noted. No erythema.  Psychiatric:  Anxious appearing  Nursing note and vitals reviewed.    ED Treatments / Results  Labs (all labs ordered are listed, but only abnormal results are displayed) Labs Reviewed  CBC WITH DIFFERENTIAL/PLATELET - Abnormal; Notable for the following:       Result Value   WBC 13.3 (*)    RBC 4.06 (*)    HCT 37.8 (*)    Neutro Abs 11.5 (*)    All other components within normal limits  COMPREHENSIVE METABOLIC PANEL - Abnormal; Notable for the following:    CO2 19 (*)    Glucose, Bld 214 (*)    BUN 24 (*)    Creatinine, Ser 1.47 (*)    Calcium 8.7 (*)    Total Protein 6.2 (*)    GFR calc non Af Amer 45 (*)    GFR calc Af Amer 52 (*)    All other components within normal limits  I-STAT TROPOININ, ED  I-STAT  TROPOININ, ED    EKG  EKG Interpretation  Date/Time:  Saturday June 18 2016 13:20:51 EDT Ventricular Rate:  103 PR Interval:    QRS Duration: 97 QT Interval:  373 QTC Calculation: 489 R Axis:   36 Text Interpretation:  Sinus tachycardia Abnormal R-wave progression, early transition Borderline prolonged QT interval No significant change since last tracing Confirmed by YAO  MD, Alease Fait ([id number]) on 06/18/2016 6:40:43 PM       Radiology No results found.  Procedures Procedures (including critical care time)  Medications Ordered in ED Medications  LORazepam (ATIVAN) injection 0.5 mg (0.5 mg Intravenous Given 06/18/16 1319)  sodium chloride 0.9 % bolus 500 mL (0 mLs Intravenous Stopped 06/18/16 1435)  acetaminophen (TYLENOL) tablet 650 mg (650 mg Oral Given 06/18/16 1507)  methylPREDNISolone sodium succinate (SOLU-MEDROL) 125 mg/2 mL injection 125 mg (125 mg Intravenous Given 06/18/16 1554)  sodium chloride 0.9 % bolus 1,000 mL (0 mLs Intravenous Stopped 06/18/16 1713)  promethazine (PHENERGAN) injection 12.5 mg (12.5 mg Intravenous Given 06/18/16 1624)     Initial Impression / Assessment and Plan / ED Course  I have reviewed the  triage vital signs and the nursing notes.  Pertinent labs & imaging results that were available during my care of the patient were reviewed by me and considered in my medical decision making (see chart for details).  Clinical Course  Doubt ventricular tachycardia. Likely related to artifact from shaking that was exacerbated by epinephrine and albuterol. Patient states he has a baseline tremor for which he takes a beta blocker for. His tremor improved with small dose of Ativan and he is resting calmly. Rhythm is sinus. Given concern for anaphylaxis was given Solu-Medrol. Will observe in the emergency department but anticipate discharge home if remains hemodynamically stable. Signed out to oncoming emergency physician. Final Clinical Impressions(s) / ED Diagnoses   Final diagnoses:  Anaphylaxis, initial encounter    New Prescriptions Discharge Medication List as of 06/18/2016  7:09 PM    START taking these medications   Details  EPINEPHrine 0.3 mg/0.3 mL IJ SOAJ injection Inject 0.3 mLs (0.3 mg total) into the muscle once., Starting Sat 06/18/2016, Print    predniSONE (DELTASONE) 20 MG tablet Take 60 mg daily 2 days then 40 mg daily x 2 days then 20 mg daily x 2 days, Print         Julianne Rice, MD 06/19/16 [phone]

## 2016-12-26 ENCOUNTER — Other Ambulatory Visit: Payer: Self-pay | Admitting: Oncology

## 2017-04-14 ENCOUNTER — Other Ambulatory Visit (INDEPENDENT_AMBULATORY_CARE_PROVIDER_SITE_OTHER): Payer: Medicare Other

## 2017-04-15 LAB — COMPREHENSIVE METABOLIC PANEL
ALBUMIN: 4.1 g/dL (ref 3.5–4.8)
ALK PHOS: 67 IU/L (ref 39–117)
ALT: 20 IU/L (ref 0–44)
AST: 18 IU/L (ref 0–40)
Albumin/Globulin Ratio: 1.7 (ref 1.2–2.2)
BILIRUBIN TOTAL: 0.8 mg/dL (ref 0.0–1.2)
BUN/Creatinine Ratio: 13 (ref 10–24)
BUN: 17 mg/dL (ref 8–27)
CHLORIDE: 107 mmol/L — AB (ref 96–106)
CO2: 25 mmol/L (ref 20–29)
CREATININE: 1.29 mg/dL — AB (ref 0.76–1.27)
Calcium: 9 mg/dL (ref 8.6–10.2)
GFR calc Af Amer: 61 mL/min/{1.73_m2} (ref >59)
GFR calc non Af Amer: 53 mL/min/{1.73_m2} — ABNORMAL LOW (ref >59)
GLOBULIN, TOTAL: 2.4 g/dL (ref 1.5–4.5)
Glucose: 123 mg/dL — ABNORMAL HIGH (ref 65–99)
Potassium: 4.6 mmol/L (ref 3.5–5.2)
Sodium: 145 mmol/L — ABNORMAL HIGH (ref 134–144)
Total Protein: 6.5 g/dL (ref 6.0–8.5)

## 2017-04-15 LAB — CBC WITH DIFFERENTIAL/PLATELET
BASOS ABS: 0 10*3/uL (ref 0.0–0.2)
Basos: 1 %
EOS (ABSOLUTE): 0.2 10*3/uL (ref 0.0–0.4)
EOS: 4 %
HEMATOCRIT: 41.8 % (ref 37.5–51.0)
HEMOGLOBIN: 14.2 g/dL (ref 13.0–17.7)
Immature Grans (Abs): 0 10*3/uL (ref 0.0–0.1)
Immature Granulocytes: 0 %
LYMPHS ABS: 2.1 10*3/uL (ref 0.7–3.1)
Lymphs: 34 %
MCH: 32.2 pg (ref 26.6–33.0)
MCHC: 34 g/dL (ref 31.5–35.7)
MCV: 95 fL (ref 79–97)
MONOCYTES: 11 %
MONOS ABS: 0.7 10*3/uL (ref 0.1–0.9)
NEUTROS ABS: 3.2 10*3/uL (ref 1.4–7.0)
Neutrophils: 50 %
Platelets: 303 10*3/uL (ref 150–379)
RBC: 4.41 x10E6/uL (ref 4.14–5.80)
RDW: 13.7 % (ref 12.3–15.4)
WBC: 6.2 10*3/uL (ref 3.4–10.8)

## 2017-04-15 LAB — FERRITIN: FERRITIN: 43 ng/mL (ref 30–400)

## 2017-04-18 ENCOUNTER — Telehealth: Payer: Self-pay | Admitting: *Deleted

## 2017-04-18 NOTE — Telephone Encounter (Signed)
-----   Message from Annia Belt, MD sent at 04/17/2017  6:16 PM EDT ----- Call pt: Jeffery Carpenter remains in excellent range at 43. Does he need new orders for phlebotomy?

## 2017-04-18 NOTE — Telephone Encounter (Signed)
Pt called / informed "ferritin remains in excellent range at 43." per Dr Beryle Beams. Stated he does not need new order for phlebotomy at this time. He will see [place] on Monday.

## 2017-04-24 ENCOUNTER — Encounter: Payer: Self-pay | Admitting: Oncology

## 2017-04-24 ENCOUNTER — Ambulatory Visit (INDEPENDENT_AMBULATORY_CARE_PROVIDER_SITE_OTHER): Payer: Medicare Other | Admitting: Oncology

## 2017-04-24 DIAGNOSIS — N529 Male erectile dysfunction, unspecified: Secondary | ICD-10-CM

## 2017-04-24 DIAGNOSIS — Z880 Allergy status to penicillin: Secondary | ICD-10-CM

## 2017-04-24 DIAGNOSIS — Z148 Genetic carrier of other disease: Secondary | ICD-10-CM

## 2017-04-24 NOTE — Patient Instructions (Signed)
Lab in 6 months and repeat in 1 year MD visit 1 week after visit in 1 year

## 2017-04-25 NOTE — Progress Notes (Signed)
Hematology and Oncology Follow Up Visit  DAMEL QUERRY [id number] 1940-12-29 76 y.o. 04/25/2017 4:42 PM   Principle Diagnosis: Encounter Diagnosis  Name Primary?  . 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 He was diagnosed back in 2001. Full records not available . He was diagnosed at the same time that his brother was also found to have a hemachromatosis. He tells me that his initial ferritin was 883 units. He was initially on a weekly phlebotomy program and then, over time, on a every 3 month maintenance program. In general his ferritins run less than 100. Marland Kitchen  He has never had clinical hemachromatosis. His brother however , died of complications of hemachromatosis in June 2013 at age 60. Another brother died at age 55 of lung cancer.  His liver functions have been normal. Subsequent to his first visit with me in 2014, I suggested he return to the [facility] to see if they would be willing to do phlebotomy for him. They were and he has been getting phlebotomies every 2 months.  He has a son aged 18 (2017), and a daughter aged 55(2017). He states that both of them have been checked but he doesn't know their status. Neither one has been put on a phlebotomy program.  Interim History:   He continues to do well.  He has had no interim medical problems.  He continues to donate blood every 2 months.  Lab done on April 14, 2017 in anticipation of today's visit with hemoglobin 14, ferritin 43, liver functions all normal.  Creatinine is 1.3.  This compares with 1.5 done in October 2017. He has no cardiorespiratory complaints.  No GI complaints.  He is still having problems with erectile dysfunction.  Medications: reviewed  Allergies:  Allergies  Allergen Reactions  . Cucumber Extract Other (See Comments)    Cucumbers cause migraine headaches  . Onion Other (See  Comments)    Onions cause migraine headaches  . Penicillins Other (See Comments)    Ineffective per pt    Review of Systems: See interim history Remaining ROS negative:   Physical Exam: Blood pressure (!) 141/84, pulse 76, temperature (!) 97.4 F (36.3 C), temperature source Oral, height 5\' 8"  (1.727 m), weight 192 lb 14.4 oz (87.5 kg), SpO2 99 %. Wt Readings from Last 3 Encounters:  04/24/17 192 lb 14.4 oz (87.5 kg)  02/29/16 190 lb 1.6 oz (86.2 kg)  02/16/15 190 lb 8 oz (86.4 kg)     General appearance: Well-nourished Caucasian man HENNT: Pharynx no erythema, exudate, mass, or ulcer. No thyromegaly or thyroid nodules Lymph nodes: No cervical, supraclavicular, or axillary lymphadenopathy Breasts: Lungs: Clear to auscultation, resonant to percussion throughout Heart: Regular rhythm, no murmur, no gallop, no rub, no click, no edema Abdomen: Soft, nontender, normal bowel sounds, no mass, no organomegaly Extremities: No edema, no calf tenderness Musculoskeletal: no joint deformities GU:  Vascular: Carotid pulses 2+, no bruits, Neurologic: Alert, oriented, PERRLA, optic discs sharp and vessels normal, no hemorrhage or exudate, cranial nerves grossly normal, motor strength 5 over 5, reflexes 1+ symmetric, upper body coordination normal, gait normal, Skin: No rash or ecchymosis  Lab Results: CBC W/Diff    Component Value Date/Time   WBC 6.2 04/14/2017 1051   WBC 13.3 (H) 06/18/2016 1400   RBC 4.41 04/14/2017 1051   RBC 4.06 (L) 06/18/2016 1400   HGB 14.2 04/14/2017 1051   HGB 14.3  01/02/2014 1500   HCT 41.8 04/14/2017 1051   HCT 39.9 01/02/2014 1500   PLT 303 04/14/2017 1051   MCV 95 04/14/2017 1051   MCV 92.4 01/02/2014 1500   MCH 32.2 04/14/2017 1051   MCH 32.8 06/18/2016 1400   MCHC 34.0 04/14/2017 1051   MCHC 35.2 06/18/2016 1400   RDW 13.7 04/14/2017 1051   RDW 13.2 01/02/2014 1500   LYMPHSABS 2.1 04/14/2017 1051   LYMPHSABS 2.5 01/02/2014 1500   MONOABS 0.8  06/18/2016 1400   MONOABS 0.6 01/02/2014 1500   EOSABS 0.2 04/14/2017 1051   BASOSABS 0.0 04/14/2017 1051   BASOSABS 0.1 01/02/2014 1500     Chemistry      Component Value Date/Time   NA 145 (H) 04/14/2017 1051   NA 144 08/22/2012 1340   K 4.6 04/14/2017 1051   K 4.5 08/22/2012 1340   CL 107 (H) 04/14/2017 1051   CL 104 08/22/2012 1340   CO2 25 04/14/2017 1051   CO2 27 08/22/2012 1340   BUN 17 04/14/2017 1051   BUN 19.0 08/22/2012 1340   CREATININE 1.29 (H) 04/14/2017 1051   CREATININE 1.30 02/16/2015 1530   CREATININE 1.2 08/22/2012 1340      Component Value Date/Time   CALCIUM 9.0 04/14/2017 1051   CALCIUM 9.6 08/22/2012 1340   ALKPHOS 67 04/14/2017 1051   ALKPHOS 72 08/22/2012 1340   AST 18 04/14/2017 1051   AST 23 08/22/2012 1340   ALT 20 04/14/2017 1051   ALT 39 08/22/2012 1340   BILITOT 0.8 04/14/2017 1051   BILITOT 1.15 08/22/2012 1340     Ferritin 43 on April 14, 2017  Radiological Studies: No results found.  Impression:  #1. 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 No clinical or laboratory evidence of hemochromatosis. His ferritin levels remain in optimal range and liver functions are normal on every 2 month phlebotomy/blood donation program. I will continue annual follow-up visits.  #2. Erectile dysfunction in a nondiabetic. He is still not interested in any prosthetic devices.  Medical therapy and effective so far.  CC: Patient Care Team: [name], L.Marlou Sa, MD as PCP - General (Family Medicine)   Murriel Hopper, MD, [facility]  Hematology-Oncology/Internal Medicine     8/21/20184:42 PM

## 2017-12-07 ENCOUNTER — Other Ambulatory Visit: Payer: Medicare Other

## 2017-12-11 ENCOUNTER — Other Ambulatory Visit (INDEPENDENT_AMBULATORY_CARE_PROVIDER_SITE_OTHER): Payer: Medicare Other

## 2017-12-11 ENCOUNTER — Encounter (INDEPENDENT_AMBULATORY_CARE_PROVIDER_SITE_OTHER): Payer: Self-pay

## 2017-12-12 ENCOUNTER — Telehealth: Payer: Self-pay | Admitting: *Deleted

## 2017-12-12 LAB — CBC WITH DIFFERENTIAL/PLATELET
BASOS ABS: 0.1 10*3/uL (ref 0.0–0.2)
Basos: 1 %
EOS (ABSOLUTE): 0.2 10*3/uL (ref 0.0–0.4)
Eos: 3 %
Hematocrit: 45.6 % (ref 37.5–51.0)
Hemoglobin: 15.4 g/dL (ref 13.0–17.7)
IMMATURE GRANS (ABS): 0 10*3/uL (ref 0.0–0.1)
Immature Granulocytes: 0 %
LYMPHS ABS: 2.3 10*3/uL (ref 0.7–3.1)
LYMPHS: 35 %
MCH: 32.4 pg (ref 26.6–33.0)
MCHC: 33.8 g/dL (ref 31.5–35.7)
MCV: 96 fL (ref 79–97)
Monocytes Absolute: 0.5 10*3/uL (ref 0.1–0.9)
Monocytes: 8 %
NEUTROS ABS: 3.5 10*3/uL (ref 1.4–7.0)
Neutrophils: 53 %
PLATELETS: 256 10*3/uL (ref 150–379)
RBC: 4.76 x10E6/uL (ref 4.14–5.80)
RDW: 13.5 % (ref 12.3–15.4)
WBC: 6.6 10*3/uL (ref 3.4–10.8)

## 2017-12-12 LAB — COMPREHENSIVE METABOLIC PANEL
A/G RATIO: 1.9 (ref 1.2–2.2)
ALK PHOS: 71 IU/L (ref 39–117)
ALT: 29 IU/L (ref 0–44)
AST: 28 IU/L (ref 0–40)
Albumin: 4.3 g/dL (ref 3.5–4.8)
BILIRUBIN TOTAL: 0.9 mg/dL (ref 0.0–1.2)
BUN/Creatinine Ratio: 18 (ref 10–24)
BUN: 24 mg/dL (ref 8–27)
CHLORIDE: 106 mmol/L (ref 96–106)
CO2: 20 mmol/L (ref 20–29)
Calcium: 9.2 mg/dL (ref 8.6–10.2)
Creatinine, Ser: 1.32 mg/dL — ABNORMAL HIGH (ref 0.76–1.27)
GFR calc non Af Amer: 51 mL/min/{1.73_m2} — ABNORMAL LOW (ref >59)
GFR, EST AFRICAN AMERICAN: 59 mL/min/{1.73_m2} — AB (ref >59)
Globulin, Total: 2.3 g/dL (ref 1.5–4.5)
Glucose: 165 mg/dL — ABNORMAL HIGH (ref 65–99)
POTASSIUM: 4.4 mmol/L (ref 3.5–5.2)
Sodium: 141 mmol/L (ref 134–144)
TOTAL PROTEIN: 6.6 g/dL (ref 6.0–8.5)

## 2017-12-12 LAB — FERRITIN: Ferritin: 48 ng/mL (ref 30–400)

## 2017-12-12 NOTE — Telephone Encounter (Signed)
Called pt - no answer; left message to give me a call back. 

## 2017-12-12 NOTE — Telephone Encounter (Signed)
-----   Message from Annia Belt, MD sent at 12/12/2017 10:01 AM EDT ----- Call pt: ferritin good at 48. Continue every 2 month phlebotomy; keep appt w me in August

## 2017-12-13 NOTE — Telephone Encounter (Signed)
Called pt again; no answer; left message to give me a call back.

## 2017-12-13 NOTE — Telephone Encounter (Signed)
Return call from pt - informed "ferritin good at 48. Continue every 2 month phlebotomy; keep appt w me in August" per Dr Beryle Beams. Stated he will be donating blood this month and he will be seeing Dr Darnell Level this Friday 4/12 - informed he does not have an appt this Friday. I will ask Josephina Shih to call pt back to schedule an appt in Aug.  Doris stated she talked to pt - informed he has a lab appt 8/12. Stated he was confused about the date. And she will call him back once she has Dr Synthia Innocent schedule for Aug.

## 2017-12-31 ENCOUNTER — Other Ambulatory Visit: Payer: Self-pay | Admitting: Oncology

## 2018-01-02 ENCOUNTER — Telehealth: Payer: Self-pay | Admitting: *Deleted

## 2018-01-02 NOTE — Telephone Encounter (Signed)
Received message - pt requesting refill; did not state which med but folic acid was refilled yesterday per Dr Beryle Beams.  Called pt - no answer; left message about refill and to call if he has any questions.

## 2018-04-16 ENCOUNTER — Encounter (INDEPENDENT_AMBULATORY_CARE_PROVIDER_SITE_OTHER): Payer: Self-pay

## 2018-04-16 ENCOUNTER — Other Ambulatory Visit (INDEPENDENT_AMBULATORY_CARE_PROVIDER_SITE_OTHER): Payer: Medicare Other

## 2018-04-17 ENCOUNTER — Telehealth: Payer: Self-pay | Admitting: *Deleted

## 2018-04-17 LAB — COMPREHENSIVE METABOLIC PANEL
A/G RATIO: 1.5 (ref 1.2–2.2)
ALBUMIN: 3.9 g/dL (ref 3.5–4.8)
ALT: 18 IU/L (ref 0–44)
AST: 20 IU/L (ref 0–40)
Alkaline Phosphatase: 62 IU/L (ref 39–117)
BUN / CREAT RATIO: 16 (ref 10–24)
BUN: 20 mg/dL (ref 8–27)
Bilirubin Total: 0.7 mg/dL (ref 0.0–1.2)
CO2: 20 mmol/L (ref 20–29)
Calcium: 8.9 mg/dL (ref 8.6–10.2)
Chloride: 109 mmol/L — ABNORMAL HIGH (ref 96–106)
Creatinine, Ser: 1.28 mg/dL — ABNORMAL HIGH (ref 0.76–1.27)
GFR calc non Af Amer: 53 mL/min/{1.73_m2} — ABNORMAL LOW (ref >59)
GFR, EST AFRICAN AMERICAN: 62 mL/min/{1.73_m2} (ref >59)
GLOBULIN, TOTAL: 2.6 g/dL (ref 1.5–4.5)
GLUCOSE: 175 mg/dL — AB (ref 65–99)
Potassium: 4.4 mmol/L (ref 3.5–5.2)
SODIUM: 143 mmol/L (ref 134–144)
TOTAL PROTEIN: 6.5 g/dL (ref 6.0–8.5)

## 2018-04-17 LAB — CBC WITH DIFFERENTIAL/PLATELET
BASOS ABS: 0 10*3/uL (ref 0.0–0.2)
Basos: 1 %
EOS (ABSOLUTE): 0.2 10*3/uL (ref 0.0–0.4)
Eos: 5 %
HEMATOCRIT: 43.3 % (ref 37.5–51.0)
HEMOGLOBIN: 14.9 g/dL (ref 13.0–17.7)
Immature Grans (Abs): 0 10*3/uL (ref 0.0–0.1)
Immature Granulocytes: 0 %
LYMPHS ABS: 1.7 10*3/uL (ref 0.7–3.1)
Lymphs: 33 %
MCH: 33.3 pg — AB (ref 26.6–33.0)
MCHC: 34.4 g/dL (ref 31.5–35.7)
MCV: 97 fL (ref 79–97)
MONOCYTES: 7 %
Monocytes Absolute: 0.4 10*3/uL (ref 0.1–0.9)
NEUTROS ABS: 2.9 10*3/uL (ref 1.4–7.0)
Neutrophils: 54 %
Platelets: 237 10*3/uL (ref 150–450)
RBC: 4.47 x10E6/uL (ref 4.14–5.80)
RDW: 13.5 % (ref 12.3–15.4)
WBC: 5.2 10*3/uL (ref 3.4–10.8)

## 2018-04-17 LAB — FERRITIN: Ferritin: 49 ng/mL (ref 30–400)

## 2018-04-17 NOTE — Telephone Encounter (Signed)
Called pt - no answer; left message to give me a call back. 

## 2018-04-17 NOTE — Telephone Encounter (Signed)
-----   Message from Annia Belt, MD sent at 04/17/2018  6:52 AM EDT ----- Call pt: ferritin remains in excellent range at 49

## 2018-04-18 NOTE — Telephone Encounter (Signed)
Pt informed "ferritin remains in excellent range at 49 " per Dr Beryle Beams. Stated he will see [place] on the 26th.

## 2018-04-30 ENCOUNTER — Encounter: Payer: Self-pay | Admitting: Oncology

## 2018-04-30 ENCOUNTER — Other Ambulatory Visit: Payer: Self-pay

## 2018-04-30 ENCOUNTER — Ambulatory Visit: Payer: Medicare Other | Admitting: Oncology

## 2018-04-30 DIAGNOSIS — Z832 Family history of diseases of the blood and blood-forming organs and certain disorders involving the immune mechanism: Secondary | ICD-10-CM

## 2018-04-30 DIAGNOSIS — E039 Hypothyroidism, unspecified: Secondary | ICD-10-CM

## 2018-04-30 DIAGNOSIS — Z880 Allergy status to penicillin: Secondary | ICD-10-CM

## 2018-04-30 DIAGNOSIS — Z79890 Hormone replacement therapy (postmenopausal): Secondary | ICD-10-CM | POA: Diagnosis not present

## 2018-04-30 DIAGNOSIS — N529 Male erectile dysfunction, unspecified: Secondary | ICD-10-CM | POA: Diagnosis not present

## 2018-04-30 DIAGNOSIS — Z91018 Allergy to other foods: Secondary | ICD-10-CM

## 2018-04-30 NOTE — Patient Instructions (Addendum)
Return visit to Dr Darnell Level as needed before April 2020  Please schedule new patient visit with any MD at [facility]  After 09/05/18 C282Y homozygote for hemachromatosis gene. [facility].

## 2018-04-30 NOTE — Progress Notes (Signed)
Hematology and Oncology Follow Up Visit  Jeffery Carpenter [id number] Mar 21, 1941 77 y.o. 04/30/2018 12:33 PM   Principle Diagnosis: Encounter Diagnosis  Name Primary?  . 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 He was diagnosed back in 2001. Full records not available . He was diagnosed at the same time that his brother was also found to have a hemachromatosis. He tells me that his initial ferritin was 883 units. He was initially on a weekly phlebotomy program and then, over time, on a every 3 month maintenance program. In general his ferritins run less than 100. Marland Kitchen  He has never had clinical hemachromatosis. His brother however , died of complications of hemachromatosis in June 2013 at age 97. Another brother died at age 29 of lung cancer.  His liver functions have been normal. Subsequent to his first visit with me in 2014, I suggested he return to the [facility] to see if they would be willing to do phlebotomy for him. They were and he has been getting phlebotomies every 2 months.  He has a son aged 68 (2017), and a daughter aged 19(2017). He states that both of them have been checked but he doesn't know their status. Neither one has been put on a phlebotomy program.  Interim History: Overall he continues to do well.  He is compliant with his every 14-month phlebotomies which he does at his local [facility].  Lab in anticipation of today's visit on August 12 shows all parameters in good range with hemoglobin 14.9, ferritin 49, and normal liver functions. He has had no interim medical problems but he states that he was put on Synthroid for hypothyroidism and had a bad reaction to it.  He also reacted to topical Synthroid and states that he has to get injections. He reports that he has had multiple meds for erectile dysfunction but none of them work and he is not interested  in a mechanical solution. He started something called predigent, and over the counter supplement made from jellyfish which is supposed to improve your memory. LOL.  Medications: reviewed  Allergies:  Allergies  Allergen Reactions  . Chocolate Other (See Comments)    Causes migraines.  . Cucumber Extract Other (See Comments)    Cucumbers cause migraine headaches  . Onion Other (See Comments)    Onions cause migraine headaches  . Penicillins Other (See Comments)    Ineffective per pt    Review of Systems: See interim history Remaining ROS negative:   Physical Exam: Blood pressure 122/86, pulse 74, temperature 97.7 F (36.5 C), temperature source Oral, height 5\' 8"  (1.727 m), weight 188 lb 1.6 oz (85.3 kg), SpO2 99 %. Wt Readings from Last 3 Encounters:  04/30/18 188 lb 1.6 oz (85.3 kg)  04/24/17 192 lb 14.4 oz (87.5 kg)  02/29/16 190 lb 1.6 oz (86.2 kg)     General appearance: Well-nourished Caucasian man HENNT: Pharynx no erythema, exudate, mass, or ulcer. No thyromegaly or thyroid nodules Lymph nodes: No cervical, supraclavicular, or axillary lymphadenopathy Breasts:  Lungs: Clear to auscultation, resonant to percussion throughout Heart: Regular rhythm, no murmur, no gallop, no rub, no click, no edema Abdomen: Soft, nontender, normal bowel sounds, no mass, no organomegaly Extremities: No edema, no calf tenderness Musculoskeletal: no joint deformities GU:  Vascular: Carotid pulses 2+, no bruits,  Neurologic: Alert, oriented, PERRLA, I was unable to visualize optic discs cranial nerves grossly normal,  motor strength 5 over 5, reflexes 1+ symmetric, upper body coordination normal, gait normal, Skin: No rash or ecchymosis  Lab Results: CBC W/Diff    Component Value Date/Time   WBC 5.2 04/16/2018 1137   WBC 13.3 (H) 06/18/2016 1400   RBC 4.47 04/16/2018 1137   RBC 4.06 (L) 06/18/2016 1400   HGB 14.9 04/16/2018 1137   HGB 14.3 01/02/2014 1500   HCT 43.3 04/16/2018  1137   HCT 39.9 01/02/2014 1500   PLT 237 04/16/2018 1137   MCV 97 04/16/2018 1137   MCV 92.4 01/02/2014 1500   MCH 33.3 (H) 04/16/2018 1137   MCH 32.8 06/18/2016 1400   MCHC 34.4 04/16/2018 1137   MCHC 35.2 06/18/2016 1400   RDW 13.5 04/16/2018 1137   RDW 13.2 01/02/2014 1500   LYMPHSABS 1.7 04/16/2018 1137   LYMPHSABS 2.5 01/02/2014 1500   MONOABS 0.8 06/18/2016 1400   MONOABS 0.6 01/02/2014 1500   EOSABS 0.2 04/16/2018 1137   BASOSABS 0.0 04/16/2018 1137   BASOSABS 0.1 01/02/2014 1500     Chemistry      Component Value Date/Time   NA 143 04/16/2018 1137   NA 144 08/22/2012 1340   K 4.4 04/16/2018 1137   K 4.5 08/22/2012 1340   CL 109 (H) 04/16/2018 1137   CL 104 08/22/2012 1340   CO2 20 04/16/2018 1137   CO2 27 08/22/2012 1340   BUN 20 04/16/2018 1137   BUN 19.0 08/22/2012 1340   CREATININE 1.28 (H) 04/16/2018 1137   CREATININE 1.30 02/16/2015 1530   CREATININE 1.2 08/22/2012 1340      Component Value Date/Time   CALCIUM 8.9 04/16/2018 1137   CALCIUM 9.6 08/22/2012 1340   ALKPHOS 62 04/16/2018 1137   ALKPHOS 72 08/22/2012 1340   AST 20 04/16/2018 1137   AST 23 08/22/2012 1340   ALT 18 04/16/2018 1137   ALT 39 08/22/2012 1340   BILITOT 0.7 04/16/2018 1137   BILITOT 1.15 08/22/2012 1340       Radiological Studies: No results found.  Impression:  Homozygote status for the C282Y hemochromatosis gene. Excellent control on a long-term phlebotomy program. Plan: Continue the same  I told him I would be retiring next spring.  I will refer him to the [facility] center for his ongoing hematology care and annual follow-ups.  CC: Patient Care Team: [name], L.Marlou Sa, MD as PCP - General (Family Medicine)   Murriel Hopper, MD, [facility]  Hematology-Oncology/Internal Medicine     8/26/201912:33 PM

## 2018-05-17 ENCOUNTER — Telehealth: Payer: Self-pay | Admitting: Family Medicine

## 2018-06-08 ENCOUNTER — Other Ambulatory Visit: Payer: Self-pay | Admitting: Oncology

## 2018-11-20 ENCOUNTER — Encounter: Payer: Self-pay | Admitting: *Deleted

## 2018-11-30 ENCOUNTER — Telehealth: Payer: Self-pay | Admitting: Hematology

## 2018-11-30 ENCOUNTER — Encounter: Payer: Self-pay | Admitting: Hematology

## 2018-11-30 NOTE — Telephone Encounter (Signed)
A new hem appt has been scheduled for the pt to see Dr. Irene Limbo on 4/30 at 10am. Letter mailed to the pt

## 2019-01-02 NOTE — Progress Notes (Signed)
HEMATOLOGY/ONCOLOGY CONSULTATION NOTE  Date of Service: 01/03/2019  Patient Care Team: [name], L.Marlou Sa, MD as PCP - General (Family Medicine)  CHIEF COMPLAINTS/PURPOSE OF CONSULTATION:  Hereditary Hemochromatosis  HISTORY OF PRESENTING ILLNESS:   Jeffery Carpenter is a wonderful 78 y.o. male who has been referred to [place] by Dr. 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 The pt reports that he is doing well overall.   The pt reports that he was first diagnosed with homozygous hereditary hemochromatosis in 2001, pursuing testing after learning that his brother had the homozygous disease, who died of liver cancer. Both of the patient's parents were carriers. The pt notes that his Ferritin was 883 upon initial diagnosis 20 years ago, and prior to his diagnosis he had donated blood a couple times a year. He is not positive that he has the homozygous mutation, and molecular records of his diagnosis are not available. The pt currently donates blood to the [facility] every 2 months or so, and notes his Ferritin remains around 50. The pt notes that he sees his PCP every 6 months with labs, and has been following up with Hematology once a year.   The pt notes that he is "78 years old, can do a lot of things," and remains fairly active and works out. He denies any other significant medical concerns and denies liver or heart problems. He notes that he has some degenerative discs which give him only some discomfort when he bends over.  Most recent lab results (04/16/18) of CBC w/diff and CMP is as follows: all values are WNL except for MCH at 33.3, Glucose at 175, Creatinine at 1.28, GFR at 53, Chloride at 109. 04/16/18 Ferritin at 49  On review of systems, pt reports stable energy levels, staying active, stable weight, eating well, and denies concerns for infections, and any other symptoms.  On PMHx the pt reports Homozygous Hereditary Hemochromatosis, denies liver or  heart problems. Erectile dysfunction. On Family Hx the pt reports brother with homozygous hereditary hemochromatosis. Daughter with carrier state of C282Y mutation.   MEDICAL HISTORY:  Past Medical History:  Diagnosis Date  . Hemochromatosis   . HTN (hypertension), benign 02/06/2014   pt takes inderal for pre competition calmness  . 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 Other and unspecified hyperlipidemia 02/06/2014    SURGICAL HISTORY: Past Surgical History:  Procedure Laterality Date  . APPENDECTOMY    . BILATERAL CARPAL TUNNEL RELEASE Bilateral   . EYE SURGERY     cataract  . HERNIA REPAIR     abdomen  . JOINT REPLACEMENT     lt TKA  . KNEE ARTHROSCOPY Right   . MASS EXCISION Left 09/12/2014   Procedure: LEFT ELBOW AND FOREARM MASS REMOVAL;  Surgeon: Roseanne Kaufman, MD;  Location: [facility];  Service: Orthopedics;  Laterality: Left;  . TONSILLECTOMY    . WRIST SURGERY Left     SOCIAL HISTORY: Social History   Socioeconomic History  . Marital status: Married    Spouse name: Not on file  . Number of children: Not on file  . Years of education: Not on file  . Highest education level: Not on file  Occupational History  . Not on file  Social Needs  . Financial resource strain: Not on file  . Food insecurity:    Worry: Not on file    Inability: Not on file  . Transportation needs:    Medical:  Not on file    Non-medical: Not on file  Tobacco Use  . Smoking status: Former Smoker    Last attempt to quit: 02/07/1960    Years since quitting: 58.9  . Smokeless tobacco: Former Network engineer and Sexual Activity  . Alcohol use: Yes    Alcohol/week: 0.0 standard drinks    Comment: socially.  . Drug use: No  . Sexual activity: Not on file  Lifestyle  . Physical activity:    Days per week: Not on file    Minutes per session: Not on file  . Stress: Not on file  Relationships  . Social connections:    Talks on phone: Not on file    Gets together: Not  on file    Attends religious service: Not on file    Active member of club or organization: Not on file    Attends meetings of clubs or organizations: Not on file    Relationship status: Not on file  . Intimate partner violence:    Fear of current or ex partner: Not on file    Emotionally abused: Not on file    Physically abused: Not on file    Forced sexual activity: Not on file  Other Topics Concern  . Not on file  Social History Narrative  . Not on file    FAMILY HISTORY: No family history on file.  ALLERGIES:  is allergic to chocolate; cucumber extract; onion; and penicillins.  MEDICATIONS:  Current Outpatient Medications  Medication Sig Dispense Refill  . Acetaminophen (TYLENOL PO) Take 2 tablets by mouth every 6 (six) hours as needed (pain).    Marland Kitchen diphenhydrAMINE (BENADRYL) 12.5 MG/5ML elixir Take 5 mg by mouth once as needed (possible allergic reaction).    . folic acid (FOLVITE) 1 MG tablet TAKE 1 TABLET BY MOUTH ONCE DAILY 30 tablet 16  . pravastatin (PRAVACHOL) 40 MG tablet Take 40 mg by mouth daily.      . propranolol (INDERAL) 40 MG tablet Take 40 mg by mouth See admin instructions. 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 [place] JOHNS WORT PO Take 2 capsules by mouth daily.    Marland Kitchen thiamine (VITAMIN B-1) 50 MG tablet Take 50 mg by mouth daily.      Marland Kitchen triamcinolone cream (KENALOG) 0.5 % Apply 1 application topically 3 (three) times daily as needed (itching/ insect bites).     No current facility-administered medications for this visit.     REVIEW OF SYSTEMS:    10 Point review of Systems was done is negative except as noted above.  PHYSICAL EXAMINATION:  . Vitals:   01/03/19 0946  BP: (!) 148/94  Pulse: 89  Resp: 18  Temp: 98 F (36.7 C)  SpO2: 97%   Filed Weights   01/03/19 0946  Weight: 193 lb 1.6 oz (87.6 kg)   .Body mass index is 29.36 kg/m.  GENERAL:alert, in no acute distress and  comfortable SKIN: no acute rashes, no significant lesions EYES: conjunctiva are pink and non-injected, sclera anicteric OROPHARYNX: MMM, no exudates, no oropharyngeal erythema or ulceration NECK: supple, no JVD LYMPH:  no palpable lymphadenopathy in the cervical, axillary or inguinal regions LUNGS: clear to auscultation b/l with normal respiratory effort HEART: regular rate & rhythm ABDOMEN:  normoactive bowel sounds , non tender, not distended. Extremity: no pedal edema. Palpable abdominal lipoma. PSYCH: alert & oriented x 3 with fluent speech  NEURO: no focal motor/sensory deficits  LABORATORY DATA:  I have reviewed the data as listed  . CBC Latest Ref Rng & Units 04/16/2018 12/11/2017 04/14/2017  WBC 3.4 - 10.8 x10E3/uL 5.2 6.6 6.2  Hemoglobin 13.0 - 17.7 g/dL 14.9 15.4 14.2  Hematocrit 37.5 - 51.0 % 43.3 45.6 41.8  Platelets 150 - 450 x10E3/uL 237 256 303    . CMP Latest Ref Rng & Units 04/16/2018 12/11/2017 04/14/2017  Glucose 65 - 99 mg/dL 175(H) 165(H) 123(H)  BUN 8 - 27 mg/dL 20 24 17   Creatinine 0.76 - 1.27 mg/dL 1.28(H) 1.32(H) 1.29(H)  Sodium 134 - 144 mmol/L 143 141 145(H)  Potassium 3.5 - 5.2 mmol/L 4.4 4.4 4.6  Chloride 96 - 106 mmol/L 109(H) 106 107(H)  CO2 20 - 29 mmol/L 20 20 25   Calcium 8.6 - 10.2 mg/dL 8.9 9.2 9.0  Total Protein 6.0 - 8.5 g/dL 6.5 6.6 6.5  Total Bilirubin 0.0 - 1.2 mg/dL 0.7 0.9 0.8  Alkaline Phos 39 - 117 IU/L 62 71 67  AST 0 - 40 IU/L 20 28 18   ALT 0 - 44 IU/L 18 29 20       RADIOGRAPHIC STUDIES: I have personally reviewed the radiological images as listed and agreed with the findings in the report. No results found.  ASSESSMENT & PLAN:  78 y.o. male with  1. 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  Pt gave blood twice a year prior to his diagnosis, likely explaining why his Ferritin was not much higher upon initial diagnosis at age 14 with a  homozygous C282Y mutation. However, molecular testing records are not available and pt could have only the carrier state, as he reports a son without a copy of the C282Y mutation.  PLAN: -Discussed patient's most recent labs from 04/16/18, blood counts normal including HGB at 14.9, HCT at 43.3. Ferritin at 49. -Continue with phlebotomies every other month, with [facility] as [facility] is currently doing, which has kept the pt's Ferritin stable for quite some time -Will order labs today -Will send molecular testing to conclusively evaluate carrier state vs homozygous disease state -Will see the pt back in 12 months   Labs today RTC with Dr Irene Limbo with labs in 1 yr Continue therapeutic phlebotomy as per [facility] q54[facility]    All of the patients questions were answered with apparent satisfaction. The patient knows to call the clinic with any problems, questions or concerns.  The total time spent in the appt was 35 minutes and more than 50% was on counseling and direct patient cares.    Sullivan Lone MD MS AAHIVMS [facility] Hematology/Oncology Physician [facility]  (Office):       [phone] (Work cell):  [phone] (Fax):           [phone]  01/03/2019 10:35 AM  I, Baldwin [place], am acting as a scribe for Dr. Sullivan Lone.   .I have reviewed the above documentation for accuracy and completeness, and I agree with the above. Brunetta Genera MD

## 2019-01-03 ENCOUNTER — Inpatient Hospital Stay: Payer: Medicare Other | Attending: Hematology | Admitting: Hematology

## 2019-01-03 ENCOUNTER — Other Ambulatory Visit: Payer: Self-pay

## 2019-01-03 ENCOUNTER — Telehealth: Payer: Self-pay | Admitting: Hematology

## 2019-01-03 ENCOUNTER — Inpatient Hospital Stay: Payer: Medicare Other

## 2019-01-03 DIAGNOSIS — I10 Essential (primary) hypertension: Secondary | ICD-10-CM | POA: Insufficient documentation

## 2019-01-03 DIAGNOSIS — Z87891 Personal history of nicotine dependence: Secondary | ICD-10-CM | POA: Diagnosis not present

## 2019-01-03 DIAGNOSIS — Z79899 Other long term (current) drug therapy: Secondary | ICD-10-CM | POA: Insufficient documentation

## 2019-01-03 LAB — CMP (CANCER CENTER ONLY)
ALT: 23 U/L (ref 0–44)
AST: 24 U/L (ref 15–41)
Albumin: 3.8 g/dL (ref 3.5–5.0)
Alkaline Phosphatase: 69 U/L (ref 38–126)
Anion gap: 10 (ref 5–15)
BUN: 21 mg/dL (ref 8–23)
CO2: 23 mmol/L (ref 22–32)
Calcium: 9.3 mg/dL (ref 8.9–10.3)
Chloride: 108 mmol/L (ref 98–111)
Creatinine: 1.38 mg/dL — ABNORMAL HIGH (ref 0.61–1.24)
GFR, Est AFR Am: 56 mL/min — ABNORMAL LOW (ref >60)
GFR, Estimated: 48 mL/min — ABNORMAL LOW (ref >60)
Glucose, Bld: 170 mg/dL — ABNORMAL HIGH (ref 70–99)
Potassium: 4.4 mmol/L (ref 3.5–5.1)
Sodium: 141 mmol/L (ref 135–145)
Total Bilirubin: 0.8 mg/dL (ref 0.3–1.2)
Total Protein: 7.5 g/dL (ref 6.5–8.1)

## 2019-01-03 LAB — CBC WITH DIFFERENTIAL/PLATELET
Abs Immature Granulocytes: 0.01 10*3/uL (ref 0.00–0.07)
Basophils Absolute: 0.1 10*3/uL (ref 0.0–0.1)
Basophils Relative: 1 %
Eosinophils Absolute: 0.2 10*3/uL (ref 0.0–0.5)
Eosinophils Relative: 3 %
HCT: 42.5 % (ref 39.0–52.0)
Hemoglobin: 14.9 g/dL (ref 13.0–17.0)
Immature Granulocytes: 0 %
Lymphocytes Relative: 36 %
Lymphs Abs: 2.6 10*3/uL (ref 0.7–4.0)
MCH: 33.2 pg (ref 26.0–34.0)
MCHC: 35.1 g/dL (ref 30.0–36.0)
MCV: 94.7 fL (ref 80.0–100.0)
Monocytes Absolute: 0.6 10*3/uL (ref 0.1–1.0)
Monocytes Relative: 9 %
Neutro Abs: 3.8 10*3/uL (ref 1.7–7.7)
Neutrophils Relative %: 51 %
Platelets: 228 10*3/uL (ref 150–400)
RBC: 4.49 MIL/uL (ref 4.22–5.81)
RDW: 12.3 % (ref 11.5–15.5)
WBC: 7.2 10*3/uL (ref 4.0–10.5)
nRBC: 0 % (ref 0.0–0.2)

## 2019-01-03 LAB — IRON AND TIBC
Iron: 107 ug/dL (ref 42–163)
Saturation Ratios: 43 % (ref 20–55)
TIBC: 251 ug/dL (ref 202–409)
UIBC: 144 ug/dL (ref 117–376)

## 2019-01-03 LAB — FERRITIN: Ferritin: 26 ng/mL (ref 24–336)

## 2019-01-03 NOTE — Telephone Encounter (Signed)
Scheduled appt per 4/30 los. ° °A calendar will be mailed out. °

## 2019-01-08 LAB — HEMOCHROMATOSIS DNA-PCR(C282Y,H63D)

## 2019-01-10 ENCOUNTER — Telehealth: Payer: Self-pay | Admitting: *Deleted

## 2019-01-10 NOTE — Telephone Encounter (Signed)
Called and requested information on lab tests completed 4/30. In general, is everything ok? Specific Questions: 1) Currently donates blood every 2 months - should that be extended based on iron levels? 2) What did the genetic testing show r/t hemachromatosis? Informed patient that message/questions will be sent to Dr. Irene Limbo. Patient verbalized understanding.

## 2019-01-25 NOTE — Telephone Encounter (Signed)
Genetic testing confirmed known homozygous C282Y mutation. Nothing new on that front. His ferritin levels are 26. He could back off to every 3 months for his blood donations. Thanks [place]

## 2019-01-28 ENCOUNTER — Other Ambulatory Visit: Payer: Self-pay | Admitting: Oncology

## 2019-02-01 ENCOUNTER — Telehealth: Payer: Self-pay | Admitting: *Deleted

## 2019-02-01 NOTE — Telephone Encounter (Signed)
Contacted patient with information from Dr. Irene Limbo: Genetic testing confirmed current confirmed mutation  and that he can donate blood every 3 months now instead of every 2, due to change in ferritin level to 26. Patient verbalized understanding.

## 2019-02-02 ENCOUNTER — Other Ambulatory Visit: Payer: Self-pay

## 2019-02-02 ENCOUNTER — Emergency Department (HOSPITAL_COMMUNITY)
Admission: EM | Admit: 2019-02-02 | Discharge: 2019-02-02 | Disposition: A | Discharge status: Home / Self Care | Payer: Medicare Other | Attending: Emergency Medicine | Admitting: Emergency Medicine

## 2019-02-02 ENCOUNTER — Encounter (HOSPITAL_COMMUNITY): Payer: Self-pay

## 2019-02-02 DIAGNOSIS — Y93H2 Activity, gardening and landscaping: Secondary | ICD-10-CM | POA: Insufficient documentation

## 2019-02-02 DIAGNOSIS — S50862A Insect bite (nonvenomous) of left forearm, initial encounter: Secondary | ICD-10-CM | POA: Insufficient documentation

## 2019-02-02 DIAGNOSIS — Z96652 Presence of left artificial knee joint: Secondary | ICD-10-CM | POA: Insufficient documentation

## 2019-02-02 DIAGNOSIS — T63441A Toxic effect of venom of bees, accidental (unintentional), initial encounter: Secondary | ICD-10-CM

## 2019-02-02 DIAGNOSIS — W57XXXA Bitten or stung by nonvenomous insect and other nonvenomous arthropods, initial encounter: Secondary | ICD-10-CM | POA: Insufficient documentation

## 2019-02-02 DIAGNOSIS — Z79899 Other long term (current) drug therapy: Secondary | ICD-10-CM | POA: Diagnosis not present

## 2019-02-02 DIAGNOSIS — Z87891 Personal history of nicotine dependence: Secondary | ICD-10-CM | POA: Insufficient documentation

## 2019-02-02 DIAGNOSIS — R55 Syncope and collapse: Secondary | ICD-10-CM

## 2019-02-02 DIAGNOSIS — Y92007 Garden or yard of unspecified non-institutional (private) residence as the place of occurrence of the external cause: Secondary | ICD-10-CM | POA: Diagnosis not present

## 2019-02-02 DIAGNOSIS — Y999 Unspecified external cause status: Secondary | ICD-10-CM | POA: Insufficient documentation

## 2019-02-02 DIAGNOSIS — I10 Essential (primary) hypertension: Secondary | ICD-10-CM | POA: Diagnosis not present

## 2019-02-02 DIAGNOSIS — T782XXA Anaphylactic shock, unspecified, initial encounter: Secondary | ICD-10-CM | POA: Diagnosis not present

## 2019-02-02 DIAGNOSIS — Z880 Allergy status to penicillin: Secondary | ICD-10-CM | POA: Diagnosis not present

## 2019-02-02 HISTORY — DX: Toxic effect of venom of bees, accidental (unintentional), initial encounter: T63.441A

## 2019-02-02 LAB — CBC WITH DIFFERENTIAL/PLATELET
Abs Immature Granulocytes: 0.06 10*3/uL (ref 0.00–0.07)
Basophils Absolute: 0.1 10*3/uL (ref 0.0–0.1)
Basophils Relative: 0 %
Eosinophils Absolute: 0 10*3/uL (ref 0.0–0.5)
Eosinophils Relative: 0 %
HCT: 44.1 % (ref 39.0–52.0)
Hemoglobin: 15 g/dL (ref 13.0–17.0)
Immature Granulocytes: 0 %
Lymphocytes Relative: 10 %
Lymphs Abs: 1.4 10*3/uL (ref 0.7–4.0)
MCH: 32.6 pg (ref 26.0–34.0)
MCHC: 34 g/dL (ref 30.0–36.0)
MCV: 95.9 fL (ref 80.0–100.0)
Monocytes Absolute: 1.2 10*3/uL — ABNORMAL HIGH (ref 0.1–1.0)
Monocytes Relative: 8 %
Neutro Abs: 11.6 10*3/uL — ABNORMAL HIGH (ref 1.7–7.7)
Neutrophils Relative %: 82 %
Platelets: 276 10*3/uL (ref 150–400)
RBC: 4.6 MIL/uL (ref 4.22–5.81)
RDW: 12.3 % (ref 11.5–15.5)
WBC: 14.4 10*3/uL — ABNORMAL HIGH (ref 4.0–10.5)
nRBC: 0 % (ref 0.0–0.2)

## 2019-02-02 LAB — BASIC METABOLIC PANEL
Anion gap: 7 (ref 5–15)
BUN: 22 mg/dL (ref 8–23)
CO2: 25 mmol/L (ref 22–32)
Calcium: 8.9 mg/dL (ref 8.9–10.3)
Chloride: 108 mmol/L (ref 98–111)
Creatinine, Ser: 1.6 mg/dL — ABNORMAL HIGH (ref 0.61–1.24)
GFR calc Af Amer: 47 mL/min — ABNORMAL LOW (ref >60)
GFR calc non Af Amer: 40 mL/min — ABNORMAL LOW (ref >60)
Glucose, Bld: 204 mg/dL — ABNORMAL HIGH (ref 70–99)
Potassium: 4.2 mmol/L (ref 3.5–5.1)
Sodium: 140 mmol/L (ref 135–145)

## 2019-02-02 MED ORDER — EPINEPHRINE 0.3 MG/0.3ML IJ SOAJ: 0.3000 mg | INTRAMUSCULAR | 0 refills | Status: AC | PRN

## 2019-02-02 MED ORDER — LORAZEPAM 2 MG/ML IJ SOLN
0.5000 mg | Freq: Once | INTRAMUSCULAR | Status: AC
  Administered 2019-02-02: 15:00:00 0.5 mg via INTRAVENOUS
  Filled 2019-02-02: qty 1

## 2019-02-02 NOTE — ED Provider Notes (Signed)
Medical screening examination/treatment/procedure(s) were conducted as a shared visit with non-physician practitioner(s) and myself.  I personally evaluated the patient during the encounter.  EKG Interpretation  Date/Time:  Saturday Feb 02 2019 14:02:55 EDT Ventricular Rate:  103 PR Interval:    QRS Duration: 93 QT Interval:  367 QTC Calculation: 481 R Axis:   36 Text Interpretation:  Sinus tachycardia Borderline prolonged QT interval No significant change since last tracing Confirmed by Lacretia Leigh ([id number]) on 02/02/2019 3:50:54 PM   Patient here after having anaphylactic reaction to insect sting.  Did have a brief syncopal event which sounds like a vagal episode.  Will check labs here.  EKG is without acute findings.  Will likely discharge home   Lacretia Leigh, MD 02/02/19 1551

## 2019-02-02 NOTE — ED Notes (Signed)
ED Provider at bedside. 

## 2019-02-02 NOTE — ED Triage Notes (Signed)
Patient brought in via [facility] from home. Patient is AOx4 and ambulatory at baseline however not currently due to BP. Patient was mowing yard, began to feel weak and like patient was going to pass out. Patient called 911 and took epi pen. EMS arrived and BP was initially 70/40 sitting, patient has remained supine and received bolus of Normal saline, 581mL.   50mg  Benadryl IV given

## 2019-02-02 NOTE — ED Notes (Signed)
Bed: WA17 Expected date:  Expected time:  Means of arrival:  Comments: Allergic reaction (Bee Sting?)

## 2019-02-02 NOTE — ED Provider Notes (Signed)
[facility] DEPT Provider Note   CSN: [id number] Arrival date & time: 02/02/19  1349     History   Chief Complaint Chief Complaint  Patient presents with  . Allergic Reaction  . Insect Bite    HPI Jeffery Carpenter is a 78 y.o. male who presents with syncope, hypotension. PMH significant for anaphylaxis, hemochromatosis, HTN, left knee arthritis.  Patient reports mowing his lawn today and was bit by a fly and soon after he started to feel lightheaded he was going to pass out and his vision was getting blurry.  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  He went into the house and instructed his wife to give him an EpiPen injection which she did.  He subsequently passed out.  EMS was called and blood pressure was noted to be 70/40 sitting. They gave him 500 cc fluid bolus and 50 mg of Benadryl.  Currently he feels anxious and tremulous but does not feel lightheaded, short of breath.  He does not have hives just a bite on his left forearm.  He denies abdominal pain, nausea or vomiting.  He reports a slight headache.  No chest pain.    HPI  Past Medical History:  Diagnosis Date  . 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 Hemochromatosis   . HTN (hypertension), benign 02/06/2014   pt takes inderal for pre competition calmness  . 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 Other and unspecified hyperlipidemia 02/06/2014    Patient Active Problem List   Diagnosis Date Noted  . 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 Other and unspecified hyperlipidemia 02/06/2014  . HTN (hypertension), benign 02/06/2014  . Hemochromatosis 05/08/2012    Past Surgical History:  Procedure Laterality Date  . APPENDECTOMY    . BILATERAL CARPAL TUNNEL RELEASE Bilateral   . EYE SURGERY     cataract  . HERNIA REPAIR     abdomen  . JOINT REPLACEMENT     lt TKA  . KNEE ARTHROSCOPY Right   . MASS EXCISION Left 09/12/2014   Procedure: LEFT ELBOW AND  FOREARM MASS REMOVAL;  Surgeon: Roseanne Kaufman, MD;  Location: [facility];  Service: Orthopedics;  Laterality: Left;  . TONSILLECTOMY    . WRIST SURGERY Left         Home Medications    Prior to Admission medications   Medication Sig Start Date End Date Taking? Authorizing Provider  Acetaminophen (TYLENOL PO) Take 2 tablets by mouth every 6 (six) hours as needed (pain).    [provider]  diphenhydrAMINE (BENADRYL) 12.5 MG/5ML elixir Take 5 mg by mouth once as needed (possible allergic reaction).    [provider]  folic acid (FOLVITE) 1 MG tablet TAKE 1 TABLET BY MOUTH ONCE DAILY 01/01/18   Annia Belt, MD  pravastatin (PRAVACHOL) 40 MG tablet Take 40 mg by mouth daily.      [provider]  propranolol (INDERAL) 40 MG tablet Take 40 mg by mouth See admin instructions. 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    [provider]  thiamine (VITAMIN B-1) 50 MG tablet Take 50 mg by mouth daily.      [provider]  triamcinolone cream (KENALOG) 0.5 % Apply  1 application topically 3 (three) times daily as needed (itching/ insect bites).    [provider]    Family History History reviewed. No pertinent family history.  Social History Social History   Tobacco Use  . Smoking status: Former Smoker    Last attempt to quit: 02/07/1960    Years since quitting: 59.0  . Smokeless tobacco: Former Network engineer Use Topics  . Alcohol use: Yes    Alcohol/week: 0.0 standard drinks    Comment: socially.  . Drug use: No     Allergies   Bee venom; Chocolate; Cucumber extract; Onion; and Penicillins   Review of Systems Review of Systems  Constitutional: Negative for fever.  Respiratory: Negative for cough, shortness of breath and wheezing.   Cardiovascular: Negative for  chest pain.  Gastrointestinal: Negative for abdominal pain, nausea and vomiting.  Skin: Positive for rash and wound.  Neurological: Positive for tremors, syncope, light-headedness and headaches.  Psychiatric/Behavioral: The patient is nervous/anxious.   All other systems reviewed and are negative.    Physical Exam Updated Vital Signs BP (!) 153/109 (BP Location: Right Arm) Comment: shaking  Pulse (!) 102   Temp 97.6 F (36.4 C) (Oral)   Resp 18   Ht (S) 5' 7.5" (1.715 m)   Wt (S) 84.4 kg   SpO2 97%   BMI 28.70 kg/m   Physical Exam Vitals signs and nursing note reviewed.  Constitutional:      General: He is not in acute distress.    Appearance: Normal appearance. He is well-developed. He is not ill-appearing.  HENT:     Head: Normocephalic and atraumatic.  Eyes:     General: No scleral icterus.       Right eye: No discharge.        Left eye: No discharge.     Conjunctiva/sclera: Conjunctivae normal.     Pupils: Pupils are equal, round, and reactive to light.  Neck:     Musculoskeletal: Normal range of motion.  Cardiovascular:     Rate and Rhythm: Normal rate and regular rhythm.  Pulmonary:     Effort: Pulmonary effort is normal. No respiratory distress.     Breath sounds: Normal breath sounds.  Abdominal:     General: There is no distension.     Palpations: Abdomen is soft.     Tenderness: There is no abdominal tenderness.  Skin:    General: Skin is warm and dry.     Comments: Small penny sized area of erythema over left forearm   Neurological:     Mental Status: He is alert and oriented to person, place, and time.  Psychiatric:        Mood and Affect: Mood is anxious (slightly tremulous).        Behavior: Behavior normal.      ED Treatments / Results  Labs (all labs ordered are listed, but only abnormal results are displayed) Labs Reviewed  BASIC METABOLIC PANEL - Abnormal; Notable for the following components:      Result Value   Glucose, Bld 204 (*)     Creatinine, Ser 1.60 (*)    GFR calc non Af Amer 40 (*)    GFR calc Af Amer 47 (*)    All other components within normal limits  CBC WITH DIFFERENTIAL/PLATELET - Abnormal; Notable for the following components:   WBC 14.4 (*)    Neutro Abs 11.6 (*)    Monocytes Absolute 1.2 (*)    All other components within  normal limits    EKG EKG Interpretation  Date/Time:  Saturday Feb 02 2019 14:02:55 EDT Ventricular Rate:  103 PR Interval:    QRS Duration: 93 QT Interval:  367 QTC Calculation: 481 R Axis:   36 Text Interpretation:  Sinus tachycardia Borderline prolonged QT interval No significant change since last tracing Confirmed by Lacretia Leigh ([id number]) on 02/02/2019 3:50:54 PM   Radiology No results found.  Procedures Procedures (including critical care time)  Medications Ordered in ED Medications  LORazepam (ATIVAN) injection 0.5 mg (0.5 mg Intravenous Given 02/02/19 1508)     Initial Impression / Assessment and Plan / ED Course  I have reviewed the triage vital signs and the nursing notes.  Pertinent labs & imaging results that were available during my care of the patient were reviewed by me and considered in my medical decision making (see chart for details).  78 year old male presents with episode of hypotension, syncope in the setting of being bit by an insect.  Symptoms are consistent with possible anaphylaxis which he does have a history of.  He was hypotensive with EMS however here he is hypertensive and tachycardic.  Exam is overall unremarkable other than mild anxiousness.  Lungs are clear to auscultation.  Abdomen is soft and nontender.  He has a small bite wound on the left forearm without evidence of hives.  Will obtain EKG, blood work and give 0.5 of Ativan.  EKG is sinus tachycardia with prolonged QT.  CBC shows leukocytosis (14.4) which is likely reactive.  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  Shared visit with Dr.  Zenia Resides.  Will obtain orthostatics.  BP dropped with orthostatics but he is asymptomatic. He feels better. Will refill epipen and have him f/u with his doctor for BP recheck.  Final Clinical Impressions(s) / ED Diagnoses   Final diagnoses:  Insect bite of left forearm, initial encounter  Anaphylaxis, initial encounter  Syncope, unspecified syncope type    ED Discharge Orders    None       Recardo Evangelist, PA-C 02/02/19 Lennon Alstrom, MD 02/03/19 [phone]

## 2019-02-02 NOTE — Discharge Instructions (Addendum)
Please see your doctor to have your blood pressure rechecked Drink plenty of water Please return if worsening

## 2019-02-05 ENCOUNTER — Telehealth: Payer: Self-pay | Admitting: *Deleted

## 2019-02-05 NOTE — Telephone Encounter (Signed)
Called - LVM asking if Dr. Irene Limbo was going to refill his folic acid/folvite that had been prescribed by Dr. Beryle Beams. Message sent to Dr.Kale.

## 2019-02-06 ENCOUNTER — Telehealth: Payer: Self-pay | Admitting: *Deleted

## 2019-02-06 NOTE — Telephone Encounter (Signed)
Patient asked to have Folvite refilled if he should continue it. Continue per Dr.Kale and Refill authorized. Patient may buy OTC if preferred. Patient contacted and given information.  He verbalized understanding. He will check price to purchase OTC- if prescribed is less expensive, he will contact office so that prescription can be sent.

## 2019-02-07 ENCOUNTER — Telehealth: Payer: Self-pay | Admitting: *Deleted

## 2019-02-07 MED ORDER — FOLIC ACID 1 MG PO TABS: 1.0000 mg | ORAL_TABLET | Freq: Every day | ORAL | 12 refills | Status: DC

## 2019-02-07 NOTE — Telephone Encounter (Signed)
Unable to locate Folic Acid 1 mg dose at pharmacy. Requests that prescription be sent in as authorized by Dr. Irene Limbo. Prescription sent to pharmacy on record.

## 2019-03-22 ENCOUNTER — Encounter: Payer: Self-pay | Admitting: Neurology

## 2019-03-23 ENCOUNTER — Emergency Department (HOSPITAL_COMMUNITY): Payer: Medicare Other

## 2019-03-23 ENCOUNTER — Other Ambulatory Visit: Payer: Self-pay

## 2019-03-23 ENCOUNTER — Observation Stay (HOSPITAL_BASED_OUTPATIENT_CLINIC_OR_DEPARTMENT_OTHER): Payer: Medicare Other

## 2019-03-23 ENCOUNTER — Encounter (HOSPITAL_COMMUNITY): Payer: Self-pay

## 2019-03-23 ENCOUNTER — Inpatient Hospital Stay (HOSPITAL_COMMUNITY)
Admission: EM | Admit: 2019-03-23 | Discharge: 2019-03-26 | DRG: 246 | Disposition: A | Discharge status: Home / Self Care | Payer: Medicare Other | Attending: Student | Admitting: Student

## 2019-03-23 DIAGNOSIS — R946 Abnormal results of thyroid function studies: Secondary | ICD-10-CM | POA: Diagnosis present

## 2019-03-23 DIAGNOSIS — R079 Chest pain, unspecified: Secondary | ICD-10-CM

## 2019-03-23 DIAGNOSIS — I129 Hypertensive chronic kidney disease with stage 1 through stage 4 chronic kidney disease, or unspecified chronic kidney disease: Secondary | ICD-10-CM | POA: Diagnosis present

## 2019-03-23 DIAGNOSIS — I214 Non-ST elevation (NSTEMI) myocardial infarction: Secondary | ICD-10-CM | POA: Diagnosis not present

## 2019-03-23 DIAGNOSIS — R0782 Intercostal pain: Secondary | ICD-10-CM

## 2019-03-23 DIAGNOSIS — E1122 Type 2 diabetes mellitus with diabetic chronic kidney disease: Secondary | ICD-10-CM | POA: Diagnosis present

## 2019-03-23 DIAGNOSIS — Z79899 Other long term (current) drug therapy: Secondary | ICD-10-CM

## 2019-03-23 DIAGNOSIS — F419 Anxiety disorder, unspecified: Secondary | ICD-10-CM | POA: Diagnosis present

## 2019-03-23 DIAGNOSIS — E785 Hyperlipidemia, unspecified: Secondary | ICD-10-CM

## 2019-03-23 DIAGNOSIS — E1169 Type 2 diabetes mellitus with other specified complication: Secondary | ICD-10-CM | POA: Diagnosis present

## 2019-03-23 DIAGNOSIS — Z880 Allergy status to penicillin: Secondary | ICD-10-CM

## 2019-03-23 DIAGNOSIS — N182 Chronic kidney disease, stage 2 (mild): Secondary | ICD-10-CM | POA: Diagnosis present

## 2019-03-23 DIAGNOSIS — Z87891 Personal history of nicotine dependence: Secondary | ICD-10-CM

## 2019-03-23 DIAGNOSIS — Z955 Presence of coronary angioplasty implant and graft: Secondary | ICD-10-CM

## 2019-03-23 DIAGNOSIS — I200 Unstable angina: Secondary | ICD-10-CM

## 2019-03-23 DIAGNOSIS — Z8249 Family history of ischemic heart disease and other diseases of the circulatory system: Secondary | ICD-10-CM

## 2019-03-23 DIAGNOSIS — Z20828 Contact with and (suspected) exposure to other viral communicable diseases: Secondary | ICD-10-CM | POA: Diagnosis present

## 2019-03-23 DIAGNOSIS — I2542 Coronary artery dissection: Secondary | ICD-10-CM | POA: Diagnosis present

## 2019-03-23 LAB — BASIC METABOLIC PANEL
Anion gap: 9 (ref 5–15)
BUN: 25 mg/dL — ABNORMAL HIGH (ref 8–23)
CO2: 21 mmol/L — ABNORMAL LOW (ref 22–32)
Calcium: 8.9 mg/dL (ref 8.9–10.3)
Chloride: 109 mmol/L (ref 98–111)
Creatinine, Ser: 1.35 mg/dL — ABNORMAL HIGH (ref 0.61–1.24)
GFR calc Af Amer: 57 mL/min — ABNORMAL LOW (ref >60)
GFR calc non Af Amer: 50 mL/min — ABNORMAL LOW (ref >60)
Glucose, Bld: 177 mg/dL — ABNORMAL HIGH (ref 70–99)
Potassium: 4.1 mmol/L (ref 3.5–5.1)
Sodium: 139 mmol/L (ref 135–145)

## 2019-03-23 LAB — CBC
HCT: 42.4 % (ref 39.0–52.0)
HCT: 41.7 % (ref 39.0–52.0)
Hemoglobin: 14.1 g/dL (ref 13.0–17.0)
Hemoglobin: 14.3 g/dL (ref 13.0–17.0)
MCH: 32.2 pg (ref 26.0–34.0)
MCH: 33.2 pg (ref 26.0–34.0)
MCHC: 33.3 g/dL (ref 30.0–36.0)
MCHC: 34.3 g/dL (ref 30.0–36.0)
MCV: 96.8 fL (ref 80.0–100.0)
MCV: 96.8 fL (ref 80.0–100.0)
Platelets: 277 10*3/uL (ref 150–400)
Platelets: 262 10*3/uL (ref 150–400)
RBC: 4.38 MIL/uL (ref 4.22–5.81)
RBC: 4.31 MIL/uL (ref 4.22–5.81)
RDW: 12.3 % (ref 11.5–15.5)
RDW: 12.3 % (ref 11.5–15.5)
WBC: 7.9 10*3/uL (ref 4.0–10.5)
WBC: 7.5 10*3/uL (ref 4.0–10.5)
nRBC: 0 % (ref 0.0–0.2)
nRBC: 0 % (ref 0.0–0.2)

## 2019-03-23 LAB — ECHOCARDIOGRAM COMPLETE
Height: 67.5 in
Weight: 2977.09 oz

## 2019-03-23 LAB — PROTIME-INR
INR: 1.1 (ref 0.8–1.2)
Prothrombin Time: 13.9 seconds (ref 11.4–15.2)

## 2019-03-23 LAB — TROPONIN I (HIGH SENSITIVITY)
Troponin I (High Sensitivity): 23 ng/L — ABNORMAL HIGH (ref <18)
Troponin I (High Sensitivity): 72 ng/L — ABNORMAL HIGH (ref <18)
Troponin I (High Sensitivity): 121 ng/L (ref <18)
Troponin I (High Sensitivity): 101 ng/L (ref <18)

## 2019-03-23 LAB — HEPARIN LEVEL (UNFRACTIONATED): Heparin Unfractionated: 0.75 IU/mL — ABNORMAL HIGH (ref 0.30–0.70)

## 2019-03-23 LAB — HEMOGLOBIN A1C
Hgb A1c MFr Bld: 6.6 % — ABNORMAL HIGH (ref 4.8–5.6)
Mean Plasma Glucose: 142.72 mg/dL

## 2019-03-23 LAB — APTT: aPTT: 30 seconds (ref 24–36)

## 2019-03-23 LAB — CREATININE, SERUM
Creatinine, Ser: 1.28 mg/dL — ABNORMAL HIGH (ref 0.61–1.24)
GFR calc Af Amer: >60 mL/min (ref >60)
GFR calc non Af Amer: 53 mL/min — ABNORMAL LOW (ref >60)

## 2019-03-23 LAB — MAGNESIUM: Magnesium: 2.3 mg/dL (ref 1.7–2.4)

## 2019-03-23 LAB — TSH: TSH: 5.279 u[IU]/mL — ABNORMAL HIGH (ref 0.350–4.500)

## 2019-03-23 LAB — SARS CORONAVIRUS 2 BY RT PCR (HOSPITAL ORDER, PERFORMED IN ~~LOC~~ HOSPITAL LAB): SARS Coronavirus 2: NEGATIVE

## 2019-03-23 MED ORDER — PROPRANOLOL HCL 40 MG PO TABS: 40.0000 mg | ORAL_TABLET | ORAL | Status: DC

## 2019-03-23 MED ORDER — ONDANSETRON HCL 4 MG/2ML IJ SOLN: 4.0000 mg | Freq: Four times a day (QID) | INTRAMUSCULAR | Status: DC | PRN

## 2019-03-23 MED ORDER — ASPIRIN EC 81 MG PO TBEC
81.0000 mg | DELAYED_RELEASE_TABLET | Freq: Every day | ORAL | Status: DC
  Administered 2019-03-24: 81 mg via ORAL
  Filled 2019-03-23: qty 1

## 2019-03-23 MED ORDER — METOPROLOL TARTRATE 25 MG PO TABS
25.0000 mg | ORAL_TABLET | Freq: Two times a day (BID) | ORAL | Status: DC
  Administered 2019-03-23 – 2019-03-26 (×7): 25 mg via ORAL
  Filled 2019-03-23 (×7): qty 1

## 2019-03-23 MED ORDER — SODIUM CHLORIDE 0.9% FLUSH
3.0000 mL | Freq: Once | INTRAVENOUS | Status: AC
  Administered 2019-03-23: 06:00:00 3 mL via INTRAVENOUS

## 2019-03-23 MED ORDER — CLOPIDOGREL BISULFATE 75 MG PO TABS
300.0000 mg | ORAL_TABLET | Freq: Once | ORAL | Status: AC
  Administered 2019-03-23: 300 mg via ORAL
  Filled 2019-03-23: qty 4

## 2019-03-23 MED ORDER — NITROGLYCERIN 2 % TD OINT
1.0000 [in_us] | TOPICAL_OINTMENT | Freq: Four times a day (QID) | TRANSDERMAL | Status: DC
  Administered 2019-03-23 – 2019-03-25 (×7): 1 [in_us] via TOPICAL

## 2019-03-23 MED ORDER — HEPARIN BOLUS VIA INFUSION
4000.0000 [IU] | Freq: Once | INTRAVENOUS | Status: AC
  Administered 2019-03-23: 13:00:00 4000 [IU] via INTRAVENOUS
  Filled 2019-03-23: qty 4000

## 2019-03-23 MED ORDER — ENOXAPARIN SODIUM 40 MG/0.4ML ~~LOC~~ SOLN
40.0000 mg | SUBCUTANEOUS | Status: DC
  Administered 2019-03-23: 40 mg via SUBCUTANEOUS
  Filled 2019-03-23: qty 0.4

## 2019-03-23 MED ORDER — ASPIRIN 300 MG RE SUPP: 300.0000 mg | RECTAL | Status: DC

## 2019-03-23 MED ORDER — ASPIRIN 81 MG PO CHEW: 324.0000 mg | CHEWABLE_TABLET | ORAL | Status: DC

## 2019-03-23 MED ORDER — ASPIRIN 81 MG PO CHEW: 162.0000 mg | CHEWABLE_TABLET | Freq: Once | ORAL | Status: DC

## 2019-03-23 MED ORDER — CLONAZEPAM 0.5 MG PO TABS: 0.5000 mg | ORAL_TABLET | Freq: Two times a day (BID) | ORAL | Status: DC | PRN

## 2019-03-23 MED ORDER — NITROGLYCERIN 0.4 MG SL SUBL
0.4000 mg | SUBLINGUAL_TABLET | SUBLINGUAL | Status: DC | PRN
  Administered 2019-03-23: 06:00:00 0.4 mg via SUBLINGUAL
  Filled 2019-03-23: qty 1

## 2019-03-23 MED ORDER — CLOPIDOGREL BISULFATE 75 MG PO TABS
75.0000 mg | ORAL_TABLET | Freq: Every day | ORAL | Status: DC
  Administered 2019-03-24 – 2019-03-25 (×2): 75 mg via ORAL
  Filled 2019-03-23 (×2): qty 1

## 2019-03-23 MED ORDER — AMLODIPINE BESYLATE 5 MG PO TABS
5.0000 mg | ORAL_TABLET | Freq: Every day | ORAL | Status: DC
  Administered 2019-03-25 – 2019-03-26 (×2): 5 mg via ORAL
  Filled 2019-03-23 (×3): qty 1

## 2019-03-23 MED ORDER — HEPARIN (PORCINE) 25000 UT/250ML-% IV SOLN
950.0000 [IU]/h | INTRAVENOUS | Status: DC
  Administered 2019-03-23: 1000 [IU]/h via INTRAVENOUS
  Filled 2019-03-23: qty 250

## 2019-03-23 MED ORDER — PRAVASTATIN SODIUM 20 MG PO TABS
40.0000 mg | ORAL_TABLET | Freq: Every day | ORAL | Status: DC
  Administered 2019-03-23: 40 mg via ORAL
  Filled 2019-03-23: qty 2

## 2019-03-23 MED ORDER — ALPRAZOLAM 0.25 MG PO TABS: 0.2500 mg | ORAL_TABLET | Freq: Once | ORAL | Status: DC

## 2019-03-23 MED ORDER — DIPHENHYDRAMINE HCL 25 MG PO CAPS
25.0000 mg | ORAL_CAPSULE | Freq: Once | ORAL | Status: AC
  Administered 2019-03-23: 25 mg via ORAL
  Filled 2019-03-23: qty 1

## 2019-03-23 MED ORDER — ASPIRIN EC 81 MG PO TBEC
162.0000 mg | DELAYED_RELEASE_TABLET | Freq: Once | ORAL | Status: AC
  Administered 2019-03-23: 06:00:00 162 mg via ORAL
  Filled 2019-03-23: qty 2

## 2019-03-23 MED ORDER — ATORVASTATIN CALCIUM 80 MG PO TABS
80.0000 mg | ORAL_TABLET | Freq: Every day | ORAL | Status: DC
  Administered 2019-03-23 – 2019-03-25 (×3): 80 mg via ORAL
  Filled 2019-03-23: qty 1
  Filled 2019-03-23: qty 2
  Filled 2019-03-23: qty 1

## 2019-03-23 MED ORDER — NITROGLYCERIN 0.4 MG SL SUBL: 0.4000 mg | SUBLINGUAL_TABLET | SUBLINGUAL | Status: DC | PRN

## 2019-03-23 MED ORDER — ACETAMINOPHEN 325 MG PO TABS
650.0000 mg | ORAL_TABLET | ORAL | Status: DC | PRN
  Administered 2019-03-23 – 2019-03-24 (×2): 650 mg via ORAL
  Filled 2019-03-23 (×2): qty 2

## 2019-03-23 MED ORDER — NITROGLYCERIN 2 % TD OINT
0.5000 [in_us] | TOPICAL_OINTMENT | Freq: Four times a day (QID) | TRANSDERMAL | Status: DC
  Administered 2019-03-23: 12:00:00 0.5 [in_us] via TOPICAL
  Filled 2019-03-23: qty 30

## 2019-03-23 MED ORDER — FOLIC ACID 1 MG PO TABS
1.0000 mg | ORAL_TABLET | Freq: Every day | ORAL | Status: DC
  Administered 2019-03-23 – 2019-03-26 (×4): 1 mg via ORAL
  Filled 2019-03-23 (×4): qty 1

## 2019-03-23 NOTE — Progress Notes (Signed)
ANTICOAGULATION CONSULT NOTE - Initial Consult  Pharmacy Consult for heparin Indication: chest pain/ACS  Allergies  Allergen Reactions  . Bee Venom Anaphylaxis  . Chocolate Other (See Comments)    Causes migraines.  . Cucumber Extract Other (See Comments)    Cucumbers cause migraine headaches  . Onion Other (See Comments)    Onions cause migraine headaches  . Penicillins Other (See Comments)    "just not effective" Did it involve swelling of the face/tongue/throat, SOB, or low BP? No Did it involve sudden or severe rash/hives, skin peeling, or any reaction on the inside of your mouth or nose? No Did you need to seek medical attention at a hospital or doctor's office? No When did it last happen? If all above answers are "NO", may proceed with cephalosporin use.     Patient Measurements: Height: 5' 7.5" (171.5 cm) Weight: 186 lb 1.1 oz (84.4 kg) IBW/kg (Calculated) : 67.25 Heparin Dosing Weight: 83 kg  Vital Signs: Temp: 97.8 F (36.6 C) (07/18 0822) Temp Source: Oral (07/18 0822) BP: 140/96 (07/18 0822) Pulse Rate: 75 (07/18 0822)  Labs: Recent Labs    03/23/19 0534 03/23/19 0707 03/23/19 0909 03/23/19 1015  HGB 14.1  --  14.3  --   HCT 42.4  --  41.7  --   PLT 277  --  262  --   CREATININE 1.35*  --  1.28*  --   TROPONINIHS 23* 72* 121* 101*    Estimated Creatinine Clearance: 49 mL/min (A) (by C-G formula based on SCr of 1.28 mg/dL (H)).   Medical History: Past Medical History:  Diagnosis Date  . 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 Hemochromatosis   . HTN (hypertension), benign 02/06/2014   pt takes inderal for pre competition calmness  . 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 Other and unspecified hyperlipidemia 02/06/2014   Assessment: Patient admitted with complaints of chest pain. Pharmacy consulted to dose/monitor heparin drip for ACS/STEMI protocol. Patient is not taking anticoagulants PTA per med rec and insurance fill history.  Planning for transfer to [facility] for cardiac cath on Monday.  Today, 03/23/19  Hgb 14.3 - WNL  Plt 262 - WNL  SCr 1.3, CrCl ~ 50 mL/min  Enoxaparin 40 mg subQ daily ordered for DVT ppx  INR and aPTT ordered STAT  Goal of Therapy:  Heparin level 0.3-0.7 units/ml Monitor platelets by anticoagulation protocol: Yes   Plan:   Discontinued enoxaparin  Heparin 4000 units bolus x1  Initiate heparin drip at 1000 units/hr (12 u/kg/hr)  Check HL in 8 hours  CBC and HL daily while on heparin infusion  Lenis Noon, PharmD 03/23/2019,11:31 AM

## 2019-03-23 NOTE — H&P (Signed)
History and Physical    Jeffery Carpenter MPN:[id number] DOB: 01-11-1941 DOA: 03/23/2019  PCP: [name], L.Marlou Sa, MD  Patient coming from: Home  I have personally briefly reviewed patient's old medical records in [place]  Chief Complaint: Chest pain  HPI: Jeffery Carpenter is a 78 y.o. male with medical history significant of hyperlipidemia and ex-smoker presented to ED with a complaint of chest pain.  Cording to patient, he woke up at around 3 AM with chest pain.  The chest pain was mostly located on the left anterior side, was 8 out of 10, pressure-like, radiating to the neck and the left shoulder and arm.  It was associated with some dizziness but he denies any shortness of breath, nausea, diaphoresis, fever, chills, sweating, any sick contact or any recent travel.  ED Course: Upon arrival to the emergency department, he was slightly hypertensive with blood pressure of 184/115 and slightly tachycardic at 105.  Oxygen saturation was 100% on room air.  CBC and CMP was unremarkable.  Troponin was slightly elevated at 23.  EKG showed sinus rhythm with no acute ST-T wave changes.  Patient's chest pain was 4 out of 10 upon arrival to the ED and he was given a dose of nitroglycerin and aspirin 162 mg and he currently does not have any pain.  Upon further questioning, he tells me that he had a stress test done almost 40 years ago which was unremarkable and he has not had any cardiac issues since then.  Review of Systems: As per HPI otherwise 10 point review of systems negative.    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 He has quit using smokeless tobacco. He reports current alcohol use. He reports that he does not use drugs.  Allergies  Allergen Reactions   Bee Venom Anaphylaxis   Chocolate Other (See Comments)    Causes migraines.   Cucumber Extract Other (See Comments)    Cucumbers cause migraine headaches   Onion Other (See Comments)    Onions cause migraine headaches   Penicillins Other (See Comments)    Ineffective per pt    History reviewed. No pertinent family history.  Prior to Admission medications   Medication Sig Start Date End Date Taking? Authorizing Provider  clonazePAM (KLONOPIN) 0.5 MG tablet Take 0.5 mg by mouth 2 (two) times daily as needed for anxiety.  01/02/19   [provider]  EPINEPHrine 0.3 mg/0.3 mL IJ SOAJ injection Inject 0.3 mLs (0.3 mg total) into the muscle as needed for anaphylaxis. 02/02/19   Recardo Evangelist, PA-C  folic  acid (FOLVITE) 1 MG tablet Take 1 tablet (1 mg total) by mouth daily. 02/07/19   Brunetta Genera, MD  pravastatin (PRAVACHOL) 40 MG tablet Take 40 mg by mouth daily.      [provider]  propranolol (INDERAL) 40 MG tablet Take 40 mg by mouth See admin instructions. 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    [provider]  thiamine (VITAMIN B-1) 50 MG tablet Take 50 mg by mouth daily.      [provider]  triamcinolone cream (KENALOG) 0.5 % Apply 1 application topically  3 (three) times daily as needed (itching/ insect bites).    [provider]    Physical Exam: Vitals:   03/23/19 0534 03/23/19 0600 03/23/19 0630 03/23/19 0707  BP: (!) 137/96 (!) 123/94 (!) 122/95 113/87  Pulse: 99 (!) 103 90 79  Resp: 18 (!) 24 (!) 21 12  Temp:      TempSrc:      SpO2: 100% 97% 96% 100%  Weight:      Height:        Constitutional: NAD, calm, comfortable Vitals:   03/23/19 0534 03/23/19 0600 03/23/19 0630 03/23/19 0707  BP: (!) 137/96 (!) 123/94 (!) 122/95 113/87  Pulse: 99 (!) 103 90 79  Resp: 18 (!) 24 (!) 21 12  Temp:      TempSrc:      SpO2: 100% 97% 96% 100%  Weight:      Height:       Eyes: PERRL, lids and conjunctivae normal ENMT: Mucous membranes are moist. Posterior pharynx clear of any exudate or lesions.Normal dentition.  Neck: normal, supple, no masses, no thyromegaly Respiratory: clear to auscultation bilaterally, no wheezing, no crackles. Normal respiratory effort. No accessory muscle use.  Cardiovascular: Regular rate and rhythm, no murmurs / rubs / gallops. No extremity edema. 2+ pedal pulses. No carotid bruits.  Abdomen: no tenderness, no masses palpated. No hepatosplenomegaly. Bowel sounds positive.  Musculoskeletal: no clubbing / cyanosis. No joint deformity upper and lower extremities. Good ROM, no contractures. Normal muscle tone.  Skin: no rashes, lesions, ulcers. No induration Neurologic: CN 2-12 grossly intact. Sensation intact, DTR normal. Strength 5/5 in all 4.  Psychiatric: Normal judgment and insight. Alert and oriented x 3. Normal mood.    Labs on Admission: I have personally reviewed following labs and imaging studies  CBC: Recent Labs  Lab 03/23/19 0534  WBC 7.9  HGB 14.1  HCT 42.4  MCV 96.8  PLT 462   Basic Metabolic Panel: Recent Labs  Lab 03/23/19 0534  NA 139  K 4.1  CL 109  CO2 21*  GLUCOSE 177*  BUN 25*  CREATININE 1.35*  CALCIUM 8.9   GFR: Estimated Creatinine Clearance: 45.9 mL/min (A)  (by C-G formula based on SCr of 1.35 mg/dL (H)). Liver Function Tests: No results for input(s): AST, ALT, ALKPHOS, BILITOT, PROT, ALBUMIN in the last 168 hours. No results for input(s): LIPASE, AMYLASE in the last 168 hours. No results for input(s): AMMONIA in the last 168 hours. Coagulation Profile: No results for input(s): INR, PROTIME in the last 168 hours. Cardiac Enzymes: No results for input(s): CKTOTAL, CKMB, CKMBINDEX, TROPONINI in the last 168 hours. BNP (last 3 results) No results for input(s): PROBNP in the last 8760 hours. HbA1C: No results for input(s): HGBA1C  in the last 72 hours. CBG: No results for input(s): GLUCAP in the last 168 hours. Lipid Profile: No results for input(s): CHOL, HDL, LDLCALC, TRIG, CHOLHDL, LDLDIRECT in the last 72 hours. Thyroid Function Tests: No results for input(s): TSH, T4TOTAL, FREET4, T3FREE, THYROIDAB in the last 72 hours. Anemia Panel: No results for input(s): VITAMINB12, FOLATE, FERRITIN, TIBC, IRON, RETICCTPCT in the last 72 hours. Urine analysis: No results found for: COLORURINE, APPEARANCEUR, LABSPEC, PHURINE, GLUCOSEU, HGBUR, BILIRUBINUR, KETONESUR, PROTEINUR, UROBILINOGEN, NITRITE, LEUKOCYTESUR  Radiological Exams on Admission: Dg Chest 2 View  Result Date: 03/23/2019 CLINICAL DATA:  Initial evaluation for acute chest pressure, nausea, left arm pain. EXAM: CHEST - 2 VIEW COMPARISON:  Prior radiograph from 12/01/2005. FINDINGS: Cardiac and mediastinal silhouettes are stable in size and contour, and remain within normal limits. Aortic atherosclerosis. Lungs normally inflated. Mild left basilar subsegmental atelectasis. No focal infiltrates. No edema or effusion. No pneumothorax. No acute osseous finding. IMPRESSION: 1. Mild left basilar subsegmental atelectasis. No other active cardiopulmonary disease. 2. Aortic atherosclerosis. Electronically Signed   By: Jeannine Boga M.D.   On: 03/23/2019 06:08    EKG: Independently reviewed.   Sinus rhythm with no acute ST-T wave changes.  Assessment/Plan Active Problems:   Chest pain   Dyslipidemia    Typical chest pain: Due to being male, history of hyperlipidemia and age of 40, he does have couple of risk factors.  No family history of CAD.  He is ex-smoker.  He denies any history of hypertension and does not take any medications.  Troponin slightly elevated at 23 and per ED physician, this qualifies for observation admission so we will admit him and follow serial cardiac enzymes.  Order transthoracic echo.  Monitor on telemetry.  Check lipid panel and hemoglobin A1c.  Due to his risk factors, I will order nuclear stress test for the morning.  We will give him another dose of aspirin 162 mg chewable to make it a total of 324 mg.  Hyperlipidemia: Resume home dose of statin.  Elevated blood pressure: He tells me that he does not carry a history of hypertension but his blood pressure was slightly elevated upon presentation to the ED which has been better since then.  Will monitor closely and place him on PRN hydralazine.  DVT prophylaxis: Lovenox Code Status: Full code Family Communication: None present at bedside.  Plan of care discussed with patient in detail. Disposition Plan: Likely home tomorrow. Consults called: None Admission status: Observation   Darliss Cheney MD [facility] Pager [phone]  If 7PM-7AM, please contact night-coverage [email or web address] Password [facility]  03/23/2019, 7:47 AM

## 2019-03-23 NOTE — ED Provider Notes (Signed)
[facility] DEPT Provider Note   CSN: [id number] Arrival date & time: 03/23/19  0459    History   Chief Complaint Chief Complaint  Patient presents with   Arm Pain   Chest Pain    HPI Jeffery Carpenter is a 78 y.o. male.  HPI: A 78 year old patient with a history of hypercholesterolemia presents for evaluation of chest pain. Initial onset of pain was approximately 1-3 hours ago. The patient's chest pain is described as heaviness/pressure/tightness and is not worse with exertion. The patient reports some diaphoresis. The patient's chest pain is middle- or left-sided, is not well-localized, is not sharp and does radiate to the arms/jaw/neck. The patient does not complain of nausea. The patient has no history of stroke, has no history of peripheral artery disease, has not smoked in the past 90 days, denies any history of treated diabetes, has no relevant family history of coronary artery disease (first degree relative at less than age 67), is not hypertensive and does not have an elevated BMI (>=30).   The history is provided by the patient.  Arm Pain Associated symptoms include chest pain and shortness of breath.  Chest Pain Pain location:  L chest Pain severity:  Moderate Onset quality:  Sudden Timing:  Constant Progression:  Worsening Chronicity:  New Relieved by:  Nothing Worsened by:  Nothing Ineffective treatments:  Aspirin Associated symptoms: diaphoresis and shortness of breath   Associated symptoms: no cough and no fever    Patient with hemochromatosis, hypertension, hyperlipidemia presents with chest and arm pain.  He reports he woke up with left arm and chest pain.  He reports feeling diaphoretic and did have shortness of breath.  He has not had this before.  No fevers or vomiting.  No coughing.  He does give blood transfusions for his hemochromatosis.  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   Other and unspecified hyperlipidemia 02/06/2014   HTN (hypertension), benign 02/06/2014   Hemochromatosis 05/08/2012    Past Surgical History:  Procedure Laterality Date   APPENDECTOMY     BILATERAL CARPAL TUNNEL RELEASE Bilateral    EYE SURGERY     cataract   HERNIA REPAIR     abdomen   JOINT REPLACEMENT     lt TKA   KNEE ARTHROSCOPY Right    MASS EXCISION Left 09/12/2014   Procedure: LEFT ELBOW AND FOREARM MASS REMOVAL;  Surgeon: Roseanne Kaufman, MD;  Location: [facility];  Service: Orthopedics;  Laterality: Left;   TONSILLECTOMY     WRIST SURGERY Left         Home Medications    Prior to Admission medications   Medication Sig Start Date End Date Taking? Authorizing Provider  clonazePAM (KLONOPIN) 0.5 MG tablet Take 0.5 mg by mouth 2 (two) times daily as needed for anxiety.  01/02/19   [provider]  EPINEPHrine 0.3 mg/0.3 mL IJ SOAJ injection Inject 0.3 mLs (0.3 mg total) into the muscle as needed for anaphylaxis. 02/02/19   Recardo Evangelist, PA-C  folic acid (FOLVITE) 1 MG tablet Take 1 tablet (1 mg total) by mouth daily. 02/07/19  Brunetta Genera, MD  pravastatin (PRAVACHOL) 40 MG tablet Take 40 mg by mouth daily.      [provider]  propranolol (INDERAL) 40 MG tablet Take 40 mg by mouth See admin instructions. 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    [provider]  thiamine (VITAMIN B-1) 50 MG tablet Take 50 mg by mouth daily.      [provider]  triamcinolone cream (KENALOG)  0.5 % Apply 1 application topically 3 (three) times daily as needed (itching/ insect bites).    [provider]    Family History History reviewed. No pertinent family history.  Social History Social History   Tobacco Use   Smoking status: Former Smoker    Quit date: 02/07/1960    Years since quitting: 59.1   Smokeless tobacco: Former Systems developer  Substance Use Topics   Alcohol use: Yes    Alcohol/week: 0.0 standard drinks    Comment: socially.   Drug use: No     Allergies   Bee venom, Chocolate, Cucumber extract, Onion, and Penicillins   Review of Systems Review of Systems  Constitutional: Positive for diaphoresis. Negative for fever.  Respiratory: Positive for shortness of breath. Negative for cough.   Cardiovascular: Positive for chest pain.  All other systems reviewed and are negative.    Physical Exam Updated Vital Signs BP (!) 184/115 (BP Location: Right Arm)    Pulse (!) 105    Temp 97.7 F (36.5 C) (Oral)    Resp 18    Ht 1.702 m (5\' 7" )    Wt 83.9 kg    SpO2 96%    BMI 28.98 kg/m   Physical Exam CONSTITUTIONAL: Elderly, mildly anxious HEAD: Normocephalic/atraumatic EYES: EOMI/PERRL ENMT: Mucous membranes moist NECK: supple no meningeal signs SPINE/BACK:entire spine nontender CV: S1/S2 noted, no murmurs/rubs/gallops noted LUNGS: Lungs are clear to auscultation bilaterally, no apparent distress Chest-no tenderness. ABDOMEN: soft, nontender, no rebound or guarding, bowel sounds noted throughout abdomen GU:no cva tenderness NEURO: Pt is awake/alert/appropriate, moves all extremitiesx4.  No facial droop.   EXTREMITIES: pulses normal/equal x4, full ROM, no tenderness or deformity to left arm SKIN: warm, color normal PSYCH: Anxious   ED Treatments / Results  Labs (all labs ordered are listed, but only abnormal results are displayed) Labs Reviewed  BASIC METABOLIC PANEL - Abnormal; Notable for the following components:      Result Value   CO2 21  (*)    Glucose, Bld 177 (*)    BUN 25 (*)    Creatinine, Ser 1.35 (*)    GFR calc non Af Amer 50 (*)    GFR calc Af Amer 57 (*)    All other components within normal limits  TROPONIN I (HIGH SENSITIVITY) - Abnormal; Notable for the following components:   Troponin I (High Sensitivity) 23 (*)    All other components within normal limits  SARS CORONAVIRUS 2 (HOSPITAL ORDER, [facility])  CBC  TROPONIN I (HIGH SENSITIVITY)    EKG EKG Interpretation  Date/Time:  Saturday March 23 2019 05:14:36 EDT Ventricular Rate:  87 PR Interval:    QRS Duration: 92 QT Interval:  360 QTC Calculation: 433 R Axis:   9 Text Interpretation:  Sinus rhythm Low voltage, precordial leads No significant change since last tracing Confirmed by  Ripley Fraise ([id number]) on 03/23/2019 5:20:28 AM   Radiology Dg Chest 2 View  Result Date: 03/23/2019 CLINICAL DATA:  Initial evaluation for acute chest pressure, nausea, left arm pain. EXAM: CHEST - 2 VIEW COMPARISON:  Prior radiograph from 12/01/2005. FINDINGS: Cardiac and mediastinal silhouettes are stable in size and contour, and remain within normal limits. Aortic atherosclerosis. Lungs normally inflated. Mild left basilar subsegmental atelectasis. No focal infiltrates. No edema or effusion. No pneumothorax. No acute osseous finding. IMPRESSION: 1. Mild left basilar subsegmental atelectasis. No other active cardiopulmonary disease. 2. Aortic atherosclerosis. Electronically Signed   By: Jeannine Boga M.D.   On: 03/23/2019 06:08    Procedures .Critical Care Performed by: Ripley Fraise, MD Authorized by: Ripley Fraise, MD   Critical care provider statement:    Critical care time (minutes):  45   Critical care start time:  03/23/2019 6:00 AM   Critical care end time:  03/23/2019 6:45 AM   Critical care was necessary to treat or prevent imminent or life-threatening deterioration of the following conditions:  Cardiac failure and  circulatory failure   Critical care was time spent personally by me on the following activities:  Development of treatment plan with patient or surrogate, evaluation of patient's response to treatment, examination of patient, re-evaluation of patient's condition, pulse oximetry, ordering and review of radiographic studies, ordering and review of laboratory studies, ordering and performing treatments and interventions and obtaining history from patient or surrogate   I assumed direction of critical care for this patient from another provider in my specialty: no       Medications Ordered in ED Medications  nitroGLYCERIN (NITROSTAT) SL tablet 0.4 mg (0.4 mg Sublingual Given 03/23/19 0542)  sodium chloride flush (NS) 0.9 % injection 3 mL (3 mLs Intravenous Given 03/23/19 0543)  aspirin EC tablet 162 mg (162 mg Oral Given 03/23/19 0541)     Initial Impression / Assessment and Plan / ED Course  I have reviewed the triage vital signs and the nursing notes.  Pertinent labs & imaging results that were available during my care of the patient were reviewed by me and considered in my medical decision making (see chart for details).     HEAR Score: 5  6:13 AM Patient is a 78 year old male who presents with chest and arm pain and diaphoresis.  He reports this is his first episode.  He had some improvement with nitroglycerin, but reported worsening pain with exertion.  I am strongly suspicious for unstable angina. There are no acute EKG changes.  Work-up is pending at this time 6:44 AM Pt feeling improved Troponin level indicates need for admission I have low suspicion for PE/Dissection as cause of CP 6:59 AM Pt to be admitted Signed out to dr zammit with admission consult pending Pt is CP Free at this time  Final Clinical Impressions(s) / ED Diagnoses   Final diagnoses:  Unstable angina [facility])    ED Discharge Orders    None       Ripley Fraise, MD 03/23/19 [phone]

## 2019-03-23 NOTE — Progress Notes (Signed)
  Echocardiogram 2D Echocardiogram has been performed.  Darlina Sicilian M 03/23/2019, 10:16 AM

## 2019-03-23 NOTE — Progress Notes (Signed)
CRITICAL VALUE ALERT  Critical Value:  Troponin 121  Date & Time Notied:  03/23/19, 7409  Provider Notified: Doristine Bosworth, MD  Orders Received/Actions taken: No new orders received. No chest pain since admission. VSS. Pt alert, stable, in bed eating breakfast.

## 2019-03-23 NOTE — ED Notes (Signed)
Pt CP relieved after 1 Nitro

## 2019-03-23 NOTE — Progress Notes (Signed)
ANTICOAGULATION CONSULT NOTE - Follow Up Consult  Pharmacy Consult for Heparin Indication: chest pain/ACS  Allergies  Allergen Reactions  . Bee Venom Anaphylaxis  . Chocolate Other (See Comments)    Causes migraines.  . Cucumber Extract Other (See Comments)    Cucumbers cause migraine headaches  . Onion Other (See Comments)    Onions cause migraine headaches  . Penicillins Other (See Comments)    "just not effective" Did it involve swelling of the face/tongue/throat, SOB, or low BP? No Did it involve sudden or severe rash/hives, skin peeling, or any reaction on the inside of your mouth or nose? No Did you need to seek medical attention at a hospital or doctor's office? No When did it last happen? If all above answers are "NO", may proceed with cephalosporin use.     Patient Measurements: Height: 5' 7.5" (171.5 cm) Weight: 186 lb 1.1 oz (84.4 kg) IBW/kg (Calculated) : 67.25 Heparin Dosing Weight:   Vital Signs: Temp: 97.5 F (36.4 C) (07/18 2001) Temp Source: Oral (07/18 2001) BP: 109/82 (07/18 2001) Pulse Rate: 63 (07/18 2001)  Labs: Recent Labs    03/23/19 0534 03/23/19 0707 03/23/19 0909 03/23/19 1015 03/23/19 1151 03/23/19 2103  HGB 14.1  --  14.3  --   --   --   HCT 42.4  --  41.7  --   --   --   PLT 277  --  262  --   --   --   APTT  --   --   --   --  30  --   LABPROT  --   --   --   --  13.9  --   INR  --   --   --   --  1.1  --   HEPARINUNFRC  --   --   --   --   --  0.75*  CREATININE 1.35*  --  1.28*  --   --   --   TROPONINIHS 23* 72* 121* 101*  --   --     Estimated Creatinine Clearance: 49 mL/min (A) (by C-G formula based on SCr of 1.28 mg/dL (H)).   Medications:  Infusions:  . heparin 1,000 Units/hr (03/23/19 1227)    Assessment: Patient with high heparin level.  No heparin issues per RN.  Goal of Therapy:  Heparin level 0.3-0.7 units/ml Monitor platelets by anticoagulation protocol: Yes   Plan:  Decrease heparin to 950  units/hr Recheck level at [address], [place] 03/23/2019,10:45 PM

## 2019-03-23 NOTE — ED Triage Notes (Signed)
Pt reports waking up sweaty with L arm pain, chest pressure, and nausea. Denies cardiac hx.  Pt took 2 baby aspirins PTA (around 4a)

## 2019-03-23 NOTE — Progress Notes (Signed)
Around 1215 this shift, pt called RN with complaints of sudden chest pain. Pt rated chest pain a 3 out of 10. C/o pain in left chest, down left arm (mainly in left upper arm and left wrist). Pt also c/o chest tightness and pressure. C/o "indigestion" and some nausea. Pt mildly diaphretic. Pt very nervous, having mild tremors. BP elevated. Nitro paste applied and BB given, along with Plavix and initiation of Heparin gtt. EKG obtained. Pain was relieved after a few minutes. MD Pahwani contacted who gave VO for 1x Xanax- pt does not want at this time. MD Harwani also contacted to be made aware. Gave VO to increase Nitro paste dose, and to give Norvasc 5mg  daily if SBP still 160s-180s. BP now 137/95. Will hold Norvasc for now. Pt continues to have no pain at this time. Will continue to monitor very closely.

## 2019-03-23 NOTE — Consult Note (Signed)
Reason for Consult: Chest pain elevated cardiac enzymes Referring Physician: [facility]  Jeffery Carpenter is an 78 y.o. male.  HPI: Patient is 78 year old male with past medical history significant for hyperlipidemia, remote tobacco abuse, hemochromatosis was admitted earlier this morning woke up with chest pain described as pressure radiating to left shoulder and arm associated with mild diaphoresis took 2 baby aspirin at home and received 2 more aspirin and sublingual nitro with relief of chest pain.  EKG done in the ED showed no significant acute ischemic changes high-sensitivity troponin I is minimally elevated and trending up.  Patient denies such episodes of chest pain in the past but states lately gets tired fatigued and weak with exertion.  Denies any cardiac work-up in the past.  Denies any palpitation lightheadedness or syncope.  Denies PND orthopnea leg swelling.  Denies any claudication pain.  States his father died of MI in his 80s.  Past Medical History:  Diagnosis Date  . 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 Hemochromatosis   . HTN (hypertension), benign 02/06/2014   pt takes inderal for pre competition calmness  . 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 Other and unspecified hyperlipidemia 02/06/2014    Past Surgical History:  Procedure Laterality Date  . APPENDECTOMY    . BILATERAL CARPAL TUNNEL RELEASE Bilateral   . EYE SURGERY     cataract  . HERNIA REPAIR     abdomen  . JOINT REPLACEMENT     lt TKA  . KNEE ARTHROSCOPY Right   . MASS EXCISION Left 09/12/2014   Procedure: LEFT ELBOW AND FOREARM MASS REMOVAL;  Surgeon: Roseanne Kaufman, MD;  Location: [facility];  Service: Orthopedics;  Laterality: Left;  . TONSILLECTOMY    . WRIST SURGERY Left     History reviewed. No pertinent family history.  Social History:  reports that he quit smoking about 59 years ago. He has quit using smokeless tobacco. He reports current alcohol use. He reports that  he does not use drugs.  Allergies:  Allergies  Allergen Reactions  . Bee Venom Anaphylaxis  . Chocolate Other (See Comments)    Causes migraines.  . Cucumber Extract Other (See Comments)    Cucumbers cause migraine headaches  . Onion Other (See Comments)    Onions cause migraine headaches  . Penicillins Other (See Comments)    "just not effective" Did it involve swelling of the face/tongue/throat, SOB, or low BP? No Did it involve sudden or severe rash/hives, skin peeling, or any reaction on the inside of your mouth or nose? No Did you need to seek medical attention at a hospital or doctor's office? No When did it last happen? If all above answers are "NO", may proceed with cephalosporin use.     Medications: I have reviewed the patient's current medications.  Results for orders placed or performed during the hospital encounter of 03/23/19 (from the past 48 hour(s))  Basic metabolic panel     Status: Abnormal   Collection Time: 03/23/19  5:34 AM  Result Value Ref Range   Sodium 139 135 - 145 mmol/L   Potassium 4.1 3.5 - 5.1 mmol/L   Chloride 109 98 - 111 mmol/L   CO2 21 (L) 22 - 32 mmol/L   Glucose, Bld 177 (H) 70 - 99 mg/dL   BUN 25 (H) 8 - 23 mg/dL   Creatinine, Ser 1.35 (H) 0.61 - 1.24 mg/dL   Calcium 8.9 8.9 - 10.3 mg/dL  GFR calc non Af Amer 50 (L) >60 mL/min   GFR calc Af Amer 57 (L) >60 mL/min   Anion gap 9 5 - 15    Comment: Performed at [facility], [place] [address] [place], [place] [address]  CBC     Status: None   Collection Time: 03/23/19  5:34 AM  Result Value Ref Range   WBC 7.9 4.0 - 10.5 K/uL   RBC 4.38 4.22 - 5.81 MIL/uL   Hemoglobin 14.1 13.0 - 17.0 g/dL   HCT 42.4 39.0 - 52.0 %   MCV 96.8 80.0 - 100.0 fL   MCH 32.2 26.0 - 34.0 pg   MCHC 33.3 30.0 - 36.0 g/dL   RDW 12.3 11.5 - 15.5 %   Platelets 277 150 - 400 K/uL   nRBC 0.0 0.0 - 0.2 %    Comment: Performed at [facility], [place] [address],  [place], [place] [address]  Troponin I (High Sensitivity)     Status: Abnormal   Collection Time: 03/23/19  5:34 AM  Result Value Ref Range   Troponin I (High Sensitivity) 23 (H) <18 ng/L    Comment: (NOTE) Elevated high sensitivity troponin I (hsTnI) values and significant  changes across serial measurements may suggest ACS but many other  chronic and acute conditions are known to elevate hsTnI results.  Refer to the "Links" section for chest pain algorithms and additional  guidance. Performed at [facility], [place] [address] [place],  [address]   SARS Coronavirus 2 (CEPHEID - Performed in [place] hospital lab), Hosp Order     Status: None   Collection Time: 03/23/19  6:12 AM   Specimen: Nasopharyngeal Swab  Result Value Ref Range   SARS Coronavirus 2 NEGATIVE NEGATIVE    Comment: (NOTE) If result is NEGATIVE SARS-CoV-2 target nucleic acids are NOT DETECTED. The SARS-CoV-2 RNA is generally detectable in upper and lower  respiratory specimens during the acute phase of infection. The lowest  concentration of SARS-CoV-2 viral copies this assay can detect is 250  copies / mL. A negative result does not preclude SARS-CoV-2 infection  and should not be used as the sole basis for treatment or other  patient management decisions.  A negative result may occur with  improper specimen collection / handling, submission of specimen other  than nasopharyngeal swab, presence of viral mutation(s) within the  areas targeted by this assay, and inadequate number of viral copies  (<250 copies / mL). A negative result must be combined with clinical  observations, patient history, and epidemiological information. If result is POSITIVE SARS-CoV-2 target nucleic acids are DETECTED. The SARS-CoV-2 RNA is generally detectable in upper and lower  respiratory specimens dur ing the acute phase of infection.  Positive  results are indicative of active infection with SARS-CoV-2.   Clinical  correlation with patient history and other diagnostic information is  necessary to determine patient infection status.  Positive results do  not rule out bacterial infection or co-infection with other viruses. If result is PRESUMPTIVE POSTIVE SARS-CoV-2 nucleic acids MAY BE PRESENT.   A presumptive positive result was obtained on the submitted specimen  and confirmed on repeat testing.  While 2019 novel coronavirus  (SARS-CoV-2) nucleic acids may be present in the submitted sample  additional confirmatory testing may be necessary for epidemiological  and / or clinical management purposes  to differentiate between  SARS-CoV-2 and other Sarbecovirus currently known to infect humans.  If clinically indicated additional  testing with an alternate test  methodology [phone]) is advised. The SARS-CoV-2 RNA is generally  detectable in upper and lower respiratory sp ecimens during the acute  phase of infection. The expected result is Negative. Fact Sheet for Patients:  StrictlyIdeas.no Fact Sheet for Healthcare Providers: BankingDealers.co.za This test is not yet approved or cleared by the [place] FDA and has been authorized for detection and/or diagnosis of SARS-CoV-2 by FDA under an Emergency Use Authorization (EUA).  This EUA will remain in effect (meaning this test can be used) for the duration of the COVID-19 declaration under Section 564(b)(1) of the Act, 21 U.S.C. section 360bbb-3(b)(1), unless the authorization is terminated or revoked sooner. Performed at [facility], [place] [address], [place], [place] [address]   Troponin I (High Sensitivity)     Status: Abnormal   Collection Time: 03/23/19  7:07 AM  Result Value Ref Range   Troponin I (High Sensitivity) 72 (H) <18 ng/L    Comment: DELTA CHECK NOTED (NOTE) Elevated high sensitivity troponin I (hsTnI) values and significant  changes across serial  measurements may suggest ACS but many other  chronic and acute conditions are known to elevate hsTnI results.  Refer to the Links section for chest pain algorithms and additional  guidance. Performed at [facility], [place] [address] [place], [place] [address]   CBC     Status: None   Collection Time: 03/23/19  9:09 AM  Result Value Ref Range   WBC 7.5 4.0 - 10.5 K/uL   RBC 4.31 4.22 - 5.81 MIL/uL   Hemoglobin 14.3 13.0 - 17.0 g/dL   HCT 41.7 39.0 - 52.0 %   MCV 96.8 80.0 - 100.0 fL   MCH 33.2 26.0 - 34.0 pg   MCHC 34.3 30.0 - 36.0 g/dL   RDW 12.3 11.5 - 15.5 %   Platelets 262 150 - 400 K/uL   nRBC 0.0 0.0 - 0.2 %    Comment: Performed at [facility], [place] [address], [place], [place] [address]  Creatinine, serum     Status: Abnormal   Collection Time: 03/23/19  9:09 AM  Result Value Ref Range   Creatinine, Ser 1.28 (H) 0.61 - 1.24 mg/dL   GFR calc non Af Amer 53 (L) >60 mL/min   GFR calc Af Amer >60 >60 mL/min    Comment: Performed at [facility], [place] [address], [place], [place] [address]  Magnesium     Status: None   Collection Time: 03/23/19  9:09 AM  Result Value Ref Range   Magnesium 2.3 1.7 - 2.4 mg/dL    Comment: Performed at [facility], [place] [address] [place], [place] [address]  TSH     Status: Abnormal   Collection Time: 03/23/19  9:09 AM  Result Value Ref Range   TSH 5.279 (H) 0.350 - 4.500 uIU/mL    Comment: Performed by a 3rd Generation assay with a functional sensitivity of <=0.01 uIU/mL. Performed at [facility], [place] [address], [place], [place] [address]   Troponin I (High Sensitivity)     Status: Abnormal   Collection Time: 03/23/19  9:09 AM  Result Value Ref Range   Troponin I (High Sensitivity) 121 (HH) <18 ng/L    Comment: DELTA CHECK NOTED CRITICAL RESULT CALLED TO, READ BACK BY AND VERIFIED WITH: SEAY,K. RN AT 8416 03/23/19 MULLINS,T (NOTE) Elevated  high sensitivity troponin I (hsTnI) values and significant  changes across serial measurements  may suggest ACS but many other  chronic and acute conditions are known to elevate hsTnI results.  Refer to the Links section for chest pain algorithms and additional  guidance. Performed at [facility], [place] [address] [place], [place] [address]     Dg Chest 2 View  Result Date: 03/23/2019 CLINICAL DATA:  Initial evaluation for acute chest pressure, nausea, left arm pain. EXAM: CHEST - 2 VIEW COMPARISON:  Prior radiograph from 12/01/2005. FINDINGS: Cardiac and mediastinal silhouettes are stable in size and contour, and remain within normal limits. Aortic atherosclerosis. Lungs normally inflated. Mild left basilar subsegmental atelectasis. No focal infiltrates. No edema or effusion. No pneumothorax. No acute osseous finding. IMPRESSION: 1. Mild left basilar subsegmental atelectasis. No other active cardiopulmonary disease. 2. Aortic atherosclerosis. Electronically Signed   By: Jeannine Boga M.D.   On: 03/23/2019 06:08    Review of Systems  Constitutional: Positive for diaphoresis. Negative for chills and fever.  HENT: Negative for hearing loss.   Eyes: Negative for blurred vision.  Respiratory: Negative for cough, hemoptysis, sputum production and shortness of breath.   Cardiovascular: Positive for chest pain. Negative for orthopnea, claudication and leg swelling.  Gastrointestinal: Negative for abdominal pain and vomiting.  Genitourinary: Negative for dysuria.  Skin: Negative for rash.  Neurological: Negative for dizziness.   Blood pressure (!) 140/96, pulse 75, temperature 97.8 F (36.6 C), temperature source Oral, resp. rate 18, height 5' 7.5" (1.715 m), weight 84.4 kg, SpO2 100 %. Physical Exam  Constitutional: He is oriented to person, place, and time.  HENT:  Head: Normocephalic and atraumatic.  Eyes: Pupils are equal, round, and reactive to light.  Conjunctivae are normal. Left eye exhibits no discharge. No scleral icterus.  Neck: Normal range of motion. Neck supple. No JVD present. No tracheal deviation present. No thyromegaly present.  Cardiovascular: Normal rate and regular rhythm. Exam reveals gallop (S4 gallop noted).  No murmur heard. Respiratory: Effort normal and breath sounds normal. No respiratory distress. He has no wheezes. He has no rales.  GI: Soft. Bowel sounds are normal. He exhibits no distension. There is no abdominal tenderness. There is no rebound.  Musculoskeletal:        General: No tenderness, deformity or edema.  Neurological: He is alert and oriented to person, place, and time.    Assessment/Plan: Acute coronary syndrome New onset hypertension New onset diabetes mellitus Hyperlipidemia Remote tobacco abuse History of hemochromatosis Elevated TSH rule out hypothyroidism Plan Check serial enzymes and EKG Check lipid panel and hemoglobin A1c Start metoprolol 25 mg twice daily Change pravastatin to Lipitor 80 mg daily Start clopidogrel 300 mg bolus and then 75 mg daily Start Nitropaste half inch every 6 hours Discussed with patient at length regarding left cardiac catheterization possible PTCA stenting its risk and benefits i.e. death MI stroke need for emergency CABG local vascular complications risk of restenosis etc. and consents for PCI Okay to transfer to [facility] will schedule for cath on Monday  [name] 03/23/2019, 10:54 AM

## 2019-03-24 DIAGNOSIS — I259 Chronic ischemic heart disease, unspecified: Secondary | ICD-10-CM | POA: Diagnosis not present

## 2019-03-24 DIAGNOSIS — F419 Anxiety disorder, unspecified: Secondary | ICD-10-CM | POA: Diagnosis present

## 2019-03-24 DIAGNOSIS — E1169 Type 2 diabetes mellitus with other specified complication: Secondary | ICD-10-CM | POA: Diagnosis present

## 2019-03-24 DIAGNOSIS — R0782 Intercostal pain: Secondary | ICD-10-CM | POA: Diagnosis not present

## 2019-03-24 DIAGNOSIS — Z880 Allergy status to penicillin: Secondary | ICD-10-CM | POA: Diagnosis not present

## 2019-03-24 DIAGNOSIS — R946 Abnormal results of thyroid function studies: Secondary | ICD-10-CM | POA: Diagnosis present

## 2019-03-24 DIAGNOSIS — Z8249 Family history of ischemic heart disease and other diseases of the circulatory system: Secondary | ICD-10-CM | POA: Diagnosis not present

## 2019-03-24 DIAGNOSIS — I200 Unstable angina: Secondary | ICD-10-CM | POA: Diagnosis not present

## 2019-03-24 DIAGNOSIS — I129 Hypertensive chronic kidney disease with stage 1 through stage 4 chronic kidney disease, or unspecified chronic kidney disease: Secondary | ICD-10-CM | POA: Diagnosis present

## 2019-03-24 DIAGNOSIS — E1122 Type 2 diabetes mellitus with diabetic chronic kidney disease: Secondary | ICD-10-CM | POA: Diagnosis present

## 2019-03-24 DIAGNOSIS — N182 Chronic kidney disease, stage 2 (mild): Secondary | ICD-10-CM | POA: Diagnosis present

## 2019-03-24 DIAGNOSIS — Z20828 Contact with and (suspected) exposure to other viral communicable diseases: Secondary | ICD-10-CM | POA: Diagnosis present

## 2019-03-24 DIAGNOSIS — Z79899 Other long term (current) drug therapy: Secondary | ICD-10-CM | POA: Diagnosis not present

## 2019-03-24 DIAGNOSIS — Z87891 Personal history of nicotine dependence: Secondary | ICD-10-CM | POA: Diagnosis not present

## 2019-03-24 DIAGNOSIS — R079 Chest pain, unspecified: Secondary | ICD-10-CM | POA: Diagnosis present

## 2019-03-24 DIAGNOSIS — I249 Acute ischemic heart disease, unspecified: Secondary | ICD-10-CM | POA: Diagnosis not present

## 2019-03-24 DIAGNOSIS — I214 Non-ST elevation (NSTEMI) myocardial infarction: Secondary | ICD-10-CM | POA: Diagnosis present

## 2019-03-24 DIAGNOSIS — I2542 Coronary artery dissection: Secondary | ICD-10-CM | POA: Diagnosis present

## 2019-03-24 DIAGNOSIS — E785 Hyperlipidemia, unspecified: Secondary | ICD-10-CM | POA: Diagnosis present

## 2019-03-24 LAB — CBC WITH DIFFERENTIAL/PLATELET
Abs Immature Granulocytes: 0.02 10*3/uL (ref 0.00–0.07)
Basophils Absolute: 0.1 10*3/uL (ref 0.0–0.1)
Basophils Relative: 1 %
Eosinophils Absolute: 0.3 10*3/uL (ref 0.0–0.5)
Eosinophils Relative: 3 %
HCT: 41 % (ref 39.0–52.0)
Hemoglobin: 13.9 g/dL (ref 13.0–17.0)
Immature Granulocytes: 0 %
Lymphocytes Relative: 31 %
Lymphs Abs: 3.1 10*3/uL (ref 0.7–4.0)
MCH: 33 pg (ref 26.0–34.0)
MCHC: 33.9 g/dL (ref 30.0–36.0)
MCV: 97.4 fL (ref 80.0–100.0)
Monocytes Absolute: 0.8 10*3/uL (ref 0.1–1.0)
Monocytes Relative: 8 %
Neutro Abs: 5.6 10*3/uL (ref 1.7–7.7)
Neutrophils Relative %: 57 %
Platelets: 265 10*3/uL (ref 150–400)
RBC: 4.21 MIL/uL — ABNORMAL LOW (ref 4.22–5.81)
RDW: 12.5 % (ref 11.5–15.5)
WBC: 9.9 10*3/uL (ref 4.0–10.5)
nRBC: 0 % (ref 0.0–0.2)

## 2019-03-24 LAB — CBC
HCT: 39.2 % (ref 39.0–52.0)
Hemoglobin: 13.4 g/dL (ref 13.0–17.0)
MCH: 33.1 pg (ref 26.0–34.0)
MCHC: 34.2 g/dL (ref 30.0–36.0)
MCV: 96.8 fL (ref 80.0–100.0)
Platelets: 238 10*3/uL (ref 150–400)
RBC: 4.05 MIL/uL — ABNORMAL LOW (ref 4.22–5.81)
RDW: 12.6 % (ref 11.5–15.5)
WBC: 8.8 10*3/uL (ref 4.0–10.5)
nRBC: 0 % (ref 0.0–0.2)

## 2019-03-24 LAB — HEPARIN LEVEL (UNFRACTIONATED)
Heparin Unfractionated: 0.75 IU/mL — ABNORMAL HIGH (ref 0.30–0.70)
Heparin Unfractionated: 0.52 IU/mL (ref 0.30–0.70)

## 2019-03-24 LAB — BASIC METABOLIC PANEL
Anion gap: 9 (ref 5–15)
BUN: 22 mg/dL (ref 8–23)
CO2: 24 mmol/L (ref 22–32)
Calcium: 9.3 mg/dL (ref 8.9–10.3)
Chloride: 107 mmol/L (ref 98–111)
Creatinine, Ser: 1.29 mg/dL — ABNORMAL HIGH (ref 0.61–1.24)
GFR calc Af Amer: >60 mL/min (ref >60)
GFR calc non Af Amer: 52 mL/min — ABNORMAL LOW (ref >60)
Glucose, Bld: 175 mg/dL — ABNORMAL HIGH (ref 70–99)
Potassium: 4.7 mmol/L (ref 3.5–5.1)
Sodium: 140 mmol/L (ref 135–145)

## 2019-03-24 LAB — LIPID PANEL
Cholesterol: 136 mg/dL (ref 0–200)
HDL: 42 mg/dL (ref >40)
LDL Cholesterol: 79 mg/dL (ref 0–99)
Total CHOL/HDL Ratio: 3.2 RATIO
Triglycerides: 74 mg/dL (ref <150)
VLDL: 15 mg/dL (ref 0–40)

## 2019-03-24 LAB — GLUCOSE, CAPILLARY
Glucose-Capillary: 131 mg/dL — ABNORMAL HIGH (ref 70–99)
Glucose-Capillary: 125 mg/dL — ABNORMAL HIGH (ref 70–99)

## 2019-03-24 MED ORDER — SODIUM CHLORIDE 0.9% FLUSH
3.0000 mL | Freq: Two times a day (BID) | INTRAVENOUS | Status: DC
  Administered 2019-03-24 – 2019-03-25 (×2): 3 mL via INTRAVENOUS

## 2019-03-24 MED ORDER — SODIUM CHLORIDE 0.9 % IV SOLN: 250.0000 mL | INTRAVENOUS | Status: DC | PRN

## 2019-03-24 MED ORDER — DIPHENHYDRAMINE HCL 25 MG PO CAPS
25.0000 mg | ORAL_CAPSULE | Freq: Once | ORAL | Status: AC
  Administered 2019-03-24: 23:00:00 25 mg via ORAL
  Filled 2019-03-24: qty 1

## 2019-03-24 MED ORDER — HEPARIN (PORCINE) 25000 UT/250ML-% IV SOLN
850.0000 [IU]/h | INTRAVENOUS | Status: DC
  Administered 2019-03-24: 850 [IU]/h via INTRAVENOUS
  Filled 2019-03-24: qty 250

## 2019-03-24 MED ORDER — ASPIRIN 81 MG PO CHEW
81.0000 mg | CHEWABLE_TABLET | ORAL | Status: AC
  Administered 2019-03-25: 07:00:00 81 mg via ORAL
  Filled 2019-03-24: qty 1

## 2019-03-24 MED ORDER — INSULIN ASPART 100 UNIT/ML ~~LOC~~ SOLN: 0.0000 [IU] | Freq: Three times a day (TID) | SUBCUTANEOUS | Status: DC

## 2019-03-24 MED ORDER — SODIUM CHLORIDE 0.9% FLUSH: 3.0000 mL | INTRAVENOUS | Status: DC | PRN

## 2019-03-24 MED ORDER — SODIUM CHLORIDE 0.9 % WEIGHT BASED INFUSION
1.0000 mL/kg/h | INTRAVENOUS | Status: DC
  Administered 2019-03-24 – 2019-03-25 (×2): 1 mL/kg/h via INTRAVENOUS

## 2019-03-24 NOTE — Progress Notes (Signed)
PROGRESS NOTE    Jeffery Carpenter  ZDG:[id number] DOB: 1941-01-24 DOA: 03/23/2019 PCP: [name], L.Marlou Sa, MD    Brief Narrative:  Jeffery Carpenter is a 78 y.o. male with medical history significant of hyperlipidemia and ex-smoker presented to ED with a complaint of chest pain.  Cording to patient, he woke up at around 3 AM with chest pain. Upon arrival to the emergency department, he was slightly hypertensive with blood pressure of 184/115 and slightly tachycardic at 105.  Oxygen saturation was 100% on room air.  EKG showed sinus rhythm with no acute ST-T wave changes.  Patient's chest pain was 4 out of 10 upon arrival to the ED and he was given a dose of nitroglycerin and aspirin 162 mg and he currently does not have any pain. Cardiology consulted and plan for left heart catheterization tomorrow.   Assessment & Plan:   Active Problems:   Chest pain   Dyslipidemia   ACS: Chest pain improved. Continue with aspirin, bb and lipitor.  Started the patient on IV heparin, appreciate cardiology recommendations.  Transfer to [facility] requested.  NPO after midnight. Plan for cath in am.   Stage 2 CKD: Creatinine at 1.2. continue to monitor.   Dyslipidemia:  LDL is 79.    Newly diag DM: A1C IS 6.6. Started the patient on SSI.  Plan to start him on metformin on discharge.    DVT prophylaxis: heparin.  Code Status: full code.  Family Communication: none at bedside.  Disposition Plan: pending cath.   Consultants:  Cardiology  Dr Terrence Dupont.   Procedures: none.  Antimicrobials:None.   Subjective: Chest pain improved. No sob, pnd or orthopnea, no pedal edema.   Objective: Vitals:   03/23/19 2001 03/24/19 0433 03/24/19 0434 03/24/19 1005  BP: 109/82 103/70  (!) 134/94  Pulse: 63 65  80  Resp: 18 17    Temp: (!) 97.5 F (36.4 C) 97.8 F (36.6 C)    TempSrc: Oral Oral    SpO2: 100% 96%    Weight:   84 kg   Height:        Intake/Output Summary (Last 24 hours) at 03/24/2019 1021  Last data filed at 03/24/2019 1018 Gross per 24 hour  Intake 600 ml  Output 1050 ml  Net -450 ml   Filed Weights   03/23/19 0509 03/23/19 0822 03/24/19 0434  Weight: 83.9 kg 84.4 kg 84 kg    Examination:  General exam: Appears calm and comfortable  Respiratory system: Clear to auscultation. Respiratory effort normal. Cardiovascular system: S1 & S2 heard, RRR. No JVD, murmurs,. No pedal edema. Gastrointestinal system: Abdomen is nondistended, soft and nontender. No organomegaly or masses felt. Normal bowel sounds heard. Central nervous system: Alert and oriented. No focal neurological deficits. Extremities: Symmetric 5 x 5 power. Skin: No rashes, lesions or ulcers Psychiatry:  Mood & affect appropriate.     Data Reviewed: I have personally reviewed following labs and imaging studies  CBC: Recent Labs  Lab 03/23/19 0534 03/23/19 0909 03/24/19 0814  WBC 7.9 7.5 8.8  HGB 14.1 14.3 13.4  HCT 42.4 41.7 39.2  MCV 96.8 96.8 96.8  PLT 277 262 951   Basic Metabolic Panel: Recent Labs  Lab 03/23/19 0534 03/23/19 0909  NA 139  --   K 4.1  --   CL 109  --   CO2 21*  --   GLUCOSE 177*  --   BUN 25*  --   CREATININE 1.35* 1.28*  CALCIUM 8.9  --  MG  --  2.3   GFR: Estimated Creatinine Clearance: 49 mL/min (A) (by C-G formula based on SCr of 1.28 mg/dL (H)). Liver Function Tests: No results for input(s): AST, ALT, ALKPHOS, BILITOT, PROT, ALBUMIN in the last 168 hours. No results for input(s): LIPASE, AMYLASE in the last 168 hours. No results for input(s): AMMONIA in the last 168 hours. Coagulation Profile: Recent Labs  Lab 03/23/19 1151  INR 1.1   Cardiac Enzymes: No results for input(s): CKTOTAL, CKMB, CKMBINDEX, TROPONINI in the last 168 hours. BNP (last 3 results) No results for input(s): PROBNP in the last 8760 hours. HbA1C: Recent Labs    03/23/19 0909  HGBA1C 6.6*   CBG: No results for input(s): GLUCAP in the last 168 hours. Lipid Profile: Recent  Labs    03/24/19 0814  CHOL 136  HDL 42  LDLCALC 79  TRIG 74  CHOLHDL 3.2   Thyroid Function Tests: Recent Labs    03/23/19 0909  TSH 5.279*   Anemia Panel: No results for input(s): VITAMINB12, FOLATE, FERRITIN, TIBC, IRON, RETICCTPCT in the last 72 hours. Sepsis Labs: No results for input(s): PROCALCITON, LATICACIDVEN in the last 168 hours.  Recent Results (from the past 240 hour(s))  SARS Coronavirus 2 (CEPHEID - Performed in [place] hospital lab), Hosp Order     Status: None   Collection Time: 03/23/19  6:12 AM   Specimen: Nasopharyngeal Swab  Result Value Ref Range Status   SARS Coronavirus 2 NEGATIVE NEGATIVE Final    Comment: (NOTE) If result is NEGATIVE SARS-CoV-2 target nucleic acids are NOT DETECTED. The SARS-CoV-2 RNA is generally detectable in upper and lower  respiratory specimens during the acute phase of infection. The lowest  concentration of SARS-CoV-2 viral copies this assay can detect is 250  copies / mL. A negative result does not preclude SARS-CoV-2 infection  and should not be used as the sole basis for treatment or other  patient management decisions.  A negative result may occur with  improper specimen collection / handling, submission of specimen other  than nasopharyngeal swab, presence of viral mutation(s) within the  areas targeted by this assay, and inadequate number of viral copies  (<250 copies / mL). A negative result must be combined with clinical  observations, patient history, and epidemiological information. If result is POSITIVE SARS-CoV-2 target nucleic acids are DETECTED. The SARS-CoV-2 RNA is generally detectable in upper and lower  respiratory specimens dur ing the acute phase of infection.  Positive  results are indicative of active infection with SARS-CoV-2.  Clinical  correlation with patient history and other diagnostic information is  necessary to determine patient infection status.  Positive results do  not rule out  bacterial infection or co-infection with other viruses. If result is PRESUMPTIVE POSTIVE SARS-CoV-2 nucleic acids MAY BE PRESENT.   A presumptive positive result was obtained on the submitted specimen  and confirmed on repeat testing.  While 2019 novel coronavirus  (SARS-CoV-2) nucleic acids may be present in the submitted sample  additional confirmatory testing may be necessary for epidemiological  and / or clinical management purposes  to differentiate between  SARS-CoV-2 and other Sarbecovirus currently known to infect humans.  If clinically indicated additional testing with an alternate test  methodology [phone]) is advised. The SARS-CoV-2 RNA is generally  detectable in upper and lower respiratory sp ecimens during the acute  phase of infection. The expected result is Negative. Fact Sheet for Patients:  StrictlyIdeas.no Fact Sheet for Healthcare Providers: BankingDealers.co.za This test  is not yet approved or cleared by the [place] and has been authorized for detection and/or diagnosis of SARS-CoV-2 by FDA under an Emergency Use Authorization (EUA).  This EUA will remain in effect (meaning this test can be used) for the duration of the COVID-19 declaration under Section 564(b)(1) of the Act, 21 U.S.C. section 360bbb-3(b)(1), unless the authorization is terminated or revoked sooner. Performed at [facility], [place] [address], [place], [place] [address]          Radiology Studies: Dg Chest 2 View  Result Date: 03/23/2019 CLINICAL DATA:  Initial evaluation for acute chest pressure, nausea, left arm pain. EXAM: CHEST - 2 VIEW COMPARISON:  Prior radiograph from 12/01/2005. FINDINGS: Cardiac and mediastinal silhouettes are stable in size and contour, and remain within normal limits. Aortic atherosclerosis. Lungs normally inflated. Mild left basilar subsegmental atelectasis. No focal infiltrates. No edema or  effusion. No pneumothorax. No acute osseous finding. IMPRESSION: 1. Mild left basilar subsegmental atelectasis. No other active cardiopulmonary disease. 2. Aortic atherosclerosis. Electronically Signed   By: Jeannine Boga M.D.   On: 03/23/2019 06:08        Scheduled Meds: . ALPRAZolam  0.25 mg Oral Once  . amLODipine  5 mg Oral Daily  . aspirin EC  81 mg Oral Daily  . atorvastatin  80 mg Oral q1800  . clopidogrel  75 mg Oral Daily  . folic acid  1 mg Oral Daily  . metoprolol tartrate  25 mg Oral BID  . nitroGLYCERIN  1 inch Topical Q6H  . sodium chloride flush  3 mL Intravenous Q12H   Continuous Infusions: . heparin 850 Units/hr (03/24/19 1017)     LOS: 0 days    Time spent: 31 minutes.     Hosie Poisson, MD [facility] Pager [phone]   If 7PM-7AM, please contact night-coverage [email or web address] Password [facility] 03/24/2019, 10:21 AM

## 2019-03-24 NOTE — H&P (View-Only) (Signed)
Subjective:  No further chest pain or arm pain.  Doing better this morning.  Troponin I is trending down.  EKG shows no acute ischemic changes  Objective:  Vital Signs in the last 24 hours: Temp:  [97.5 F (36.4 C)-98.2 F (36.8 C)] 97.8 F (36.6 C) (07/19 0433) Pulse Rate:  [63-98] 65 (07/19 0433) Resp:  [16-18] 17 (07/19 0433) BP: (103-180)/(70-115) 103/70 (07/19 0433) SpO2:  [96 %-100 %] 96 % (07/19 0433) Weight:  [84 kg] 84 kg (07/19 0434)  Intake/Output from previous day: 07/18 0701 - 07/19 0700 In: 600 [P.O.:600] Out: 1250 [Urine:1250] Intake/Output from this shift: No intake/output data recorded.  Physical Exam: Neck: no adenopathy, no carotid bruit, no JVD and supple, symmetrical, trachea midline Lungs: clear to auscultation bilaterally Heart: regular rate and rhythm, S1, S2 normal and no S3 or S4 Abdomen: soft, non-tender; bowel sounds normal; no masses,  no organomegaly Extremities: extremities normal, atraumatic, no cyanosis or edema  Lab Results: Recent Labs    03/23/19 0909 03/24/19 0814  WBC 7.5 8.8  HGB 14.3 13.4  PLT 262 238   Recent Labs    03/23/19 0534 03/23/19 0909  NA 139  --   K 4.1  --   CL 109  --   CO2 21*  --   GLUCOSE 177*  --   BUN 25*  --   CREATININE 1.35* 1.28*   No results for input(s): TROPONINI in the last 72 hours.  Invalid input(s): CK, MB Hepatic Function Panel No results for input(s): PROT, ALBUMIN, AST, ALT, ALKPHOS, BILITOT, BILIDIR, IBILI in the last 72 hours. No results for input(s): CHOL in the last 72 hours. No results for input(s): PROTIME in the last 72 hours.  Imaging: Imaging results have been reviewed and Dg Chest 2 View  Result Date: 03/23/2019 CLINICAL DATA:  Initial evaluation for acute chest pressure, nausea, left arm pain. EXAM: CHEST - 2 VIEW COMPARISON:  Prior radiograph from 12/01/2005. FINDINGS: Cardiac and mediastinal silhouettes are stable in size and contour, and remain within normal limits.  Aortic atherosclerosis. Lungs normally inflated. Mild left basilar subsegmental atelectasis. No focal infiltrates. No edema or effusion. No pneumothorax. No acute osseous finding. IMPRESSION: 1. Mild left basilar subsegmental atelectasis. No other active cardiopulmonary disease. 2. Aortic atherosclerosis. Electronically Signed   By: Jeannine Boga M.D.   On: 03/23/2019 06:08    Cardiac Studies:  Assessment/Plan:  Acute coronary syndrome New onset hypertension New onset diabetes mellitus Hyperlipidemia Remote tobacco abuse History of hemochromatosis Elevated TSH rule out hypothyroidism Plan Continue present management Discussed with patient again regarding left cath possible PTCA stenting its risk and benefits and consents for PCI. Please transfer patient to [facility] if possible. Keep n.p.o. after midnight  LOS: 0 days    [name] 03/24/2019, 9:15 AM

## 2019-03-24 NOTE — Progress Notes (Addendum)
Pt arrived to unit, hep gtt at 8.5cc/hr, no c/o of chest pain.

## 2019-03-24 NOTE — Progress Notes (Signed)
Pt to be transferred to [place] as ordered for cardiac cath tomorrow 07/20. Pt aware of transfer, wife also aware. Report called to RN Saddie Benders prior to transfer. [facility] here now to transfer pt. Pt denying any pain, VSS. NSR on telemetry. Heparin infusing at 8.5 ml/hr. All belongings to be sent with pt.

## 2019-03-24 NOTE — Progress Notes (Signed)
ANTICOAGULATION CONSULT NOTE - Initial Consult  Pharmacy Consult for heparin Indication: chest pain/ACS  Allergies  Allergen Reactions  . Bee Venom Anaphylaxis  . Chocolate Other (See Comments)    Causes migraines.  . Cucumber Extract Other (See Comments)    Cucumbers cause migraine headaches  . Onion Other (See Comments)    Onions cause migraine headaches  . Penicillins Other (See Comments)    "just not effective" Did it involve swelling of the face/tongue/throat, SOB, or low BP? No Did it involve sudden or severe rash/hives, skin peeling, or any reaction on the inside of your mouth or nose? No Did you need to seek medical attention at a hospital or doctor's office? No When did it last happen? If all above answers are "NO", may proceed with cephalosporin use.     Patient Measurements: Height: 5' 7.5" (171.5 cm) Weight: 185 lb 3.2 oz (84 kg) IBW/kg (Calculated) : 67.25 Heparin Dosing Weight: 83 kg  Vital Signs: Temp: 97.8 F (36.6 C) (07/19 0433) Temp Source: Oral (07/19 0433) BP: 103/70 (07/19 0433) Pulse Rate: 65 (07/19 0433)  Labs: Recent Labs    03/23/19 0534 03/23/19 0707 03/23/19 0909 03/23/19 1015 03/23/19 1151 03/23/19 2103 03/24/19 0814  HGB 14.1  --  14.3  --   --   --  13.4  HCT 42.4  --  41.7  --   --   --  39.2  PLT 277  --  262  --   --   --  238  APTT  --   --   --   --  30  --   --   LABPROT  --   --   --   --  13.9  --   --   INR  --   --   --   --  1.1  --   --   HEPARINUNFRC  --   --   --   --   --  0.75* 0.75*  CREATININE 1.35*  --  1.28*  --   --   --   --   TROPONINIHS 23* 72* 121* 101*  --   --   --     Estimated Creatinine Clearance: 49 mL/min (A) (by C-G formula based on SCr of 1.28 mg/dL (H)).   Medical History: Past Medical History:  Diagnosis Date  . 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 Hemochromatosis   . HTN (hypertension), benign 02/06/2014   pt takes inderal for pre competition calmness  . 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 Other and unspecified hyperlipidemia 02/06/2014   Assessment: Patient admitted with complaints of chest pain. Pharmacy consulted to dose/monitor heparin drip for ACS/STEMI protocol. Patient is not taking anticoagulants PTA per med rec and insurance fill history. Planning for transfer to [facility] for [facility] cath on Monday.  Today, 03/24/19  Hgb 13.4 - WNL  Plt - WNL, stable  SCr 1.3, CrCl ~ 50 mL/min  HL = 0.75 remains slightly elevated on heparin infusion of 950 units/hr  Confirmed with RN that heparin infusing at correct rate. No issues with infusion. No signs/symptoms of bleeding.  Goal of Therapy:  Heparin level 0.3-0.7 units/ml Monitor platelets by anticoagulation protocol: Yes   Plan:   Decrease heparin infusion to 850 units/hr  Check HL in 8 hours  CBC and HL daily while on heparin infusion  Lenis Noon, PharmD 03/24/2019,9:50 AM

## 2019-03-24 NOTE — Progress Notes (Signed)
[place] for heparin Indication: chest pain/ACS  Allergies  Allergen Reactions  . Bee Venom Anaphylaxis  . Chocolate Other (See Comments)    Causes migraines.  . Cucumber Extract Other (See Comments)    Cucumbers cause migraine headaches  . Onion Other (See Comments)    Onions cause migraine headaches  . Orange Fruit [Citrus]   . Penicillins Other (See Comments)    "just not effective" Did it involve swelling of the face/tongue/throat, SOB, or low BP? No Did it involve sudden or severe rash/hives, skin peeling, or any reaction on the inside of your mouth or nose? No Did you need to seek medical attention at a hospital or doctor's office? No When did it last happen? If all above answers are "NO", may proceed with cephalosporin use.     Patient Measurements: Height: 5' 7.5" (171.5 cm) Weight: 185 lb 3.2 oz (84 kg) IBW/kg (Calculated) : 67.25 Heparin Dosing Weight: 83 kg  Vital Signs: Temp: 97.6 F (36.4 C) (07/19 1518) Temp Source: Oral (07/19 1518) BP: 123/99 (07/19 1748) Pulse Rate: 65 (07/19 1320)  Labs: Recent Labs    03/23/19 0534 03/23/19 0707 03/23/19 0909 03/23/19 1015 03/23/19 1151 03/23/19 2103 03/24/19 0814 03/24/19 1022 03/24/19 1805  HGB 14.1  --  14.3  --   --   --  13.4 13.9  --   HCT 42.4  --  41.7  --   --   --  39.2 41.0  --   PLT 277  --  262  --   --   --  238 265  --   APTT  --   --   --   --  30  --   --   --   --   LABPROT  --   --   --   --  13.9  --   --   --   --   INR  --   --   --   --  1.1  --   --   --   --   HEPARINUNFRC  --   --   --   --   --  0.75* 0.75*  --  0.52  CREATININE 1.35*  --  1.28*  --   --   --   --  1.29*  --   TROPONINIHS 23* 72* 121* 101*  --   --   --   --   --     Estimated Creatinine Clearance: 48.6 mL/min (A) (by C-G formula based on SCr of 1.29 mg/dL (H)).   Medical History: Past Medical History:  Diagnosis Date  . Allergic reaction to bee sting  02/02/2019  . Hemochromatosis   . HTN (hypertension), benign 02/06/2014   pt takes inderal for pre competition calmness  . 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 Other and unspecified hyperlipidemia 02/06/2014   Assessment: 78 yo male admitted with complaints of chest pain. Pharmacy consulted to dose heparin for ACS. Patient is not taking anticoagulants PTA per med rec and insurance fill history. Heparin level 0.52 therapeutic on heparin 850 units/hr. CBC stable. No reported bleeding. Planning for cardiac cath 7/20.  Goal of Therapy:  Heparin level 0.3-0.7 units/ml Monitor platelets by anticoagulation protocol: Yes   Plan:  Continue heparin 850 units/hr Check heparin level with morning labs 7/20 Monitor heparin level, CBC, and S/S of bleeding daily    Cristela Felt, PharmD PGY1 Pharmacy  Resident [employer]: [phone] 03/24/2019,6:42 PM

## 2019-03-24 NOTE — Progress Notes (Signed)
Subjective:  No further chest pain or arm pain.  Doing better this morning.  Troponin I is trending down.  EKG shows no acute ischemic changes  Objective:  Vital Signs in the last 24 hours: Temp:  [97.5 F (36.4 C)-98.2 F (36.8 C)] 97.8 F (36.6 C) (07/19 0433) Pulse Rate:  [63-98] 65 (07/19 0433) Resp:  [16-18] 17 (07/19 0433) BP: (103-180)/(70-115) 103/70 (07/19 0433) SpO2:  [96 %-100 %] 96 % (07/19 0433) Weight:  [84 kg] 84 kg (07/19 0434)  Intake/Output from previous day: 07/18 0701 - 07/19 0700 In: 600 [P.O.:600] Out: 1250 [Urine:1250] Intake/Output from this shift: No intake/output data recorded.  Physical Exam: Neck: no adenopathy, no carotid bruit, no JVD and supple, symmetrical, trachea midline Lungs: clear to auscultation bilaterally Heart: regular rate and rhythm, S1, S2 normal and no S3 or S4 Abdomen: soft, non-tender; bowel sounds normal; no masses,  no organomegaly Extremities: extremities normal, atraumatic, no cyanosis or edema  Lab Results: Recent Labs    03/23/19 0909 03/24/19 0814  WBC 7.5 8.8  HGB 14.3 13.4  PLT 262 238   Recent Labs    03/23/19 0534 03/23/19 0909  NA 139  --   K 4.1  --   CL 109  --   CO2 21*  --   GLUCOSE 177*  --   BUN 25*  --   CREATININE 1.35* 1.28*   No results for input(s): TROPONINI in the last 72 hours.  Invalid input(s): CK, MB Hepatic Function Panel No results for input(s): PROT, ALBUMIN, AST, ALT, ALKPHOS, BILITOT, BILIDIR, IBILI in the last 72 hours. No results for input(s): CHOL in the last 72 hours. No results for input(s): PROTIME in the last 72 hours.  Imaging: Imaging results have been reviewed and Dg Chest 2 View  Result Date: 03/23/2019 CLINICAL DATA:  Initial evaluation for acute chest pressure, nausea, left arm pain. EXAM: CHEST - 2 VIEW COMPARISON:  Prior radiograph from 12/01/2005. FINDINGS: Cardiac and mediastinal silhouettes are stable in size and contour, and remain within normal limits.  Aortic atherosclerosis. Lungs normally inflated. Mild left basilar subsegmental atelectasis. No focal infiltrates. No edema or effusion. No pneumothorax. No acute osseous finding. IMPRESSION: 1. Mild left basilar subsegmental atelectasis. No other active cardiopulmonary disease. 2. Aortic atherosclerosis. Electronically Signed   By: Jeannine Boga M.D.   On: 03/23/2019 06:08    Cardiac Studies:  Assessment/Plan:  Acute coronary syndrome New onset hypertension New onset diabetes mellitus Hyperlipidemia Remote tobacco abuse History of hemochromatosis Elevated TSH rule out hypothyroidism Plan Continue present management Discussed with patient again regarding left cath possible PTCA stenting its risk and benefits and consents for PCI. Please transfer patient to [facility] if possible. Keep n.p.o. after midnight  LOS: 0 days    [name] 03/24/2019, 9:15 AM

## 2019-03-25 ENCOUNTER — Encounter (HOSPITAL_COMMUNITY)
Admission: EM | Disposition: A | Discharge status: Home / Self Care | Payer: Self-pay | Source: Home / Self Care | Attending: Student

## 2019-03-25 DIAGNOSIS — I249 Acute ischemic heart disease, unspecified: Secondary | ICD-10-CM

## 2019-03-25 DIAGNOSIS — I200 Unstable angina: Secondary | ICD-10-CM

## 2019-03-25 DIAGNOSIS — I10 Essential (primary) hypertension: Secondary | ICD-10-CM

## 2019-03-25 DIAGNOSIS — E1169 Type 2 diabetes mellitus with other specified complication: Secondary | ICD-10-CM

## 2019-03-25 HISTORY — PX: LEFT HEART CATH AND CORONARY ANGIOGRAPHY: CATH118249

## 2019-03-25 HISTORY — PX: CORONARY STENT INTERVENTION: CATH118234

## 2019-03-25 LAB — BASIC METABOLIC PANEL
Anion gap: 8 (ref 5–15)
BUN: 20 mg/dL (ref 8–23)
CO2: 23 mmol/L (ref 22–32)
Calcium: 8.8 mg/dL — ABNORMAL LOW (ref 8.9–10.3)
Chloride: 108 mmol/L (ref 98–111)
Creatinine, Ser: 1.37 mg/dL — ABNORMAL HIGH (ref 0.61–1.24)
GFR calc Af Amer: 56 mL/min — ABNORMAL LOW (ref >60)
GFR calc non Af Amer: 49 mL/min — ABNORMAL LOW (ref >60)
Glucose, Bld: 140 mg/dL — ABNORMAL HIGH (ref 70–99)
Potassium: 4.2 mmol/L (ref 3.5–5.1)
Sodium: 139 mmol/L (ref 135–145)

## 2019-03-25 LAB — POCT ACTIVATED CLOTTING TIME
Activated Clotting Time: 378 seconds
Activated Clotting Time: 197 seconds
Activated Clotting Time: 175 seconds

## 2019-03-25 LAB — CBC
HCT: 37.6 % — ABNORMAL LOW (ref 39.0–52.0)
Hemoglobin: 13.1 g/dL (ref 13.0–17.0)
MCH: 32.9 pg (ref 26.0–34.0)
MCHC: 34.8 g/dL (ref 30.0–36.0)
MCV: 94.5 fL (ref 80.0–100.0)
Platelets: 251 10*3/uL (ref 150–400)
RBC: 3.98 MIL/uL — ABNORMAL LOW (ref 4.22–5.81)
RDW: 12.1 % (ref 11.5–15.5)
WBC: 7.8 10*3/uL (ref 4.0–10.5)
nRBC: 0 % (ref 0.0–0.2)

## 2019-03-25 LAB — HEPARIN LEVEL (UNFRACTIONATED): Heparin Unfractionated: 0.42 IU/mL (ref 0.30–0.70)

## 2019-03-25 LAB — GLUCOSE, CAPILLARY
Glucose-Capillary: 129 mg/dL — ABNORMAL HIGH (ref 70–99)
Glucose-Capillary: 94 mg/dL (ref 70–99)
Glucose-Capillary: 163 mg/dL — ABNORMAL HIGH (ref 70–99)

## 2019-03-25 SURGERY — LEFT HEART CATH AND CORONARY ANGIOGRAPHY
Anesthesia: LOCAL

## 2019-03-25 MED ORDER — CLOPIDOGREL BISULFATE 300 MG PO TABS
ORAL_TABLET | ORAL | Status: DC | PRN
  Administered 2019-03-25: 300 mg via ORAL

## 2019-03-25 MED ORDER — BIVALIRUDIN BOLUS VIA INFUSION - CUPID
INTRAVENOUS | Status: DC | PRN
  Administered 2019-03-25: 13:00:00 61.65 mg via INTRAVENOUS

## 2019-03-25 MED ORDER — FENTANYL CITRATE (PF) 100 MCG/2ML IJ SOLN
INTRAMUSCULAR | Status: DC | PRN
  Administered 2019-03-25: 25 ug via INTRAVENOUS

## 2019-03-25 MED ORDER — IOHEXOL 350 MG/ML SOLN
INTRAVENOUS | Status: DC | PRN
  Administered 2019-03-25: 120 mL via INTRACARDIAC
  Administered 2019-03-25: 60 mL via INTRACARDIAC

## 2019-03-25 MED ORDER — NITROGLYCERIN 1 MG/10 ML FOR IR/CATH LAB
INTRA_ARTERIAL | Status: AC
  Filled 2019-03-25: qty 10

## 2019-03-25 MED ORDER — HEPARIN (PORCINE) IN NACL 1000-0.9 UT/500ML-% IV SOLN
INTRAVENOUS | Status: DC | PRN
  Administered 2019-03-25 (×2): 500 mL

## 2019-03-25 MED ORDER — SODIUM CHLORIDE 0.9 % IV SOLN: 250.0000 mL | INTRAVENOUS | Status: DC | PRN

## 2019-03-25 MED ORDER — SODIUM CHLORIDE 0.9 % IV SOLN
INTRAVENOUS | Status: DC | PRN
  Administered 2019-03-25: 1.75 mg/kg/h via INTRAVENOUS

## 2019-03-25 MED ORDER — HEPARIN (PORCINE) IN NACL 1000-0.9 UT/500ML-% IV SOLN
INTRAVENOUS | Status: AC
  Filled 2019-03-25: qty 1000

## 2019-03-25 MED ORDER — SODIUM CHLORIDE 0.9 % WEIGHT BASED INFUSION
1.0000 mL/kg/h | INTRAVENOUS | Status: AC
  Administered 2019-03-25: 1 mL/kg/h via INTRAVENOUS

## 2019-03-25 MED ORDER — ONDANSETRON HCL 4 MG/2ML IJ SOLN: 4.0000 mg | Freq: Four times a day (QID) | INTRAMUSCULAR | Status: DC | PRN

## 2019-03-25 MED ORDER — LABETALOL HCL 5 MG/ML IV SOLN: 10.0000 mg | INTRAVENOUS | Status: AC | PRN

## 2019-03-25 MED ORDER — HEPARIN (PORCINE) IN NACL 1000-0.9 UT/500ML-% IV SOLN
INTRAVENOUS | Status: DC | PRN
  Administered 2019-03-25: 500 mL

## 2019-03-25 MED ORDER — LIDOCAINE HCL (PF) 1 % IJ SOLN
INTRAMUSCULAR | Status: DC | PRN
  Administered 2019-03-25: 30 mL

## 2019-03-25 MED ORDER — OXYCODONE HCL 5 MG PO TABS: 5.0000 mg | ORAL_TABLET | ORAL | Status: DC | PRN

## 2019-03-25 MED ORDER — CLOPIDOGREL BISULFATE 75 MG PO TABS
75.0000 mg | ORAL_TABLET | Freq: Every day | ORAL | Status: DC
  Administered 2019-03-26: 10:00:00 75 mg via ORAL
  Filled 2019-03-25: qty 1

## 2019-03-25 MED ORDER — MIDAZOLAM HCL 2 MG/2ML IJ SOLN
INTRAMUSCULAR | Status: AC
  Filled 2019-03-25: qty 2

## 2019-03-25 MED ORDER — CLOPIDOGREL BISULFATE 300 MG PO TABS
ORAL_TABLET | ORAL | Status: AC
  Filled 2019-03-25: qty 1

## 2019-03-25 MED ORDER — SODIUM CHLORIDE 0.9% FLUSH
3.0000 mL | Freq: Two times a day (BID) | INTRAVENOUS | Status: DC
  Administered 2019-03-25: 3 mL via INTRAVENOUS

## 2019-03-25 MED ORDER — MIDAZOLAM HCL 2 MG/2ML IJ SOLN
INTRAMUSCULAR | Status: DC | PRN
  Administered 2019-03-25: 1 mg via INTRAVENOUS

## 2019-03-25 MED ORDER — HYDRALAZINE HCL 20 MG/ML IJ SOLN: 10.0000 mg | INTRAMUSCULAR | Status: AC | PRN

## 2019-03-25 MED ORDER — NITROGLYCERIN 1 MG/10 ML FOR IR/CATH LAB
INTRA_ARTERIAL | Status: DC | PRN
  Administered 2019-03-25: 200 ug

## 2019-03-25 MED ORDER — SODIUM CHLORIDE 0.9% FLUSH: 3.0000 mL | INTRAVENOUS | Status: DC | PRN

## 2019-03-25 MED ORDER — BIVALIRUDIN TRIFLUOROACETATE 250 MG IV SOLR
INTRAVENOUS | Status: AC
  Filled 2019-03-25: qty 250

## 2019-03-25 MED ORDER — HEPARIN SODIUM (PORCINE) 5000 UNIT/ML IJ SOLN
5000.0000 [IU] | Freq: Three times a day (TID) | INTRAMUSCULAR | Status: DC
  Administered 2019-03-25 – 2019-03-26 (×2): 5000 [IU] via SUBCUTANEOUS
  Filled 2019-03-25 (×2): qty 1

## 2019-03-25 MED ORDER — LIDOCAINE HCL (PF) 1 % IJ SOLN
INTRAMUSCULAR | Status: AC
  Filled 2019-03-25: qty 30

## 2019-03-25 MED ORDER — ASPIRIN 81 MG PO CHEW
81.0000 mg | CHEWABLE_TABLET | Freq: Every day | ORAL | Status: DC
  Administered 2019-03-26: 10:00:00 81 mg via ORAL
  Filled 2019-03-25: qty 1

## 2019-03-25 MED ORDER — ACETAMINOPHEN 325 MG PO TABS: 650.0000 mg | ORAL_TABLET | ORAL | Status: DC | PRN

## 2019-03-25 MED ORDER — FENTANYL CITRATE (PF) 100 MCG/2ML IJ SOLN
INTRAMUSCULAR | Status: AC
  Filled 2019-03-25: qty 2

## 2019-03-25 SURGICAL SUPPLY — 18 items
BAG SNAP BAND KOVER 36X36 (MISCELLANEOUS) ×1 IMPLANT
BALLN SAPPHIRE 2.5X12 (BALLOONS) ×2
BALLN SAPPHIRE ~~LOC~~ 3.0X10 (BALLOONS) ×1 IMPLANT
BALLOON SAPPHIRE 2.5X12 (BALLOONS) IMPLANT
CATH INFINITI 5FR MULTPACK ANG (CATHETERS) ×1 IMPLANT
CATH VISTA GUIDE 6FR XB3.5 (CATHETERS) ×1 IMPLANT
COVER DOME SNAP 22 D (MISCELLANEOUS) ×1 IMPLANT
KIT ENCORE 26 ADVANTAGE (KITS) ×1 IMPLANT
KIT HEART LEFT (KITS) ×2 IMPLANT
PACK CARDIAC CATHETERIZATION (CUSTOM PROCEDURE TRAY) ×2 IMPLANT
SHEATH PINNACLE 5F 10CM (SHEATH) ×1 IMPLANT
SHEATH PINNACLE 6F 10CM (SHEATH) ×1 IMPLANT
STENT RESOLUTE ONYX 2.5X18 (Permanent Stent) ×1 IMPLANT
STENT RESOLUTE ONYX 2.75X12 (Permanent Stent) ×1 IMPLANT
SYR MEDRAD MARK 7 150ML (SYRINGE) ×2 IMPLANT
TRANSDUCER W/STOPCOCK (MISCELLANEOUS) ×2 IMPLANT
WIRE ASAHI PROWATER 180CM (WIRE) ×1 IMPLANT
WIRE EMERALD 3MM-J .035X150CM (WIRE) ×1 IMPLANT

## 2019-03-25 NOTE — Progress Notes (Signed)
PROGRESS NOTE  Jeffery Carpenter GQQ:[id number] DOB: 22-Dec-1940   PCP: [name], L.Marlou Sa, MD  Patient is from: Home  DOA: 03/23/2019 LOS: 1  Brief Narrative / Interim history: 78 y.o.malewith history of HLD and ex-smoker presenting with chest pain.  In ED, hypertensive and slightly tachycardic.  EKG without acute ischemic changes.  Troponin trended from 71-121-101. Patient had left heart catheterization and stent placement on 03/25/2019.   Subjective: No major events overnight of this morning.  Denies chest pain, dyspnea or palpitation.  No GI or GU symptoms.  Walking in the room.   Objective: Vitals:   03/25/19 1445 03/25/19 1450 03/25/19 1500 03/25/19 1600  BP: 140/90 126/66 (!) 146/97 (!) 144/102  Pulse: 77 69 66 62  Resp: 16 17 17 11   Temp:      TempSrc:      SpO2: 98% 97% 98% 97%  Weight:      Height:        Intake/Output Summary (Last 24 hours) at 03/25/2019 1647 Last data filed at 03/25/2019 1400 Gross per 24 hour  Intake 243 ml  Output 1950 ml  Net -1707 ml   Filed Weights   03/23/19 0822 03/24/19 0434 03/25/19 0606  Weight: 84.4 kg 84 kg 82.2 kg    Examination:  GENERAL: No acute distress.  Appears well.  HEENT: MMM.  Vision and hearing grossly intact.  NECK: Supple.  No apparent JVD.  RESP:  No IWOB. Good air movement bilaterally. CVS:  RRR. Heart sounds normal.  ABD/GI/GU: Bowel sounds present. Soft. Non tender.  MSK/EXT:  Moves extremities. No apparent deformity or edema.  SKIN: no apparent skin lesion or wound NEURO: Awake, alert and oriented appropriately.  No gross deficit.  PSYCH: Calm. Normal affect.    I have personally reviewed the following labs and images:  Radiology Studies: No results found.  Microbiology: Recent Results (from the past 240 hour(s))  SARS Coronavirus 2 (CEPHEID - Performed in [place] hospital lab), Hosp Order     Status: None   Collection Time: 03/23/19  6:12 AM   Specimen: Nasopharyngeal Swab  Result Value Ref  Range Status   SARS Coronavirus 2 NEGATIVE NEGATIVE Final    Comment: (NOTE) If result is NEGATIVE SARS-CoV-2 target nucleic acids are NOT DETECTED. The SARS-CoV-2 RNA is generally detectable in upper and lower  respiratory specimens during the acute phase of infection. The lowest  concentration of SARS-CoV-2 viral copies this assay can detect is 250  copies / mL. A negative result does not preclude SARS-CoV-2 infection  and should not be used as the sole basis for treatment or other  patient management decisions.  A negative result may occur with  improper specimen collection / handling, submission of specimen other  than nasopharyngeal swab, presence of viral mutation(s) within the  areas targeted by this assay, and inadequate number of viral copies  (<250 copies / mL). A negative result must be combined with clinical  observations, patient history, and epidemiological information. If result is POSITIVE SARS-CoV-2 target nucleic acids are DETECTED. The SARS-CoV-2 RNA is generally detectable in upper and lower  respiratory specimens dur ing the acute phase of infection.  Positive  results are indicative of active infection with SARS-CoV-2.  Clinical  correlation with patient history and other diagnostic information is  necessary to determine patient infection status.  Positive results do  not rule out bacterial infection or co-infection with other viruses. If result is PRESUMPTIVE POSTIVE SARS-CoV-2 nucleic acids MAY BE PRESENT.  A presumptive positive result was obtained on the submitted specimen  and confirmed on repeat testing.  While 2019 novel coronavirus  (SARS-CoV-2) nucleic acids may be present in the submitted sample  additional confirmatory testing may be necessary for epidemiological  and / or clinical management purposes  to differentiate between  SARS-CoV-2 and other Sarbecovirus currently known to infect humans.  If clinically indicated additional testing with an  alternate test  methodology [phone]) is advised. The SARS-CoV-2 RNA is generally  detectable in upper and lower respiratory sp ecimens during the acute  phase of infection. The expected result is Negative. Fact Sheet for Patients:  StrictlyIdeas.no Fact Sheet for Healthcare Providers: BankingDealers.co.za This test is not yet approved or cleared by the [place] FDA and has been authorized for detection and/or diagnosis of SARS-CoV-2 by FDA under an Emergency Use Authorization (EUA).  This EUA will remain in effect (meaning this test can be used) for the duration of the COVID-19 declaration under Section 564(b)(1) of the Act, 21 U.S.C. section 360bbb-3(b)(1), unless the authorization is terminated or revoked sooner. Performed at [facility], [place] [address], [place], [place] [address]     Sepsis Labs: Invalid input(s): PROCALCITONIN, LACTICIDVEN  Urine analysis: No results found for: COLORURINE, APPEARANCEUR, LABSPEC, PHURINE, GLUCOSEU, HGBUR, BILIRUBINUR, KETONESUR, PROTEINUR, UROBILINOGEN, NITRITE, LEUKOCYTESUR  Anemia Panel: No results for input(s): VITAMINB12, FOLATE, FERRITIN, TIBC, IRON, RETICCTPCT in the last 72 hours.  Thyroid Function Tests: Recent Labs    03/23/19 0909  TSH 5.279*    Lipid Profile: Recent Labs    03/24/19 0814  CHOL 136  HDL 42  LDLCALC 79  TRIG 74  CHOLHDL 3.2    CBG: Recent Labs  Lab 03/24/19 1725 03/24/19 2145 03/25/19 0742  GLUCAP 131* 125* 129*    HbA1C: Recent Labs    03/23/19 0909  HGBA1C 6.6*    BNP (last 3 results): No results for input(s): PROBNP in the last 8760 hours.  Cardiac Enzymes: No results for input(s): CKTOTAL, CKMB, CKMBINDEX, TROPONINI in the last 168 hours.  Coagulation Profile: Recent Labs  Lab 03/23/19 1151  INR 1.1    Liver Function Tests: No results for input(s): AST, ALT, ALKPHOS, BILITOT, PROT, ALBUMIN in the last  168 hours. No results for input(s): LIPASE, AMYLASE in the last 168 hours. No results for input(s): AMMONIA in the last 168 hours.  Basic Metabolic Panel: Recent Labs  Lab 03/23/19 0534 03/23/19 0909 03/24/19 1022 03/25/19 0347  NA 139  --  140 139  K 4.1  --  4.7 4.2  CL 109  --  107 108  CO2 21*  --  24 23  GLUCOSE 177*  --  175* 140*  BUN 25*  --  22 20  CREATININE 1.35* 1.28* 1.29* 1.37*  CALCIUM 8.9  --  9.3 8.8*  MG  --  2.3  --   --    GFR: Estimated Creatinine Clearance: 45.3 mL/min (A) (by C-G formula based on SCr of 1.37 mg/dL (H)).  CBC: Recent Labs  Lab 03/23/19 0534 03/23/19 0909 03/24/19 0814 03/24/19 1022 03/25/19 0347  WBC 7.9 7.5 8.8 9.9 7.8  NEUTROABS  --   --   --  5.6  --   HGB 14.1 14.3 13.4 13.9 13.1  HCT 42.4 41.7 39.2 41.0 37.6*  MCV 96.8 96.8 96.8 97.4 94.5  PLT 277 262 238 265 251    Procedures:  03/25/19: Left heart catheterization and stent placement  Microbiology summarized: COVID-19 negative  Assessment & Plan: ACS: EKG without acute ischemic finding.  Mild troponin elevation. -Status post LHC with stent placement on 03/25/2019. -DAPT, metoprolol per cardiology -Atorvastatin 80 mg daily  New diagnosis of hypertension: -Continue metoprolol amlodipine  CKD-2: Stable -Monitor  New diagnosis of DM-2/hyperlipidemia: A1c 6.6%.  LDL 79. -CBG monitoring and SSI -We will restart metformin on discharge -Statin as above  Anxiety: -Continue home Klonopin  DVT prophylaxis: Subcu heparin Code Status: Full code Family Communication: Patient and/or RN. Available if any question.  Disposition Plan: Remains inpatient.  And spent discharge in the next 24-hour once cleared by cardiology. Consultants: Cardiology   Antimicrobials: Anti-infectives (From admission, onward)   None      Sch Meds:  Scheduled Meds: . ALPRAZolam  0.25 mg Oral Once  . amLODipine  5 mg Oral Daily  . aspirin EC  81 mg Oral Daily  . atorvastatin  80 mg  Oral q1800  . clopidogrel  75 mg Oral Daily  . folic acid  1 mg Oral Daily  . insulin aspart  0-9 Units Subcutaneous TID WC  . metoprolol tartrate  25 mg Oral BID  . nitroGLYCERIN  1 inch Topical Q6H  . sodium chloride flush  3 mL Intravenous Q12H   Continuous Infusions: . sodium chloride    . heparin 850 Units/hr (03/24/19 1017)   PRN Meds:.acetaminophen, clonazePAM, nitroGLYCERIN, ondansetron (ZOFRAN) IV   T. [place]  If 7PM-7AM, please contact night-coverage [email or web address] Password [facility] 03/25/2019, 4:47 PM

## 2019-03-25 NOTE — Progress Notes (Signed)
Site area: Right groin a 6 french arterial sheath was removed  Site Prior to Removal:  Level 0  Pressure Applied For 20 MINUTES    Bedrest Beginning at 1625p  Manual:   Yes.    Patient Status During Pull:    Post Pull Groin Site:  Level 0  Post Pull Instructions Given:  Yes.    Post Pull Pulses Present:  Yes.    Dressing Applied:  Yes.    Comments:  VS remain stable

## 2019-03-25 NOTE — CV Procedure (Signed)
   Overlapping stents in circumflex from ostium to mid vessel reducing 99% stenosis to 0%.  There was dissection distal to the stenosis that was covered.  TIMI grade III flow ultimately with stent segment dilated to 3.0 mm in diameter.  Discontinue Angiomax.  Aspirin and Plavix

## 2019-03-25 NOTE — Progress Notes (Addendum)
[place] for heparin Indication: chest pain/ACS  Allergies  Allergen Reactions  . Bee Venom Anaphylaxis  . Chocolate Other (See Comments)    Causes migraines.  . Cucumber Extract Other (See Comments)    Cucumbers cause migraine headaches  . Onion Other (See Comments)    Onions cause migraine headaches  . Orange Fruit [Citrus]   . Penicillins Other (See Comments)    "just not effective" Did it involve swelling of the face/tongue/throat, SOB, or low BP? No Did it involve sudden or severe rash/hives, skin peeling, or any reaction on the inside of your mouth or nose? No Did you need to seek medical attention at a hospital or doctor's office? No When did it last happen? If all above answers are "NO", may proceed with cephalosporin use.     Patient Measurements: Height: 5' 7.5" (171.5 cm) Weight: 181 lb 4.8 oz (82.2 kg) IBW/kg (Calculated) : 67.25 Heparin Dosing Weight: 83 kg  Vital Signs: Temp: 97.7 F (36.5 C) (07/20 0606) Temp Source: Oral (07/20 0606) BP: 136/98 (07/20 0606) Pulse Rate: 70 (07/20 0606)  Labs: Recent Labs    03/23/19 0707 03/23/19 0909 03/23/19 1015 03/23/19 1151  03/24/19 0814 03/24/19 1022 03/24/19 1805 03/25/19 0347  HGB  --  14.3  --   --   --  13.4 13.9  --  13.1  HCT  --  41.7  --   --   --  39.2 41.0  --  37.6*  PLT  --  262  --   --   --  238 265  --  251  APTT  --   --   --  30  --   --   --   --   --   LABPROT  --   --   --  13.9  --   --   --   --   --   INR  --   --   --  1.1  --   --   --   --   --   HEPARINUNFRC  --   --   --   --    < > 0.75*  --  0.52 0.42  CREATININE  --  1.28*  --   --   --   --  1.29*  --  1.37*  TROPONINIHS 72* 121* 101*  --   --   --   --   --   --    < > = values in this interval not displayed.    Estimated Creatinine Clearance: 45.3 mL/min (A) (by C-G formula based on SCr of 1.37 mg/dL (H)).   Medical History: Past Medical History:  Diagnosis Date  .  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 Hemochromatosis   . HTN (hypertension), benign 02/06/2014   pt takes inderal for pre competition calmness  . 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 Other and unspecified hyperlipidemia 02/06/2014   Assessment: 78 yo male admitted with complaints of chest pain. Pharmacy consulted to dose heparin for ACS. Patient is not taking anticoagulants PTA per med rec and insurance fill history. Heparin level 0.42 therapeutic on heparin 850 units/hr. CBC stable. No reported bleeding. Planning for cardiac cath 7/20.  Goal of Therapy:  Heparin level 0.3-0.7 units/ml Monitor platelets by anticoagulation protocol: Yes   Plan:  Continue heparin 850 units/hr Monitor heparin level daily, CBC, and S/S of bleeding daily  F/u post-cath 7/20  Acey Lav, PharmD  PGY1 Pharmacy Resident [employer] [phone] 03/25/2019,9:05 AM

## 2019-03-25 NOTE — Interval H&P Note (Signed)
  Cath Lab Visit (complete for each Cath Lab visit)  Clinical Evaluation Leading to the Procedure:   ACS: Yes.    Non-ACS:    Anginal Classification: CCS IV  Anti-ischemic medical therapy: Maximal Therapy (2 or more classes of medications)  Non-Invasive Test Results: No non-invasive testing performed  Prior CABG: No previous CABG      History and Physical Interval Note:  03/25/2019 11:41 AM  Jeffery Carpenter  has presented today for surgery, with the diagnosis of unstable angina.  The various methods of treatment have been discussed with the patient and family. After consideration of risks, benefits and other options for treatment, the patient has consented to  Procedure(s): LEFT HEART CATH AND CORONARY ANGIOGRAPHY (N/A) as a surgical intervention.  The patient's history has been reviewed, patient examined, no change in status, stable for surgery.  I have reviewed the patient's chart and labs.  Questions were answered to the patient's satisfaction.     Jeffery Carpenter

## 2019-03-26 ENCOUNTER — Encounter (HOSPITAL_COMMUNITY): Payer: Self-pay | Admitting: Cardiology

## 2019-03-26 DIAGNOSIS — I259 Chronic ischemic heart disease, unspecified: Secondary | ICD-10-CM

## 2019-03-26 DIAGNOSIS — I214 Non-ST elevation (NSTEMI) myocardial infarction: Principal | ICD-10-CM

## 2019-03-26 LAB — BASIC METABOLIC PANEL
Anion gap: 6 (ref 5–15)
BUN: 17 mg/dL (ref 8–23)
CO2: 24 mmol/L (ref 22–32)
Calcium: 8.8 mg/dL — ABNORMAL LOW (ref 8.9–10.3)
Chloride: 111 mmol/L (ref 98–111)
Creatinine, Ser: 1.45 mg/dL — ABNORMAL HIGH (ref 0.61–1.24)
GFR calc Af Amer: 53 mL/min — ABNORMAL LOW (ref >60)
GFR calc non Af Amer: 45 mL/min — ABNORMAL LOW (ref >60)
Glucose, Bld: 129 mg/dL — ABNORMAL HIGH (ref 70–99)
Potassium: 4.3 mmol/L (ref 3.5–5.1)
Sodium: 141 mmol/L (ref 135–145)

## 2019-03-26 LAB — CBC
HCT: 37.5 % — ABNORMAL LOW (ref 39.0–52.0)
Hemoglobin: 13.2 g/dL (ref 13.0–17.0)
MCH: 32.9 pg (ref 26.0–34.0)
MCHC: 35.2 g/dL (ref 30.0–36.0)
MCV: 93.5 fL (ref 80.0–100.0)
Platelets: 243 10*3/uL (ref 150–400)
RBC: 4.01 MIL/uL — ABNORMAL LOW (ref 4.22–5.81)
RDW: 12.1 % (ref 11.5–15.5)
WBC: 8.5 10*3/uL (ref 4.0–10.5)
nRBC: 0 % (ref 0.0–0.2)

## 2019-03-26 LAB — GLUCOSE, CAPILLARY: Glucose-Capillary: 127 mg/dL — ABNORMAL HIGH (ref 70–99)

## 2019-03-26 LAB — MAGNESIUM: Magnesium: 2.1 mg/dL (ref 1.7–2.4)

## 2019-03-26 MED ORDER — METOPROLOL TARTRATE 25 MG PO TABS: 25.0000 mg | ORAL_TABLET | Freq: Two times a day (BID) | ORAL | 0 refills | Status: AC

## 2019-03-26 MED ORDER — CLOPIDOGREL BISULFATE 75 MG PO TABS: 75.0000 mg | ORAL_TABLET | Freq: Every day | ORAL | 1 refills | Status: AC

## 2019-03-26 MED ORDER — ATORVASTATIN CALCIUM 80 MG PO TABS: 80.0000 mg | ORAL_TABLET | Freq: Every day | ORAL | 1 refills | Status: AC

## 2019-03-26 MED ORDER — NITROGLYCERIN 0.4 MG SL SUBL: 0.4000 mg | SUBLINGUAL_TABLET | SUBLINGUAL | 1 refills | Status: AC | PRN

## 2019-03-26 MED ORDER — AMLODIPINE BESYLATE 5 MG PO TABS: 5.0000 mg | ORAL_TABLET | Freq: Every day | ORAL | 1 refills | Status: AC

## 2019-03-26 MED ORDER — METFORMIN HCL 500 MG PO TABS: 500.0000 mg | ORAL_TABLET | Freq: Two times a day (BID) | ORAL | 0 refills | Status: DC

## 2019-03-26 MED ORDER — ASPIRIN 81 MG PO CHEW: 81.0000 mg | CHEWABLE_TABLET | Freq: Every day | ORAL | 1 refills | Status: AC

## 2019-03-26 MED FILL — ASPIRIN LOW DOSE 81 MG CHEW: 81 | 90 days supply | Qty: 90 | Fill #0

## 2019-03-26 MED FILL — METOPROLOL TARTRATE 25 MG T: 25 | 90 days supply | Qty: 180 | Fill #0

## 2019-03-26 MED FILL — metFORMIN HCL 500 MG TABS: 500 | 90 days supply | Qty: 180 | Fill #0

## 2019-03-26 MED FILL — ATORVASTATIN CALCIUM 80 MG: 80 | 90 days supply | Qty: 90 | Fill #0

## 2019-03-26 MED FILL — NITROGLYCERIN 0.4 MG TAB SL: 0.4 | 7 days supply | Qty: 25 | Fill #0

## 2019-03-26 MED FILL — CLOPIDOGREL 75 MG TABLET: 75 | 90 days supply | Qty: 90 | Fill #0

## 2019-03-26 MED FILL — AMLODIPINE BESYLATE 5 MG TA: 5 | 90 days supply | Qty: 90 | Fill #0

## 2019-03-26 NOTE — Discharge Summary (Signed)
Physician Discharge Summary  Jeffery Carpenter LOV:[id number] DOB: 1941/08/28 DOA: 03/23/2019  PCP: [name], L.Marlou Sa, MD  Admit date: 03/23/2019 Discharge date: 03/26/2019  Admitted From: Home Disposition: Home  Recommendations for Outpatient Follow-up:  1. Follow up with PCP and cardiology in 1-2 weeks 2. Please obtain CBC/BMP/Mag at follow up 3. Please follow up on the following pending results: None  Home Health: None Equipment/Devices: None  Discharge Condition: Stable CODE STATUS: Full code  Hospital Course: 78 y.o.malewith history of HLD and ex-smoker presenting with chest pain.  In ED, hypertensive and slightly tachycardic.  EKG without acute ischemic changes.  Troponin trended from 71-121-101. Patient had left heart catheterization and stent placement on 03/25/2019.  See details below.  See individual problem list below for more.  Discharge Diagnoses:  ACS/non-STEMI: EKG without acute ischemic finding.  Mild troponin elevation. -Status post LHC with stent placement on 03/25/2019. -DAPT (Plavix and aspirin), metoprolol, atorvastatin and PRN nitro -Scheduled for phase 2 cardiac rehab as outpatient -Follow-up with cardiology in 1 week  New diagnosis of hypertension: -Discharged on amlodipine and metoprolol  CKD-2: Stable  New diagnosis of DM-2/hyperlipidemia: A1c 6.6%.  LDL 79. -Discharged on low-dose metformin  Anxiety: -Continue home Klonopin  Discharge Instructions  Discharge Instructions    Amb Referral to Cardiac Rehabilitation   Complete by: As directed    Referring to [place] CRP 2 also   Diagnosis:  NSTEMI Coronary Stents     After initial evaluation and assessments completed: Virtual Based Care may be provided alone or in conjunction with Phase 2 Cardiac Rehab based on patient barriers.: Yes   Call MD for:  difficulty breathing, headache or visual disturbances   Complete by: As directed    Call MD for:  extreme fatigue   Complete by: As  directed    Call MD for:  persistant dizziness or light-headedness   Complete by: As directed    Call MD for:  severe uncontrolled pain   Complete by: As directed    Call MD for:  temperature >100.4   Complete by: As directed    Diet - low sodium heart healthy   Complete by: As directed    Diet Carb Modified   Complete by: As directed    Discharge instructions   Complete by: As directed    It has been a pleasure taking care of you! You were admitted with chest pain which was from your heart.  You were treated with heart catheterization.  You are discharged on medications that you need to continue taking after you leave the hospital.  Please review your new medication list and the directions before you take your medications. Please call your primary care office  as soon as possible to schedule hospital follow-up visit in 1 to 2 weeks. Please go to your appointment with cardiologist as scheduled.  Take care,   Increase activity slowly   Complete by: As directed      Allergies as of 03/26/2019      Reactions   Bee Venom Anaphylaxis   Chocolate Other (See Comments)   Causes migraines.   Cucumber Extract Other (See Comments)   Cucumbers cause migraine headaches   Onion Other (See Comments)   Onions cause migraine headaches   Orange Fruit [citrus]    Penicillins Other (See Comments)   "just not effective" Did it involve swelling of the face/tongue/throat, SOB, or low BP? No Did it involve sudden or severe rash/hives, skin peeling, or any reaction on the  inside of your mouth or nose? No Did you need to seek medical attention at a hospital or doctor's office? No When did it last happen? If all above answers are "NO", may proceed with cephalosporin use.      Medication List    STOP taking these medications   aspirin EC 81 MG tablet Replaced by: aspirin 81 MG chewable tablet   pravastatin 40 MG tablet Commonly known as: PRAVACHOL   propranolol 40 MG tablet Commonly  known as: INDERAL   [facility] JOHNS WORT PO     TAKE these medications   amLODipine 5 MG tablet Commonly known as: NORVASC Take 1 tablet (5 mg total) by mouth daily.   aspirin 81 MG chewable tablet Chew 1 tablet (81 mg total) by mouth daily. Replaces: aspirin EC 81 MG tablet   atorvastatin 80 MG tablet Commonly known as: LIPITOR Take 1 tablet (80 mg total) by mouth daily at 6 PM.   clonazePAM 0.5 MG tablet Commonly known as: KLONOPIN Take 0.5 mg by mouth 2 (two) times daily as needed for anxiety.   clopidogrel 75 MG tablet Commonly known as: PLAVIX Take 1 tablet (75 mg total) by mouth daily with breakfast.   EPINEPHrine 0.3 mg/0.3 mL Soaj injection Commonly known as: EPI-PEN Inject 0.3 mLs (0.3 mg total) into the muscle as needed for anaphylaxis.   folic acid 1 MG tablet Commonly known as: FOLVITE Take 1 tablet (1 mg total) by mouth daily.   metFORMIN 500 MG tablet Commonly known as: Glucophage Take 1 tablet (500 mg total) by mouth 2 (two) times daily with a meal.   metoprolol tartrate 25 MG tablet Commonly known as: LOPRESSOR Take 1 tablet (25 mg total) by mouth 2 (two) times daily.   nitroGLYCERIN 0.4 MG SL tablet Commonly known as: NITROSTAT Place 1 tablet (0.4 mg total) under the tongue every 5 (five) minutes as needed for chest pain (CP or SOB).   thiamine 50 MG tablet Commonly known as: VITAMIN B-1 Take 50 mg by mouth daily.   triamcinolone cream 0.5 % Commonly known as: KENALOG Apply 1 application topically 3 (three) times daily as needed (itching/ insect bites).      Follow-up Information    [name], L.Marlou Sa, MD. Schedule an appointment as soon as possible for a visit in 2 week(s).   Specialty: Family Medicine Contact information: [address] [place] [address] [phone]           Consultations:  Cardiology  Procedures/Studies:  2D Echo on 03/23/2019  1. The left ventricle has normal systolic function with an  ejection fraction of 60-65%. The cavity size was normal. There is mildly increased left ventricular wall thickness. Left ventricular diastolic Doppler parameters are consistent with impaired  relaxation. Indeterminate filling pressures The E/e' is 8-15. No evidence of left ventricular regional wall motion abnormalities.  2. The right ventricle has normal systolic function. The cavity was normal. There is no increase in right ventricular wall thickness.  3. The mitral valve is grossly normal.  4. The tricuspid valve is grossly normal.  5. The aortic valve is tricuspid. Mild sclerosis of the aortic valve. No stenosis of the aortic valve.  6. The aortic root and ascending aorta are normal in size and structure.  7. The average left ventricular global longitudinal strain is -17.0 %.  Cardiac catheterization on 03/25/2019  Dist Cx lesion is 99% stenosed with 99% stenosed side branch in 1st Mrg.  Prox RCA to Mid RCA lesion  is 40% stenosed.  Dist RCA-1 lesion is 50% stenosed.  Dist RCA-2 lesion is 50% stenosed.  The left ventricular systolic function is normal.  LV end diastolic pressure is normal. Intervention  PCI of mid 99% circumflex stenosis requiring overlapping stents (2.75 x 12 Onyx proximal/2.5 x 18 Onyx distal) postdilated 3.0 mm in diameter with TIMI grade III flow.  99% stenosis resolved to 0%.  Localized dissection just distal to the culprit lesion was covered and resolved in the process of the procedure.  Dg Chest 2 View  Result Date: 03/23/2019 CLINICAL DATA:  Initial evaluation for acute chest pressure, nausea, left arm pain. EXAM: CHEST - 2 VIEW COMPARISON:  Prior radiograph from 12/01/2005. FINDINGS: Cardiac and mediastinal silhouettes are stable in size and contour, and remain within normal limits. Aortic atherosclerosis. Lungs normally inflated. Mild left basilar subsegmental atelectasis. No focal infiltrates. No edema or effusion. No pneumothorax. No acute osseous finding.  IMPRESSION: 1. Mild left basilar subsegmental atelectasis. No other active cardiopulmonary disease. 2. Aortic atherosclerosis. Electronically Signed   By: Jeannine Boga M.D.   On: 03/23/2019 06:08      Subjective: No major events overnight of this morning.  No chest pain, dyspnea or palpitation.  No GI or GU symptoms.  Feels well and ready to go home.   Discharge Exam: Vitals:   03/26/19 0502 03/26/19 0542  BP: 127/90 (!) 146/91  Pulse:  72  Resp: 15 19  Temp:  98.1 F (36.7 C)  SpO2: 94% 95%    GENERAL: No acute distress.  Appears well.  HEENT: MMM.  Vision and hearing grossly intact.  NECK: Supple.  No JVD.  LUNGS:  No IWOB. Good air movement bilaterally. HEART:  RRR Heart sounds normal.  ABD: Bowel sounds present. Soft. Non tender.  MSK/EXT:  Moves all extremities. No apparent deformity. No edema bilaterally. SKIN: no apparent skin lesion or wound NEURO: Awake, alert and oriented appropriately.  No gross deficit.  PSYCH: Calm. Normal affect.   The results of significant diagnostics from this hospitalization (including imaging, microbiology, ancillary and laboratory) are listed below for reference.     Microbiology: Recent Results (from the past 240 hour(s))  SARS Coronavirus 2 (CEPHEID - Performed in [place] hospital lab), Hosp Order     Status: None   Collection Time: 03/23/19  6:12 AM   Specimen: Nasopharyngeal Swab  Result Value Ref Range Status   SARS Coronavirus 2 NEGATIVE NEGATIVE Final    Comment: (NOTE) If result is NEGATIVE SARS-CoV-2 target nucleic acids are NOT DETECTED. The SARS-CoV-2 RNA is generally detectable in upper and lower  respiratory specimens during the acute phase of infection. The lowest  concentration of SARS-CoV-2 viral copies this assay can detect is 250  copies / mL. A negative result does not preclude SARS-CoV-2 infection  and should not be used as the sole basis for treatment or other  patient management decisions.  A  negative result may occur with  improper specimen collection / handling, submission of specimen other  than nasopharyngeal swab, presence of viral mutation(s) within the  areas targeted by this assay, and inadequate number of viral copies  (<250 copies / mL). A negative result must be combined with clinical  observations, patient history, and epidemiological information. If result is POSITIVE SARS-CoV-2 target nucleic acids are DETECTED. The SARS-CoV-2 RNA is generally detectable in upper and lower  respiratory specimens dur ing the acute phase of infection.  Positive  results are indicative of active infection with SARS-CoV-2.  Clinical  correlation with patient history and other diagnostic information is  necessary to determine patient infection status.  Positive results do  not rule out bacterial infection or co-infection with other viruses. If result is PRESUMPTIVE POSTIVE SARS-CoV-2 nucleic acids MAY BE PRESENT.   A presumptive positive result was obtained on the submitted specimen  and confirmed on repeat testing.  While 2019 novel coronavirus  (SARS-CoV-2) nucleic acids may be present in the submitted sample  additional confirmatory testing may be necessary for epidemiological  and / or clinical management purposes  to differentiate between  SARS-CoV-2 and other Sarbecovirus currently known to infect humans.  If clinically indicated additional testing with an alternate test  methodology [phone]) is advised. The SARS-CoV-2 RNA is generally  detectable in upper and lower respiratory sp ecimens during the acute  phase of infection. The expected result is Negative. Fact Sheet for Patients:  StrictlyIdeas.no Fact Sheet for Healthcare Providers: BankingDealers.co.za This test is not yet approved or cleared by the [place] FDA and has been authorized for detection and/or diagnosis of SARS-CoV-2 by FDA under an Emergency Use  Authorization (EUA).  This EUA will remain in effect (meaning this test can be used) for the duration of the COVID-19 declaration under Section 564(b)(1) of the Act, 21 U.S.C. section 360bbb-3(b)(1), unless the authorization is terminated or revoked sooner. Performed at [facility], [place] [address] [place], [place] [address]      Labs: BNP (last 3 results) No results for input(s): BNP in the last 8760 hours. Basic Metabolic Panel: Recent Labs  Lab 03/23/19 0534 03/23/19 0909 03/24/19 1022 03/25/19 0347 03/26/19 0522  NA 139  --  140 139 141  K 4.1  --  4.7 4.2 4.3  CL 109  --  107 108 111  CO2 21*  --  24 23 24   GLUCOSE 177*  --  175* 140* 129*  BUN 25*  --  22 20 17   CREATININE 1.35* 1.28* 1.29* 1.37* 1.45*  CALCIUM 8.9  --  9.3 8.8* 8.8*  MG  --  2.3  --   --  2.1   Liver Function Tests: No results for input(s): AST, ALT, ALKPHOS, BILITOT, PROT, ALBUMIN in the last 168 hours. No results for input(s): LIPASE, AMYLASE in the last 168 hours. No results for input(s): AMMONIA in the last 168 hours. CBC: Recent Labs  Lab 03/23/19 0909 03/24/19 0814 03/24/19 1022 03/25/19 0347 03/26/19 0522  WBC 7.5 8.8 9.9 7.8 8.5  NEUTROABS  --   --  5.6  --   --   HGB 14.3 13.4 13.9 13.1 13.2  HCT 41.7 39.2 41.0 37.6* 37.5*  MCV 96.8 96.8 97.4 94.5 93.5  PLT 262 238 265 251 243   Cardiac Enzymes: No results for input(s): CKTOTAL, CKMB, CKMBINDEX, TROPONINI in the last 168 hours. BNP: Invalid input(s): POCBNP CBG: Recent Labs  Lab 03/24/19 2145 03/25/19 0742 03/25/19 1652 03/25/19 2136 03/26/19 0743  GLUCAP 125* 129* 94 163* 127*   D-Dimer No results for input(s): DDIMER in the last 72 hours. Hgb A1c No results for input(s): HGBA1C in the last 72 hours. Lipid Profile Recent Labs    03/24/19 0814  CHOL 136  HDL 42  LDLCALC 79  TRIG 74  CHOLHDL 3.2   Thyroid function studies No results for input(s): TSH, T4TOTAL, T3FREE, THYROIDAB in the  last 72 hours.  Invalid input(s): FREET3 Anemia work up No results for input(s): VITAMINB12, FOLATE, FERRITIN, TIBC, IRON, RETICCTPCT in the  last 72 hours. Urinalysis No results found for: COLORURINE, APPEARANCEUR, LABSPEC, PHURINE, GLUCOSEU, HGBUR, BILIRUBINUR, KETONESUR, PROTEINUR, UROBILINOGEN, NITRITE, LEUKOCYTESUR Sepsis Labs Invalid input(s): PROCALCITONIN,  WBC,  LACTICIDVEN   Time coordinating discharge: 35 minutes  SIGNED:  Mercy Riding, MD  [facility] 03/26/2019, 10:10 AM  If 7PM-7AM, please contact night-coverage [email or web address] Password [email or web address]

## 2019-03-26 NOTE — Progress Notes (Signed)
Subjective:  Patient denies any chest pain or shortness of breath states feeling much better today after PCI.  Objective:  Vital Signs in the last 24 hours: Temp:  [98 F (36.7 C)-98.1 F (36.7 C)] 98.1 F (36.7 C) (07/21 0542) Pulse Rate:  [0-93] 72 (07/21 0542) Resp:  [0-35] 19 (07/21 0542) BP: (121-172)/(66-122) 146/91 (07/21 0542) SpO2:  [0 %-100 %] 95 % (07/21 0542) Weight:  [81.8 kg] 81.8 kg (07/21 0542)  Intake/Output from previous day: 07/20 0701 - 07/21 0700 In: 2829.6 [P.O.:480; I.V.:2349.6] Out: 1750 [Urine:1750] Intake/Output from this shift: No intake/output data recorded.  Physical Exam: Neck: no adenopathy, no carotid bruit, no JVD and supple, symmetrical, trachea midline Lungs: clear to auscultation bilaterally Heart: regular rate and rhythm, S1, S2 normal and Soft systolic murmur noted Abdomen: soft, non-tender; bowel sounds normal; no masses,  no organomegaly Extremities: extremities normal, atraumatic, no cyanosis or edema and Right groin stable no evidence of hematoma or bruit  Lab Results: Recent Labs    03/25/19 0347 03/26/19 0522  WBC 7.8 8.5  HGB 13.1 13.2  PLT 251 243   Recent Labs    03/25/19 0347 03/26/19 0522  NA 139 141  K 4.2 4.3  CL 108 111  CO2 23 24  GLUCOSE 140* 129*  BUN 20 17  CREATININE 1.37* 1.45*   No results for input(s): TROPONINI in the last 72 hours.  Invalid input(s): CK, MB Hepatic Function Panel No results for input(s): PROT, ALBUMIN, AST, ALT, ALKPHOS, BILITOT, BILIDIR, IBILI in the last 72 hours. Recent Labs    03/24/19 0814  CHOL 136   No results for input(s): PROTIME in the last 72 hours.  Imaging: Imaging results have been reviewed and No results found.  Cardiac Studies:  Assessment/Plan:  Acute coronary syndrome status post left cardiac cath/PTCA stenting to left circumflex/obtuse marginal New onset hypertension New onset diabetes mellitus Hyperlipidemia Remote tobacco abuse History of  hemochromatosis Elevated TSH rule out hypothyroidism Plan Continue present management Okay to discharge from cardiac point of view Post cardiac cath/PCI instructions have been given Schedule for phase 2 cardiac rehab as outpatient Follow-up with me in 1 week  LOS: 2 days    [name] 03/26/2019, 9:00 AM

## 2019-03-26 NOTE — Progress Notes (Signed)
CARDIAC REHAB PHASE I   PRE:  Rate/Rhythm: 66 SR  BP:  Supine: 129/91  Sitting:   Standing:    SaO2: 96%RA  MODE:  Ambulation: 470 ft   POST:  Rate/Rhythm: 96 SR  BP:  Supine:   Sitting: 121/109, 136/109  Standing:    SaO2: 95%RA 0825-0915 Pt walked 470 ft on RA with steady gait and tolerated well. No CP. BP elevated before and after walk. Discussed with pt the importance of plavix with stents, NTG use, heart healthy and watching carbs and sodium diets,, ex ed and CRP 2. Pt stated he had small MI so gave MI booklet and discussed restrictions.  Discussed CRP 2 with pt and he was hesitant for referral as he goes to [facility]  in [place]. Pt not sure if he would attend and if he would which hospital he would want to attend. Will send referrals to [facility] and [facility] as pt indecisive and let them follow up.  Encouraged watching carbs with A1C of 6.6 and making sure he is followed by MD and to watch sodium with elevated BP.  Graylon Good, RN BSN  03/26/2019 9:58 AM

## 2019-04-02 ENCOUNTER — Telehealth (HOSPITAL_COMMUNITY): Payer: Self-pay

## 2019-04-02 NOTE — Telephone Encounter (Signed)
Pt insurance is active and benefits verified through [facility] Medicare Co-pay $20, DED 0/0 met, out of pocket $3,600/$405 met, co-insurance 0. no pre-authorization required. Passport, 04/02/2019, REF# [id number]  Will contact patient to see if he is interested in the Cardiac Rehab Program. If interested, patient will need to complete follow up appt. Once completed, patient will be contacted for scheduling upon review by the RN Navigator.

## 2019-04-05 ENCOUNTER — Encounter (HOSPITAL_COMMUNITY)
Admission: RE | Admit: 2019-04-05 | Discharge: 2019-04-05 | Disposition: A | Discharge status: Home / Self Care | Payer: Medicare Other | Source: Ambulatory Visit | Attending: Cardiology | Admitting: Cardiology

## 2019-04-05 ENCOUNTER — Other Ambulatory Visit: Payer: Self-pay

## 2019-04-05 NOTE — Telephone Encounter (Signed)
Called Dr. Terrence Dupont office to tell them to fax over pt office visit for 04/05/2019 visit. They stated they will be faxing visit info over today. Tedra Senegal. Support Rep II

## 2019-04-05 NOTE — Progress Notes (Signed)
Contacted by pt this morning requesting schedule for Cardiac Rehab virtually due to 20.00 copay and fearful of coming into the hospital gym. Pt seen in follow up by Dr. Terrence Dupont.   Pt asked if he could "stop" by and sign up for the virtual cardiac rehab.  Pt has appt across the street for hearing aid.  Established time for pt to come by and directions on how to find [place].           Confirm Consent - In the setting of the current Covid19 crisis, you are scheduled for a phone visit with your Cardiac or Pulmonary team member.  Just as we do with many in-gym visits, in order for you to participate in this visit, we must obtain consent.  If you'd like, I can send this to your mychart (if signed up) or email for you to review.  Otherwise, I can obtain your verbal consent now.  By agreeing to a telephone visit, we'd like you to understand that the technology does not allow for your Cardiac or Pulmonary Rehab team member to perform a physical assessment, and thus may limit their ability to fully assess your ability to perform exercise programs. If your provider identifies any concerns that need to be evaluated in person, we will make arrangements to do so.  Finally, though the technology is pretty good, we cannot assure that it will always work on either your or our end and we cannot ensure that we have a secure connection.  Cardiac and Pulmonary Rehab Telehealth visits and "At Home" cardiac and pulmonary rehab are provided at no cost to you.        Are you willing to proceed?"        STAFF: Did the patient verbally acknowledge consent to telehealth visit? Document YES/NO here: Yes     Maurice Small RN, BSN Cardiac and Pulmonary Rehab Nurse Navigator    Cardiac and Pulmonary Rehab Staff        Date 04/05/19   @ Time 1415   Pt arrived and reassurance provided on how we keep our patients safe during exercise.  Advised pt that he could do just 2 days a week and do the virtual on the days he is not coming onsite.   Pt felt he could financially handle two days a week and and chose Monday and Wednesdays.  Pt scheduled for orientation on 8/13 and will begin exercise on 8/17 at 11:15.  Pt assisted with downloading the [employer] app to his phone, login and how to add exercise and vital signs.  Pt able to return demonstrate successfully.  Pt brought his monitor in for checking his blood glucose.  Pt did not have all the necessary supplies.  Showed him how to use the meter, stick his finger and apply sample of blood.   Pt CBG 117.  Pt appreciative of this.  Dr. Terrence Dupont returned pt call.  Relayed to Dr. Terrence Dupont his blood glucose.  Pt wanted to know if he could drive.  Dr. Terrence Dupont asked that he wait 2 more weeks due to extensive bruising to his groin. No further questions for pt.  Walked pt out to meet up with his wife and grandchildren. Pt given paperwork for the upcoming appt. Cherre Huger, BSN Cardiac and Training and development officer

## 2019-04-16 ENCOUNTER — Telehealth (HOSPITAL_COMMUNITY): Payer: Self-pay | Admitting: Pharmacist

## 2019-04-16 NOTE — Telephone Encounter (Signed)
Cardiac Rehab Medication Review by a Pharmacist  Does the patient feel that his/her medications are working for him/her?  No; pt feels like his meds are not controlling his blood pressure and blood sugar  Has the patient been experiencing any side effects to the medications prescribed?  No; pt does report momentary angina  and radiating shoulder pain from time to time but it is not severe and goes away quickly  Does the patient measure his/her own blood pressure or blood glucose at home?  Yes; BP this AM was 105/74; BG was 140; he does report that his BG fluctuates even though he has been eating   Does the patient have any problems obtaining medications due to transportation or finances?   No; he cannot drive for now but his wife drives him anywhere he needs to go  Understanding of regimen: excellent Understanding of indications: excellent Potential of compliance: excellent  Kennon Holter, PharmD PGY1 Ambulatory Care Pharmacy Resident [employer] Phone: [phone] 04/16/2019 9:31 AM

## 2019-04-18 ENCOUNTER — Other Ambulatory Visit: Payer: Self-pay

## 2019-04-18 ENCOUNTER — Encounter (HOSPITAL_COMMUNITY)
Admission: RE | Admit: 2019-04-18 | Discharge: 2019-04-18 | Disposition: A | Discharge status: Home / Self Care | Payer: Medicare Other | Source: Ambulatory Visit | Attending: Cardiology | Admitting: Cardiology

## 2019-04-18 VITALS — Ht 67.5 in | Wt 179.9 lb

## 2019-04-18 DIAGNOSIS — I214 Non-ST elevation (NSTEMI) myocardial infarction: Secondary | ICD-10-CM | POA: Insufficient documentation

## 2019-04-18 DIAGNOSIS — Z955 Presence of coronary angioplasty implant and graft: Secondary | ICD-10-CM | POA: Insufficient documentation

## 2019-04-18 NOTE — Progress Notes (Signed)
Cardiac Individual Treatment Plan  Patient Details  Name: Jeffery Carpenter MRN: [id number] Date of Birth: Feb 24, 1941 Referring Provider:     CARDIAC REHAB PHASE II ORIENTATION from 04/18/2019 in [place]  Referring Provider  Dr. Terrence Dupont      Initial Encounter Date:    CARDIAC REHAB PHASE II ORIENTATION from 04/18/2019 in [place]  Date  04/18/19      Visit Diagnosis: NSTEMI (non-ST elevated myocardial infarction) [facility])  Status post coronary artery stent placement  Patient's Home Medications on Admission:  Current Outpatient Medications:  .  acetaminophen (TYLENOL) 500 MG tablet, Take 500 mg by mouth every 6 (six) hours as needed for mild pain, moderate pain or headache., Disp: , Rfl:  .  amLODipine (NORVASC) 5 MG tablet, Take 1 tablet (5 mg total) by mouth daily., Disp: 90 tablet, Rfl: 1 .  aspirin 81 MG chewable tablet, Chew 1 tablet (81 mg total) by mouth daily., Disp: 90 tablet, Rfl: 1 .  atorvastatin (LIPITOR) 80 MG tablet, Take 1 tablet (80 mg total) by mouth daily at 6 PM., Disp: 90 tablet, Rfl: 1 .  clonazePAM (KLONOPIN) 0.5 MG tablet, Take 0.5 mg by mouth 2 (two) times daily as needed for anxiety. , Disp: , Rfl:  .  clopidogrel (PLAVIX) 75 MG tablet, Take 1 tablet (75 mg total) by mouth daily with breakfast., Disp: 90 tablet, Rfl: 1 .  diphenhydrAMINE (BENADRYL) 25 MG tablet, Take 25 mg by mouth daily as needed for itching (bug bites)., Disp: , Rfl:  .  EPINEPHrine 0.3 mg/0.3 mL IJ SOAJ injection, Inject 0.3 mLs (0.3 mg total) into the muscle as needed for anaphylaxis., Disp: 1 Device, Rfl: 0 .  folic acid (FOLVITE) 1 MG tablet, Take 1 tablet (1 mg total) by mouth daily., Disp: 30 tablet, Rfl: 12 .  metFORMIN (GLUCOPHAGE) 500 MG tablet, Take 1 tablet (500 mg total) by mouth 2 (two) times daily with a meal., Disp: 180 tablet, Rfl: 0 .  metoprolol tartrate (LOPRESSOR) 25 MG tablet, Take 1 tablet (25 mg total) by  mouth 2 (two) times daily., Disp: 180 tablet, Rfl: 0 .  nitroGLYCERIN (NITROSTAT) 0.4 MG SL tablet, Place 1 tablet (0.4 mg total) under the tongue every 5 (five) minutes as needed for chest pain (CP or SOB)., Disp: 25 tablet, Rfl: 1 .  thiamine (VITAMIN B-1) 50 MG tablet, Take 50 mg by mouth daily.  , Disp: , Rfl:  .  triamcinolone cream (KENALOG) 0.5 %, Apply 1 application topically 3 (three) times daily as needed (itching/ insect bites)., Disp: , Rfl:   Past Medical History: Past Medical History:  Diagnosis Date  . 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 Hemochromatosis   . HTN (hypertension), benign 02/06/2014   pt takes inderal for pre competition calmness  . 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 Other and unspecified hyperlipidemia 02/06/2014    Tobacco Use: Social History   Tobacco Use  Smoking Status Former Smoker  . Quit date: 02/07/1960  . 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 [employer] Cardiac and Pulmonary Rehab Latest Ref Rng & Units 03/23/2019 03/24/2019   Cholestrol 0 - 200 mg/dL - 136   LDLCALC 0 - 99 mg/dL - 79   HDL >40 mg/dL - 42   Trlycerides <150 mg/dL - 74   Hemoglobin A1c 4.8 -  5.6 % 6.6(H) -      Capillary Blood Glucose: Lab Results  Component Value Date   GLUCAP 127 (H) 03/26/2019   GLUCAP 163 (H) 03/25/2019   GLUCAP 94 03/25/2019   GLUCAP 129 (H) 03/25/2019   GLUCAP 125 (H) 03/24/2019     Exercise Target Goals: Exercise Program Goal: Individual exercise prescription set using results from initial 6 min walk test and THRR while considering  patient's activity barriers and safety.   Exercise Prescription Goal: Initial exercise prescription builds to 30-45 minutes a day of aerobic activity, 2-3 days per week.  Home exercise guidelines will be given to patient during program as part of exercise prescription that the participant will acknowledge.  Activity Barriers & Risk  Stratification: Activity Barriers & Cardiac Risk Stratification - 04/18/19 1157      Activity Barriers & Cardiac Risk Stratification   Activity Barriers  Balance Concerns;Deconditioning    Cardiac Risk Stratification  High       6 Minute Walk: 6 Minute Walk    Row Name 04/18/19 1157         6 Minute Walk   Phase  Initial     Distance  1000 feet     Walk Time  6 minutes     # of Rest Breaks  0     MPH  1.89     METS  1.65     RPE  11     Perceived Dyspnea   0     VO2 Peak  5.8     Symptoms  No     Resting HR  71 bpm     Resting BP  104/70     Resting Oxygen Saturation   99 %     Exercise Oxygen Saturation  during 6 min walk  99 %     Max Ex. HR  92 bpm     Max Ex. BP  110/68     2 Minute Post BP  102/68        Oxygen Initial Assessment:   Oxygen Re-Evaluation:   Oxygen Discharge (Final Oxygen Re-Evaluation):   Initial Exercise Prescription: Initial Exercise Prescription - 04/18/19 1100      Date of Initial Exercise RX and Referring Provider   Date  04/18/19    Referring Provider  Dr. Terrence Dupont    Expected Discharge Date  05/31/19      NuStep   Level  2    SPM  75    Minutes  15    METs  1.5      Arm Ergometer   Level  1.5    Watts  20    Minutes  15    METs  1.6      Prescription Details   Frequency (times per week)  2    Duration  Progress to 30 minutes of continuous aerobic without signs/symptoms of physical distress      Intensity   THRR 40-80% of Max Heartrate  56-113    Ratings of Perceived Exertion  11-13      Progression   Progression  Continue to progress workloads to maintain intensity without signs/symptoms of physical distress.      Resistance Training   Training Prescription  Yes    Weight  4 lbs.     Reps  10-15       Perform Capillary Blood Glucose checks as needed.  Exercise Prescription Changes:   Exercise Comments:   Exercise Goals and Review: Exercise  Goals    Row Name 04/18/19 1200             Exercise  Goals   Increase Physical Activity  Yes       Intervention  Provide advice, education, support and counseling about physical activity/exercise needs.;Develop an individualized exercise prescription for aerobic and resistive training based on initial evaluation findings, risk stratification, comorbidities and participant's personal goals.       Expected Outcomes  Short Term: Attend rehab on a regular basis to increase amount of physical activity.;Long Term: Add in home exercise to make exercise part of routine and to increase amount of physical activity.;Long Term: Exercising regularly at least 3-5 days a week.       Increase Strength and Stamina  Yes       Intervention  Provide advice, education, support and counseling about physical activity/exercise needs.;Develop an individualized exercise prescription for aerobic and resistive training based on initial evaluation findings, risk stratification, comorbidities and participant's personal goals.       Expected Outcomes  Short Term: Increase workloads from initial exercise prescription for resistance, speed, and METs.;Short Term: Perform resistance training exercises routinely during rehab and add in resistance training at home;Long Term: Improve cardiorespiratory fitness, muscular endurance and strength as measured by increased METs and functional capacity (6MWT)       Able to understand and use rate of perceived exertion (RPE) scale  Yes       Intervention  Provide education and explanation on how to use RPE scale       Expected Outcomes  Short Term: Able to use RPE daily in rehab to express subjective intensity level;Long Term:  Able to use RPE to guide intensity level when exercising independently       Knowledge and understanding of Target Heart Rate Range (THRR)  Yes       Intervention  Provide education and explanation of THRR including how the numbers were predicted and where they are located for reference       Expected Outcomes  Short Term: Able  to state/look up THRR;Long Term: Able to use THRR to govern intensity when exercising independently;Short Term: Able to use daily as guideline for intensity in rehab       Able to check pulse independently  Yes       Intervention  Provide education and demonstration on how to check pulse in carotid and radial arteries.;Review the importance of being able to check your own pulse for safety during independent exercise       Expected Outcomes  Short Term: Able to explain why pulse checking is important during independent exercise;Long Term: Able to check pulse independently and accurately       Understanding of Exercise Prescription  Yes       Intervention  Provide education, explanation, and written materials on patient's individual exercise prescription       Expected Outcomes  Short Term: Able to explain program exercise prescription;Long Term: Able to explain home exercise prescription to exercise independently          Exercise Goals Re-Evaluation :   Discharge Exercise Prescription (Final Exercise Prescription Changes):   Nutrition:  Target Goals: Understanding of nutrition guidelines, daily intake of sodium 1500mg , cholesterol 200mg , calories 30% from fat and 7% or less from saturated fats, daily to have 5 or more servings of fruits and vegetables.  Biometrics: Pre Biometrics - 04/18/19 1200      Pre Biometrics   Height  5' 7.5" (1.715  m)    Weight  81.6 kg    Waist Circumference  40 inches    Hip Circumference  40.5 inches    Waist to Hip Ratio  0.99 %    BMI (Calculated)  27.74    Triceps Skinfold  25 mm    % Body Fat  30.1 %    Grip Strength  35 kg    Flexibility  14 in    Single Leg Stand  3.3 seconds        Nutrition Therapy Plan and Nutrition Goals:   Nutrition Assessments:   Nutrition Goals Re-Evaluation:   Nutrition Goals Re-Evaluation:   Nutrition Goals Discharge (Final Nutrition Goals Re-Evaluation):   Psychosocial: Target Goals: Acknowledge  presence or absence of significant depression and/or stress, maximize coping skills, provide positive support system. Participant is able to verbalize types and ability to use techniques and skills needed for reducing stress and depression.  Initial Review & Psychosocial Screening: Initial Psych Review & Screening - 04/18/19 1205      Initial Review   Current issues with  Current Stress Concerns    Source of Stress Concerns  Unable to participate in former interests or hobbies;Unable to perform yard/household activities;Financial    Comments  Jeffery Carpenter states that his cardiologist has limited his ability to do certain things such as mowing the lawn and driving.      Family Dynamics   Good Support System?  Yes   Pt's wife is a source of support for [place].     Barriers   Psychosocial barriers to participate in program  The patient should benefit from training in stress management and relaxation.      Screening Interventions   Interventions  Encouraged to exercise       Quality of Life Scores: Quality of Life - 04/18/19 1201      Quality of Life   Select  Quality of Life      Quality of Life Scores   Health/Function Pre  22.68 %    Socioeconomic Pre  27 %    Psych/Spiritual Pre  23.79 %    Family Pre  25.7 %    GLOBAL Pre  24.11 %      Scores of 19 and below usually indicate a poorer quality of life in these areas.  A difference of  2-3 points is a clinically meaningful difference.  A difference of 2-3 points in the total score of the Quality of Life Index has been associated with significant improvement in overall quality of life, self-image, physical symptoms, and general health in studies assessing change in quality of life.  PHQ-9: Recent Review Flowsheet Data    Depression screen [facility] 2/9 04/30/2018 04/24/2017 02/29/2016 02/16/2015 02/06/2014   Decreased Interest 0 0 0 0 0   Down, Depressed, Hopeless 0 0 0 0 0   PHQ - 2 Score 0 0 0 0 0     Interpretation of Total Score  Total  Score Depression Severity:  1-4 = Minimal depression, 5-9 = Mild depression, 10-14 = Moderate depression, 15-19 = Moderately severe depression, 20-27 = Severe depression   Psychosocial Evaluation and Intervention:   Psychosocial Re-Evaluation:   Psychosocial Discharge (Final Psychosocial Re-Evaluation):   Vocational Rehabilitation: Provide vocational rehab assistance to qualifying candidates.   Vocational Rehab Evaluation & Intervention: Vocational Rehab - 04/18/19 1210      Initial Vocational Rehab Evaluation & Intervention   Assessment shows need for Vocational Rehabilitation  No  Education: Education Goals: Education classes will be provided on a weekly basis, covering required topics. Participant will state understanding/return demonstration of topics presented.  Learning Barriers/Preferences: Learning Barriers/Preferences - 04/18/19 1202      Learning Barriers/Preferences   Learning Barriers  Sight;Hearing    Learning Preferences  Individual Instruction;Skilled Demonstration       Education Topics: Count Your Pulse:  -Group instruction provided by verbal instruction, demonstration, patient participation and written materials to support subject.  Instructors address importance of being able to find your pulse and how to count your pulse when at home without a heart monitor.  Patients get hands on experience counting their pulse with staff help and individually.   Heart Attack, Angina, and Risk Factor Modification:  -Group instruction provided by verbal instruction, video, and written materials to support subject.  Instructors address signs and symptoms of angina and heart attacks.    Also discuss risk factors for heart disease and how to make changes to improve heart health risk factors.   Functional Fitness:  -Group instruction provided by verbal instruction, demonstration, patient participation, and written materials to support subject.  Instructors address  safety measures for doing things around the house.  Discuss how to get up and down off the floor, how to pick things up properly, how to safely get out of a chair without assistance, and balance training.   Meditation and Mindfulness:  -Group instruction provided by verbal instruction, patient participation, and written materials to support subject.  Instructor addresses importance of mindfulness and meditation practice to help reduce stress and improve awareness.  Instructor also leads participants through a meditation exercise.    Stretching for Flexibility and Mobility:  -Group instruction provided by verbal instruction, patient participation, and written materials to support subject.  Instructors lead participants through series of stretches that are designed to increase flexibility thus improving mobility.  These stretches are additional exercise for major muscle groups that are typically performed during regular warm up and cool down.   Hands Only CPR:  -Group verbal, video, and participation provides a basic overview of AHA guidelines for community CPR. Role-play of emergencies allow participants the opportunity to practice calling for help and chest compression technique with discussion of AED use.   Hypertension: -Group verbal and written instruction that provides a basic overview of hypertension including the most recent diagnostic guidelines, risk factor reduction with self-care instructions and medication management.    Nutrition I class: Heart Healthy Eating:  -Group instruction provided by PowerPoint slides, verbal discussion, and written materials to support subject matter. The instructor gives an explanation and review of the Therapeutic Lifestyle Changes diet recommendations, which includes a discussion on lipid goals, dietary fat, sodium, fiber, plant stanol/sterol esters, sugar, and the components of a well-balanced, healthy diet.   Nutrition II class: Lifestyle Skills:   -Group instruction provided by PowerPoint slides, verbal discussion, and written materials to support subject matter. The instructor gives an explanation and review of label reading, grocery shopping for heart health, heart healthy recipe modifications, and ways to make healthier choices when eating out.   Diabetes Question & Answer:  -Group instruction provided by PowerPoint slides, verbal discussion, and written materials to support subject matter. The instructor gives an explanation and review of diabetes co-morbidities, pre- and post-prandial blood glucose goals, pre-exercise blood glucose goals, signs, symptoms, and treatment of hypoglycemia and hyperglycemia, and foot care basics.   Diabetes Blitz:  -Group instruction provided by PowerPoint slides, verbal discussion, and written materials to support subject  matter. The instructor gives an explanation and review of the physiology behind type 1 and type 2 diabetes, diabetes medications and rational behind using different medications, pre- and post-prandial blood glucose recommendations and Hemoglobin A1c goals, diabetes diet, and exercise including blood glucose guidelines for exercising safely.    Portion Distortion:  -Group instruction provided by PowerPoint slides, verbal discussion, written materials, and food models to support subject matter. The instructor gives an explanation of serving size versus portion size, changes in portions sizes over the last 20 years, and what consists of a serving from each food group.   Stress Management:  -Group instruction provided by verbal instruction, video, and written materials to support subject matter.  Instructors review role of stress in heart disease and how to cope with stress positively.     Exercising on Your Own:  -Group instruction provided by verbal instruction, power point, and written materials to support subject.  Instructors discuss benefits of exercise, components of exercise,  frequency and intensity of exercise, and end points for exercise.  Also discuss use of nitroglycerin and activating EMS.  Review options of places to exercise outside of rehab.  Review guidelines for sex with heart disease.   Cardiac Drugs I:  -Group instruction provided by verbal instruction and written materials to support subject.  Instructor reviews cardiac drug classes: antiplatelets, anticoagulants, beta blockers, and statins.  Instructor discusses reasons, side effects, and lifestyle considerations for each drug class.   Cardiac Drugs II:  -Group instruction provided by verbal instruction and written materials to support subject.  Instructor reviews cardiac drug classes: angiotensin converting enzyme inhibitors (ACE-I), angiotensin II receptor blockers (ARBs), nitrates, and calcium channel blockers.  Instructor discusses reasons, side effects, and lifestyle considerations for each drug class.   Anatomy and Physiology of the Circulatory System:  Group verbal and written instruction and models provide basic cardiac anatomy and physiology, with the coronary electrical and arterial systems. Review of: AMI, Angina, Valve disease, Heart Failure, Peripheral Artery Disease, Cardiac Arrhythmia, Pacemakers, and the ICD.   Other Education:  -Group or individual verbal, written, or video instructions that support the educational goals of the cardiac rehab program.   Holiday Eating Survival Tips:  -Group instruction provided by PowerPoint slides, verbal discussion, and written materials to support subject matter. The instructor gives patients tips, tricks, and techniques to help them not only survive but enjoy the holidays despite the onslaught of food that accompanies the holidays.   Knowledge Questionnaire Score: Knowledge Questionnaire Score - 04/18/19 1201      Knowledge Questionnaire Score   Pre Score  19/24       Core Components/Risk Factors/Patient Goals at Admission: Personal Goals  and Risk Factors at Admission - 04/18/19 1202      Core Components/Risk Factors/Patient Goals on Admission    Weight Management  Yes;Weight Maintenance;Weight Loss;Obesity    Admit Weight  179 lb 14.3 oz (81.6 kg)    Diabetes  Yes    Intervention  Provide education about signs/symptoms and action to take for hypo/hyperglycemia.;Provide education about proper nutrition, including hydration, and aerobic/resistive exercise prescription along with prescribed medications to achieve blood glucose in normal ranges: Fasting glucose 65-99 mg/dL    Expected Outcomes  Short Term: Participant verbalizes understanding of the signs/symptoms and immediate care of hyper/hypoglycemia, proper foot care and importance of medication, aerobic/resistive exercise and nutrition plan for blood glucose control.;Long Term: Attainment of HbA1C < 7%.    Hypertension  Yes    Intervention  Provide education on  lifestyle modifcations including regular physical activity/exercise, weight management, moderate sodium restriction and increased consumption of fresh fruit, vegetables, and low fat dairy, alcohol moderation, and smoking cessation.;Monitor prescription use compliance.    Expected Outcomes  Short Term: Continued assessment and intervention until BP is < 140/70mm HG in hypertensive participants. < 130/68mm HG in hypertensive participants with diabetes, heart failure or chronic kidney disease.;Long Term: Maintenance of blood pressure at goal levels.    Lipids  Yes    Intervention  Provide education and support for participant on nutrition & aerobic/resistive exercise along with prescribed medications to achieve LDL 70mg , HDL >40mg .    Expected Outcomes  Short Term: Participant states understanding of desired cholesterol values and is compliant with medications prescribed. Participant is following exercise prescription and nutrition guidelines.;Long Term: Cholesterol controlled with medications as prescribed, with individualized  exercise RX and with personalized nutrition plan. Value goals: LDL < 70mg , HDL > 40 mg.       Core Components/Risk Factors/Patient Goals Review:    Core Components/Risk Factors/Patient Goals at Discharge (Final Review):    ITP Comments: ITP Comments    Row Name 04/18/19 1011           ITP Comments  Dr. Fransico Him, Medical Director          Comments: Patient attended orientation on 04/18/2019 to review rules and guidelines for program.  Completed 6 minute walk test, Intitial ITP, and exercise prescription.  VSS. Telemetry-SR.  Asymptomatic. Safety measures and social distancing in place per CDC guidelines.

## 2019-04-22 ENCOUNTER — Encounter (HOSPITAL_COMMUNITY): Payer: Medicare Other

## 2019-04-22 ENCOUNTER — Ambulatory Visit (HOSPITAL_COMMUNITY): Payer: Medicare Other

## 2019-04-22 ENCOUNTER — Other Ambulatory Visit: Payer: Self-pay

## 2019-04-22 ENCOUNTER — Encounter (HOSPITAL_COMMUNITY)
Admission: RE | Admit: 2019-04-22 | Discharge: 2019-04-22 | Disposition: A | Discharge status: Home / Self Care | Payer: Medicare Other | Source: Ambulatory Visit | Attending: Cardiology | Admitting: Cardiology

## 2019-04-22 DIAGNOSIS — Z955 Presence of coronary angioplasty implant and graft: Secondary | ICD-10-CM

## 2019-04-22 DIAGNOSIS — I214 Non-ST elevation (NSTEMI) myocardial infarction: Secondary | ICD-10-CM | POA: Diagnosis not present

## 2019-04-22 LAB — GLUCOSE, CAPILLARY
Glucose-Capillary: 176 mg/dL — ABNORMAL HIGH (ref 70–99)
Glucose-Capillary: 122 mg/dL — ABNORMAL HIGH (ref 70–99)

## 2019-04-22 NOTE — Progress Notes (Signed)
Daily Session Note  Patient Details  Name: Jeffery Carpenter MRN: [id number] Date of Birth: 06/26/1941 Referring Provider:     CARDIAC REHAB PHASE II ORIENTATION from 04/18/2019 in [facility]  Referring Provider  Dr. Harwani      Encounter Date: 04/22/2019  Check In: Session Check In - 04/22/19 1130      Check-In   Supervising physician immediately available to respond to emergencies  [facility] immediately available    Physician(s)  Dr. Austira    Location  [facility]    Staff Present  Tara Everett, RN, BSN;Olinty Richards, MS, ACSM CEP, Exercise Physiologist;Maria Whitaker, RN, BSN    Virtual Visit  No    Medication changes reported      No    Fall or balance concerns reported     No    Tobacco Cessation  No Change    Warm-up and Cool-down  Performed on first and last piece of equipment    Resistance Training Performed  Yes    VAD Patient?  No    PAD/SET Patient?  No      Pain Assessment   Currently in Pain?  No/denies       Capillary Blood Glucose: Results for orders placed or performed during the hospital encounter of 04/22/19 (from the past 24 hour(s))  Glucose, capillary     Status: Abnormal   Collection Time: 04/22/19 12:22 PM  Result Value Ref Range   Glucose-Capillary 122 (H) 70 - 99 mg/dL      Social History   Tobacco Use  Smoking Status Former Smoker  . Quit date: 02/07/1960  . Years since quitting: 59.2  Smokeless Tobacco Former User    Goals Met:  Exercise tolerated well  Goals Unmet:  Not Applicable  Comments: Pt started cardiac rehab today.  Pt tolerated light exercise without difficulty. VSS, telemetry-SR, asymptomatic.  Medication list reconciled. Pt denies barriers to medicaiton compliance.  PSYCHOSOCIAL ASSESSMENT:  PHQ-0. Pt exhibits positive coping skills, hopeful outlook with supportive family. No psychosocial needs identified at this time, no psychosocial interventions necessary.  Pt  oriented to exercise equipment and routine.    Understanding verbalized.    Dr. Traci Turner is Medical Director for Cardiac Rehab at [facility]. 

## 2019-04-23 NOTE — Progress Notes (Signed)
Jeffery Carpenter was seen today in the movement disorders clinic for neurologic consultation at the request of [name], L.Marlou Sa, MD.  The consultation is for the evaluation of tremor.  The records that were made available to me were reviewed but no notes regarding tremor are seen.  Tremor: Yes.     How long has it been going on? Few years  When is it noted the most?  Pt doesn't even notice it much as it is head.  Daughter noticed it  Fam hx of tremor?  No.  Located where?  Head mostly - in the "yes" direction  Affected by stress:  Unknown as pt doesn't notice it enough to know  Admits to some neck pain.  Doesn't know if he can rotate neck in a position where stops head from shaking.  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  He has done that for a few years.   Was just recently started on metoprolol, s/p MI, and told to try shooting without the propranolol and see if that alone helps.    Tremor improving meds:  Propranolol, 40 mg prn; klonopin, 0.5 mg    ALLERGIES:   Allergies  Allergen Reactions  . Bee Venom Anaphylaxis  . Chocolate Other (See Comments)    Causes migraines.  . Cucumber Extract Other (See Comments)    Cucumbers cause migraine headaches  . Onion Other (See Comments)    Onions cause migraine headaches  . Orange Fruit [Citrus]   . Penicillins Other (See Comments)    "just not effective" Did it involve swelling of the face/tongue/throat, SOB, or low BP? No Did it involve sudden or severe rash/hives, skin peeling, or any reaction on the inside of your mouth or nose? No Did you need to seek medical attention at a hospital or doctor's office? No When did it last happen? If all above answers are "NO", may proceed with cephalosporin use.     CURRENT MEDICATIONS:  Current Outpatient  Medications  Medication Instructions  . acetaminophen (TYLENOL) 500 mg, Oral, Every 6 hours PRN  . amLODipine (NORVASC) 5 mg, Oral, Daily  . aspirin 81 mg, Oral, Daily  . atorvastatin (LIPITOR) 80 mg, Oral, Daily-1800  . clonazePAM (KLONOPIN) 0.5 mg, Oral, 2 times daily PRN  . clopidogrel (PLAVIX) 75 mg, Oral, Daily with breakfast  . diphenhydrAMINE (BENADRYL) 25 mg, Oral, Daily PRN  . EPINEPHrine (EPI-PEN) 0.3 mg, Intramuscular, As needed  . folic acid (FOLVITE) 1 mg, Oral, Daily  . metFORMIN (GLUCOPHAGE) 500 mg, Oral, 2 times daily with meals  . metoprolol tartrate (LOPRESSOR) 25 mg, Oral, 2 times daily  . nitroGLYCERIN (NITROSTAT) 0.4 mg, Sublingual, Every 5 min PRN  . thiamine (VITAMIN B-1) 50 mg, Oral, Daily  . triamcinolone cream (KENALOG) 0.5 % 1 application, Topical, 3 times daily PRN    PAST MEDICAL HISTORY:   Past Medical History:  Diagnosis Date  . 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 Hemochromatosis   . HTN (hypertension), benign 02/06/2014   pt takes inderal for pre competition calmness  . 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 Myocardial infarction ([place])   . Other and unspecified hyperlipidemia 02/06/2014    PAST SURGICAL HISTORY:   Past Surgical History:  Procedure Laterality Date  . APPENDECTOMY    .  BILATERAL CARPAL TUNNEL RELEASE Bilateral   . 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 EYE SURGERY     cataract  . HERNIA REPAIR     abdomen  . JOINT REPLACEMENT     lt TKA  . KNEE ARTHROSCOPY Right   . LEFT HEART CATH AND CORONARY ANGIOGRAPHY N/A 03/25/2019   Procedure: LEFT HEART CATH AND CORONARY ANGIOGRAPHY;  Surgeon: [name], MD;  Location: [facility];  Service: Cardiovascular;  Laterality: N/A;  . MASS EXCISION Left 09/12/2014   Procedure: LEFT ELBOW AND FOREARM MASS REMOVAL;  Surgeon:  Roseanne Kaufman, MD;  Location: [facility];  Service: Orthopedics;  Laterality: Left;  . TONSILLECTOMY    . WRIST SURGERY Left     SOCIAL HISTORY:   Social History   Socioeconomic History  . Marital status: Married    Spouse name: Not on file  . Number of children: 2  . Years of education: Not on file  . Highest education level: Associate degree: academic program  Occupational History  . Not on file  Social Needs  . Financial resource strain: Not on file  . Food insecurity    Worry: Not on file    Inability: Not on file  . Transportation needs    Medical: Not on file    Non-medical: Not on file  Tobacco Use  . Smoking status: Former Smoker    Quit date: 02/07/1960    Years since quitting: 59.2  . Smokeless tobacco: Former Network engineer and Sexual Activity  . Alcohol use: Yes    Alcohol/week: 0.0 standard drinks    Comment: socially.  . Drug use: No  . Sexual activity: Not Currently  Lifestyle  . Physical activity    Days per week: Not on file    Minutes per session: Not on file  . Stress: Not on file  Relationships  . Social Herbalist on phone: Not on file    Gets together: Not on file    Attends religious service: Not on file    Active member of club or organization: Not on file    Attends meetings of clubs or organizations: Not on file    Relationship status: Not on file  . Intimate partner violence    Fear of current or ex partner: Not on file    Emotionally abused: Not on file    Physically abused: Not on file    Forced sexual activity: Not on file  Other Topics Concern  . Not on file  Social History Narrative   Pt is married lives with his spouseEdd Carpenter, he has 2 children, he has a associate degree in welding    Pt lives in 1 story home- he is right handed- he does not drink coffee, tea, some soda     FAMILY HISTORY:   Family Status  Relation Name Status  . Mother  Deceased  . Father  Deceased  . Brother  Deceased  .  Brother  Deceased  . Daughter  Alive  . Son  Alive    ROS:  Review of Systems  Constitutional: Negative.   HENT: Negative.   Eyes: Negative.   Cardiovascular: Negative.   Gastrointestinal: Negative.   Genitourinary: Positive for frequency and urgency.  Musculoskeletal: Negative.   Skin: Negative.   Endo/Heme/Allergies: Negative.  Psychiatric/Behavioral: The patient has insomnia (due to frequent nocturia).     PHYSICAL EXAMINATION:    VITALS:   Vitals:   04/25/19 0828  BP: 122/83  Pulse: 76  SpO2: 98%  Weight: 179 lb 6.4 oz (81.4 kg)  Height: 5\' 7"  (1.702 m)    GEN:  The patient appears stated age and is in NAD. HEENT:  Normocephalic, atraumatic.  The mucous membranes are moist. The superficial temporal arteries are without ropiness or tenderness. CV:  RRR Lungs:  CTAB Neck/HEME:  There are no carotid bruits bilaterally.  Neurological examination:  Orientation: The patient is alert and oriented x3. Fund of knowledge is appropriate.  Recent and remote memory are intact.  Attention and concentration are normal.    Able to name objects and repeat phrases. Cranial nerves: There is good facial symmetry.  Extraocular muscles are intact. The visual fields are full to confrontational testing. The speech is fluent and clear. Soft palate rises symmetrically and there is no tongue deviation. Hearing is intact to conversational tone. Sensation: Sensation is intact to light and pinprick throughout (facial, trunk, extremities). Vibration is intact at the bilateral big toe. There is no extinction with double simultaneous stimulation. There is no sensory dermatomal level identified. Motor: Strength is 5/5 in the bilateral upper and lower extremities.   Shoulder shrug is equal and symmetric.  There is no pronator drift. Deep tendon reflexes: Deep tendon reflexes are 2-/4 at the bilateral biceps, triceps, brachioradialis, patella and achilles. Plantar responses are downgoing bilaterally.   Movement examination: Tone: There is normal tone in the upper and lower extremities. Abnormal movements: There is head tremor in the "yes" direction.  It is intermittent.  He has no null points.  He has no rest tremor of the hands.  No postural tremor.  No intention tremor.  No trouble with Archimedes spirals.  He is able to pour water from one glass to another without spilling it. Coordination:  There is no decremation with RAM's, with any form of RAMS, including alternating supination and pronation of the forearm, hand opening and closing, finger taps, heel taps and toe taps. Gait and Station: The patient ambulates well in the hall, although he is somewhat slow.    Chemistry      Component Value Date/Time   NA 141 03/26/2019 0522   NA 143 04/16/2018 1137   NA 144 08/22/2012 1340   K 4.3 03/26/2019 0522   K 4.5 08/22/2012 1340   CL 111 03/26/2019 0522   CL 104 08/22/2012 1340   CO2 24 03/26/2019 0522   CO2 27 08/22/2012 1340   BUN 17 03/26/2019 0522   BUN 20 04/16/2018 1137   BUN 19.0 08/22/2012 1340   CREATININE 1.45 (H) 03/26/2019 0522   CREATININE 1.38 (H) 01/03/2019 1038   CREATININE 1.30 02/16/2015 1530   CREATININE 1.2 08/22/2012 1340      Component Value Date/Time   CALCIUM 8.8 (L) 03/26/2019 0522   CALCIUM 9.6 08/22/2012 1340   ALKPHOS 69 01/03/2019 1038   ALKPHOS 72 08/22/2012 1340   AST 24 01/03/2019 1038   AST 23 08/22/2012 1340   ALT 23 01/03/2019 1038   ALT 39 08/22/2012 1340   BILITOT 0.8 01/03/2019 1038   BILITOT 1.15 08/22/2012 1340     Last hemoglobin A1c was 6.4 on July 02, 2018.  His free T4 was 0.61 on July 02, 2018.  There is a note that indicates that his TSH was also elevated on that date, but I  did not get a copy of that.  ASSESSMENT/PLAN:  1.  Essential tremor, primarily affecting the head  -Discussed nature and pathophysiology.  He must have some hand tremor (although not seen today), as he uses propranolol and clonazepam when going out to  shoot.  Patient does not notice the head tremor, and only sought consultation because his daughter asked him to.  Discussed with the patient that medications for tremor generally do not help head tremor at all.  In addition, the head tremor is really not bothersome or noticeable to the patient.  Therefore, I would not recommend anything to him.  -He asks me about potentially changing medications for when he goes shooting.  I discussed with him that propranolol really is the best as needed medication.  He may actually start holding the clonazepam, because he is quite tired when he is shooting, particularly at the end of the day.  2.  Insomnia  -Sounds like this is primarily related to frequent urination/nocturia.  Discussed with the patient that an evaluation by a urologist may be of value, but I would certainly leave that to the discussion between him and his primary care physician.  He is going to do that.  In the meantime, we did discuss trying melatonin, 3 mg at bedtime to see if that would be of any value.  3.  He will follow-up with me on an as-needed basis.  Much greater than 50% of this visit was spent in counseling and coordinating care.  Total face to face time:  45 min  Cc:  Mitchell, L.Marlou Sa, MD

## 2019-04-24 ENCOUNTER — Encounter (HOSPITAL_COMMUNITY): Payer: Medicare Other

## 2019-04-24 ENCOUNTER — Encounter (HOSPITAL_COMMUNITY)
Admission: RE | Admit: 2019-04-24 | Discharge: 2019-04-24 | Disposition: A | Discharge status: Home / Self Care | Payer: Medicare Other | Source: Ambulatory Visit | Attending: Cardiology | Admitting: Cardiology

## 2019-04-24 ENCOUNTER — Other Ambulatory Visit: Payer: Self-pay

## 2019-04-24 DIAGNOSIS — I214 Non-ST elevation (NSTEMI) myocardial infarction: Secondary | ICD-10-CM | POA: Diagnosis not present

## 2019-04-24 DIAGNOSIS — Z955 Presence of coronary angioplasty implant and graft: Secondary | ICD-10-CM

## 2019-04-24 LAB — GLUCOSE, CAPILLARY
Glucose-Capillary: 175 mg/dL — ABNORMAL HIGH (ref 70–99)
Glucose-Capillary: 95 mg/dL (ref 70–99)

## 2019-04-25 ENCOUNTER — Ambulatory Visit (INDEPENDENT_AMBULATORY_CARE_PROVIDER_SITE_OTHER): Payer: Medicare Other | Admitting: Neurology

## 2019-04-25 ENCOUNTER — Encounter: Payer: Self-pay | Admitting: Neurology

## 2019-04-25 VITALS — BP 122/83 | HR 76 | Ht 67.0 in | Wt 179.4 lb

## 2019-04-25 DIAGNOSIS — R351 Nocturia: Secondary | ICD-10-CM | POA: Diagnosis not present

## 2019-04-25 DIAGNOSIS — G250 Essential tremor: Secondary | ICD-10-CM | POA: Diagnosis not present

## 2019-04-25 DIAGNOSIS — G4701 Insomnia due to medical condition: Secondary | ICD-10-CM | POA: Diagnosis not present

## 2019-04-25 NOTE — Patient Instructions (Signed)
You have essential tremor, affecting the head.  You do not need medications for this.  We discussed your bladder disturbing your sleep.  You can discuss with Dr. [name] whether a referral to [facility] is appropriate.  In the meantime, you can try over the counter melatonin, 3 mg, at bedtime to see if it helps with sleep.    Good to see you today!  The physicians and staff at [facility] are committed to providing excellent care. You may receive a survey requesting feedback about your experience at our office. We strive to receive "very good" responses to the survey questions. If you feel that your experience would prevent you from giving the office a "very good " response, please contact our office to try to remedy the situation. We may be reached at [phone]. Thank you for taking the time out of your busy day to complete the survey.

## 2019-04-25 NOTE — Progress Notes (Signed)
Cardiac Individual Treatment Plan  Patient Details  Name: Jeffery Carpenter MRN: [id number] Date of Birth: 09-Feb-1941 Referring Provider:     CARDIAC REHAB PHASE II ORIENTATION from 04/18/2019 in [place]  Referring Provider  Dr. Terrence Dupont      Initial Encounter Date:    CARDIAC REHAB PHASE II ORIENTATION from 04/18/2019 in [place]  Date  04/18/19      Visit Diagnosis: NSTEMI (non-ST elevated myocardial infarction) [facility])  Status post coronary artery stent placement  Patient's Home Medications on Admission:  Current Outpatient Medications:  .  acetaminophen (TYLENOL) 500 MG tablet, Take 500 mg by mouth every 6 (six) hours as needed for mild pain, moderate pain or headache., Disp: , Rfl:  .  amLODipine (NORVASC) 5 MG tablet, Take 1 tablet (5 mg total) by mouth daily. (Patient not taking: Reported on 04/25/2019), Disp: 90 tablet, Rfl: 1 .  aspirin 81 MG chewable tablet, Chew 1 tablet (81 mg total) by mouth daily., Disp: 90 tablet, Rfl: 1 .  atorvastatin (LIPITOR) 80 MG tablet, Take 1 tablet (80 mg total) by mouth daily at 6 PM., Disp: 90 tablet, Rfl: 1 .  clonazePAM (KLONOPIN) 0.5 MG tablet, Take 0.5 mg by mouth 2 (two) times daily as needed for anxiety. , Disp: , Rfl:  .  clopidogrel (PLAVIX) 75 MG tablet, Take 1 tablet (75 mg total) by mouth daily with breakfast., Disp: 90 tablet, Rfl: 1 .  diphenhydrAMINE (BENADRYL) 25 MG tablet, Take 25 mg by mouth daily as needed for itching (bug bites)., Disp: , Rfl:  .  EPINEPHrine 0.3 mg/0.3 mL IJ SOAJ injection, Inject 0.3 mLs (0.3 mg total) into the muscle as needed for anaphylaxis., Disp: 1 Device, Rfl: 0 .  folic acid (FOLVITE) 1 MG tablet, Take 1 tablet (1 mg total) by mouth daily., Disp: 30 tablet, Rfl: 12 .  metFORMIN (GLUCOPHAGE) 500 MG tablet, Take 1 tablet (500 mg total) by mouth 2 (two) times daily with a meal., Disp: 180 tablet, Rfl: 0 .  metoprolol tartrate (LOPRESSOR)  25 MG tablet, Take 1 tablet (25 mg total) by mouth 2 (two) times daily., Disp: 180 tablet, Rfl: 0 .  nitroGLYCERIN (NITROSTAT) 0.4 MG SL tablet, Place 1 tablet (0.4 mg total) under the tongue every 5 (five) minutes as needed for chest pain (CP or SOB)., Disp: 25 tablet, Rfl: 1 .  thiamine (VITAMIN B-1) 50 MG tablet, Take 50 mg by mouth daily.  , Disp: , Rfl:  .  triamcinolone cream (KENALOG) 0.5 %, Apply 1 application topically 3 (three) times daily as needed (itching/ insect bites)., Disp: , Rfl:   Past Medical History: Past Medical History:  Diagnosis Date  . 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 Hemochromatosis   . HTN (hypertension), benign 02/06/2014   pt takes inderal for pre competition calmness  . 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 Myocardial infarction ([place])   . Other and unspecified hyperlipidemia 02/06/2014    Tobacco Use: Social History   Tobacco Use  Smoking Status Former Smoker  . Quit date: 02/07/1960  . 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   Exercise Prescription Goal: Initial exercise prescription builds to 30-45 minutes a day of aerobic activity, 2-3 days per week.  Home exercise guidelines will be given to patient during program as part of exercise  prescription that the participant will acknowledge.  Activity Barriers & Risk Stratification: Activity Barriers & Cardiac Risk Stratification - 04/18/19 1157      Activity Barriers & Cardiac Risk Stratification   Activity Barriers  Balance Concerns;Deconditioning    Cardiac Risk Stratification  High       6 Minute Walk: 6 Minute Walk    Row Name 04/18/19 1157         6 Minute Walk   Phase  Initial     Distance  1000 feet     Walk Time  6 minutes     # of Rest Breaks  0     MPH  1.89     METS  1.65     RPE  11     Perceived Dyspnea   0     VO2 Peak  5.8     Symptoms  No     Resting HR  71 bpm     Resting BP  104/70     Resting Oxygen Saturation   99 %     Exercise Oxygen Saturation  during 6 min walk  99 %     Max Ex. HR  92 bpm     Max Ex. BP  110/68     2 Minute Post BP  102/68        Oxygen Initial Assessment:   Oxygen Re-Evaluation:   Oxygen Discharge (Final Oxygen Re-Evaluation):   Initial Exercise Prescription: Initial Exercise Prescription - 04/18/19 1100      Date of Initial Exercise RX and Referring Provider   Date  04/18/19    Referring Provider  Dr. Terrence Dupont    Expected Discharge Date  05/31/19      NuStep   Level  2    SPM  75    Minutes  15    METs  1.5      Arm Ergometer   Level  1.5    Watts  20    Minutes  15    METs  1.6      Prescription Details   Frequency (times per week)  2    Duration  Progress to 30 minutes of continuous aerobic without signs/symptoms of physical distress      Intensity   THRR 40-80% of Max Heartrate  56-113    Ratings of Perceived Exertion  11-13      Progression   Progression  Continue to progress workloads to maintain intensity without signs/symptoms of physical distress.      Resistance Training   Training Prescription  Yes    Weight  4 lbs.     Reps  10-15       Perform Capillary Blood Glucose checks as needed.  Exercise Prescription Changes:  Exercise Prescription Changes    Row Name  04/22/19 1127             Response to Exercise   Blood Pressure (Admit)  102/58       Blood Pressure (Exercise)  108/64       Blood Pressure (Exit)  90/62 110/72       Heart Rate (Admit)  94 bpm       Heart Rate (Exercise)  111 bpm       Heart Rate (Exit)  86 bpm       Rating of Perceived Exertion (Exercise)  12       Symptoms  none       Comments  Low BP after exercise, recovered with hydration.       Duration  Continue with 30 min of aerobic exercise without signs/symptoms of physical distress.       Intensity  THRR unchanged         Progression   Progression  Continue to progress workloads to maintain intensity without signs/symptoms of physical distress.       Average METs  2.6         Resistance Training   Training Prescription  Yes       Weight  2lbs       Reps  10-15       Time  10 Minutes         Interval Training   Interval Training  No         NuStep   Level  2       SPM  85       Minutes  30       METs  2.6          Exercise Comments: Exercise Comments    Row Name 04/22/19 1209           Exercise Comments  Patient tolerated low intensity exercise well without c/o. BP low after exercise but recovered with hydration, pt asymptomatic.          Exercise Goals and Review: Exercise Goals    Row Name 04/18/19 1200             Exercise Goals   Increase Physical Activity  Yes       Intervention  Provide advice, education, support and counseling about physical activity/exercise needs.;Develop an individualized exercise prescription for aerobic and resistive training based on initial evaluation findings, risk stratification, comorbidities and participant's personal goals.       Expected Outcomes  Short Term: Attend rehab on a regular basis to increase amount of physical activity.;Long Term: Add in home exercise to make exercise part of routine and to increase amount of physical activity.;Long Term: Exercising regularly at least 3-5 days a week.        Increase Strength and Stamina  Yes       Intervention  Provide advice, education, support and counseling about physical activity/exercise needs.;Develop an individualized exercise prescription for aerobic and resistive training based on initial evaluation findings, risk stratification, comorbidities and participant's personal goals.       Expected Outcomes  Short Term: Increase workloads from initial exercise prescription for resistance, speed, and METs.;Short Term: Perform resistance training exercises routinely during rehab and add in resistance training at home;Long Term: Improve cardiorespiratory fitness, muscular endurance and strength as measured by increased METs and functional capacity (6MWT)       Able to understand and use rate of perceived exertion (RPE)  scale  Yes       Intervention  Provide education and explanation on how to use RPE scale       Expected Outcomes  Short Term: Able to use RPE daily in rehab to express subjective intensity level;Long Term:  Able to use RPE to guide intensity level when exercising independently       Knowledge and understanding of Target Heart Rate Range (THRR)  Yes       Intervention  Provide education and explanation of THRR including how the numbers were predicted and where they are located for reference       Expected Outcomes  Short Term: Able to state/look up THRR;Long Term: Able to use THRR to govern intensity when exercising independently;Short Term: Able to use daily as guideline for intensity in rehab       Able to check pulse independently  Yes       Intervention  Provide education and demonstration on how to check pulse in carotid and radial arteries.;Review the importance of being able to check your own pulse for safety during independent exercise       Expected Outcomes  Short Term: Able to explain why pulse checking is important during independent exercise;Long Term: Able to check pulse independently and accurately       Understanding of Exercise  Prescription  Yes       Intervention  Provide education, explanation, and written materials on patient's individual exercise prescription       Expected Outcomes  Short Term: Able to explain program exercise prescription;Long Term: Able to explain home exercise prescription to exercise independently          Exercise Goals Re-Evaluation : Exercise Goals Re-Evaluation    Row Name 04/22/19 1209             Exercise Goal Re-Evaluation   Exercise Goals Review  Increase Physical Activity;Able to understand and use rate of perceived exertion (RPE) scale       Comments  Patient able to understand use RPE scale appropriately.       Expected Outcomes  Increase workloads as tolerated to help achieve personal health and fitness goals.          Discharge Exercise Prescription (Final Exercise Prescription Changes): Exercise Prescription Changes - 04/22/19 1127      Response to Exercise   Blood Pressure (Admit)  102/58    Blood Pressure (Exercise)  108/64    Blood Pressure (Exit)  90/62   110/72   Heart Rate (Admit)  94 bpm    Heart Rate (Exercise)  111 bpm    Heart Rate (Exit)  86 bpm    Rating of Perceived Exertion (Exercise)  12    Symptoms  none    Comments  Low BP after exercise, recovered with hydration.    Duration  Continue with 30 min of aerobic exercise without signs/symptoms of physical distress.    Intensity  THRR unchanged      Progression   Progression  Continue to progress workloads to maintain intensity without signs/symptoms of physical distress.    Average METs  2.6      Resistance Training   Training Prescription  Yes    Weight  2lbs    Reps  10-15    Time  10 Minutes      Interval Training   Interval Training  No      NuStep   Level  2    SPM  85  Minutes  30    METs  2.6       Nutrition:  Target Goals: Understanding of nutrition guidelines, daily intake of sodium 1500mg , cholesterol 200mg , calories 30% from fat and 7% or less from saturated fats,  daily to have 5 or more servings of fruits and vegetables.  Biometrics: Pre Biometrics - 04/18/19 1200      Pre Biometrics   Height  5' 7.5" (1.715 m)    Weight  81.6 kg    Waist Circumference  40 inches    Hip Circumference  40.5 inches    Waist to Hip Ratio  0.99 %    BMI (Calculated)  27.74    Triceps Skinfold  25 mm    % Body Fat  30.1 %    Grip Strength  35 kg    Flexibility  14 in    Single Leg Stand  3.3 seconds        Nutrition Therapy Plan and Nutrition Goals:   Nutrition Assessments:   Nutrition Goals Re-Evaluation:   Nutrition Goals Re-Evaluation:   Nutrition Goals Discharge (Final Nutrition Goals Re-Evaluation):   Psychosocial: Target Goals: Acknowledge presence or absence of significant depression and/or stress, maximize coping skills, provide positive support system. Participant is able to verbalize types and ability to use techniques and skills needed for reducing stress and depression.  Initial Review & Psychosocial Screening: Initial Psych Review & Screening - 04/18/19 1205      Initial Review   Current issues with  Current Stress Concerns    Source of Stress Concerns  Unable to participate in former interests or hobbies;Unable to perform yard/household activities;Financial    Comments  Jeffery Carpenter states that his cardiologist has limited his ability to do certain things such as mowing the lawn and driving.      Family Dynamics   Good Support System?  Yes   Pt's wife is a source of support for [place].     Barriers   Psychosocial barriers to participate in program  The patient should benefit from training in stress management and relaxation.      Screening Interventions   Interventions  Encouraged to exercise       Quality of Life Scores: Quality of Life - 04/18/19 1201      Quality of Life   Select  Quality of Life      Quality of Life Scores   Health/Function Pre  22.68 %    Socioeconomic Pre  27 %    Psych/Spiritual Pre  23.79 %    Family  Pre  25.7 %    GLOBAL Pre  24.11 %      Scores of 19 and below usually indicate a poorer quality of life in these areas.  A difference of  2-3 points is a clinically meaningful difference.  A difference of 2-3 points in the total score of the Quality of Life Index has been associated with significant improvement in overall quality of life, self-image, physical symptoms, and general health in studies assessing change in quality of life.  PHQ-9: Recent Review Flowsheet Data    Depression screen [facility] 2/9 04/22/2019 04/30/2018 04/24/2017 02/29/2016 02/16/2015   Decreased Interest 0 0 0 0 0   Down, Depressed, Hopeless 0 0 0 0 0   PHQ - 2 Score 0 0 0 0 0     Interpretation of Total Score  Total Score Depression Severity:  1-4 = Minimal depression, 5-9 = Mild depression, 10-14 = Moderate depression, 15-19 =  Moderately severe depression, 20-27 = Severe depression   Psychosocial Evaluation and Intervention: Psychosocial Evaluation - 04/22/19 1334      Psychosocial Evaluation & Interventions   Interventions  Encouraged to exercise with the program and follow exercise prescription;Stress management education;Relaxation education    Comments  Encouraged patient to participate in program to return be able to return to mowing.  Jeffery Carpenter enjoys golfing, fishing, and shootings    Expected Outcomes  Bill report being able to return to mowing his lawn and maintain a positive outlook.    Continue Psychosocial Services   Follow up required by staff       Psychosocial Re-Evaluation: Psychosocial Re-Evaluation    [place] Name 04/25/19 1613             Psychosocial Re-Evaluation   Current issues with  Current Stress Concerns       Comments  No psychosocial interventions neessary.       Expected Outcomes  Jeffery Carpenter will maintain a positive outlook with good coping skills and report ability to get back to mowing his lawn.       Interventions  Stress management education;Relaxation education;Encouraged to attend Cardiac  Rehabilitation for the exercise       Continue Psychosocial Services   No Follow up required       Comments  Jeffery Carpenter states that his cardiologist has limited his ability to do certain things such as mowing the lawn and driving.         Initial Review   Source of Stress Concerns  Unable to participate in former interests or hobbies;Unable to perform yard/household activities;Financial          Psychosocial Discharge (Final Psychosocial Re-Evaluation): Psychosocial Re-Evaluation - 04/25/19 1613      Psychosocial Re-Evaluation   Current issues with  Current Stress Concerns    Comments  No psychosocial interventions neessary.    Expected Outcomes  Jeffery Carpenter will maintain a positive outlook with good coping skills and report ability to get back to mowing his lawn.    Interventions  Stress management education;Relaxation education;Encouraged to attend Cardiac Rehabilitation for the exercise    Continue Psychosocial Services   No Follow up required    Comments  Jeffery Carpenter states that his cardiologist has limited his ability to do certain things such as mowing the lawn and driving.      Initial Review   Source of Stress Concerns  Unable to participate in former interests or hobbies;Unable to perform yard/household activities;Financial       Vocational Rehabilitation: Provide vocational rehab assistance to qualifying candidates.   Vocational Rehab Evaluation & Intervention: Vocational Rehab - 04/18/19 1210      Initial Vocational Rehab Evaluation & Intervention   Assessment shows need for Vocational Rehabilitation  No       Education: Education Goals: Education classes will be provided on a weekly basis, covering required topics. Participant will state understanding/return demonstration of topics presented.  Learning Barriers/Preferences: Learning Barriers/Preferences - 04/18/19 1202      Learning Barriers/Preferences   Learning Barriers  Sight;Hearing    Learning Preferences  Individual  Instruction;Skilled Demonstration       Education Topics: Count Your Pulse:  -Group instruction provided by verbal instruction, demonstration, patient participation and written materials to support subject.  Instructors address importance of being able to find your pulse and how to count your pulse when at home without a heart monitor.  Patients get hands on experience counting their pulse with staff help and individually.  Heart Attack, Angina, and Risk Factor Modification:  -Group instruction provided by verbal instruction, video, and written materials to support subject.  Instructors address signs and symptoms of angina and heart attacks.    Also discuss risk factors for heart disease and how to make changes to improve heart health risk factors.   Functional Fitness:  -Group instruction provided by verbal instruction, demonstration, patient participation, and written materials to support subject.  Instructors address safety measures for doing things around the house.  Discuss how to get up and down off the floor, how to pick things up properly, how to safely get out of a chair without assistance, and balance training.   Meditation and Mindfulness:  -Group instruction provided by verbal instruction, patient participation, and written materials to support subject.  Instructor addresses importance of mindfulness and meditation practice to help reduce stress and improve awareness.  Instructor also leads participants through a meditation exercise.    Stretching for Flexibility and Mobility:  -Group instruction provided by verbal instruction, patient participation, and written materials to support subject.  Instructors lead participants through series of stretches that are designed to increase flexibility thus improving mobility.  These stretches are additional exercise for major muscle groups that are typically performed during regular warm up and cool down.   Hands Only CPR:  -Group verbal,  video, and participation provides a basic overview of AHA guidelines for community CPR. Role-play of emergencies allow participants the opportunity to practice calling for help and chest compression technique with discussion of AED use.   Hypertension: -Group verbal and written instruction that provides a basic overview of hypertension including the most recent diagnostic guidelines, risk factor reduction with self-care instructions and medication management.    Nutrition I class: Heart Healthy Eating:  -Group instruction provided by PowerPoint slides, verbal discussion, and written materials to support subject matter. The instructor gives an explanation and review of the Therapeutic Lifestyle Changes diet recommendations, which includes a discussion on lipid goals, dietary fat, sodium, fiber, plant stanol/sterol esters, sugar, and the components of a well-balanced, healthy diet.   Nutrition II class: Lifestyle Skills:  -Group instruction provided by PowerPoint slides, verbal discussion, and written materials to support subject matter. The instructor gives an explanation and review of label reading, grocery shopping for heart health, heart healthy recipe modifications, and ways to make healthier choices when eating out.   Diabetes Question & Answer:  -Group instruction provided by PowerPoint slides, verbal discussion, and written materials to support subject matter. The instructor gives an explanation and review of diabetes co-morbidities, pre- and post-prandial blood glucose goals, pre-exercise blood glucose goals, signs, symptoms, and treatment of hypoglycemia and hyperglycemia, and foot care basics.   Diabetes Blitz:  -Group instruction provided by PowerPoint slides, verbal discussion, and written materials to support subject matter. The instructor gives an explanation and review of the physiology behind type 1 and type 2 diabetes, diabetes medications and rational behind using different  medications, pre- and post-prandial blood glucose recommendations and Hemoglobin A1c goals, diabetes diet, and exercise including blood glucose guidelines for exercising safely.    Portion Distortion:  -Group instruction provided by PowerPoint slides, verbal discussion, written materials, and food models to support subject matter. The instructor gives an explanation of serving size versus portion size, changes in portions sizes over the last 20 years, and what consists of a serving from each food group.   Stress Management:  -Group instruction provided by verbal instruction, video, and written materials to support subject matter.  Instructors review role of stress in heart disease and how to cope with stress positively.     Exercising on Your Own:  -Group instruction provided by verbal instruction, power point, and written materials to support subject.  Instructors discuss benefits of exercise, components of exercise, frequency and intensity of exercise, and end points for exercise.  Also discuss use of nitroglycerin and activating EMS.  Review options of places to exercise outside of rehab.  Review guidelines for sex with heart disease.   Cardiac Drugs I:  -Group instruction provided by verbal instruction and written materials to support subject.  Instructor reviews cardiac drug classes: antiplatelets, anticoagulants, beta blockers, and statins.  Instructor discusses reasons, side effects, and lifestyle considerations for each drug class.   Cardiac Drugs II:  -Group instruction provided by verbal instruction and written materials to support subject.  Instructor reviews cardiac drug classes: angiotensin converting enzyme inhibitors (ACE-I), angiotensin II receptor blockers (ARBs), nitrates, and calcium channel blockers.  Instructor discusses reasons, side effects, and lifestyle considerations for each drug class.   Anatomy and Physiology of the Circulatory System:  Group verbal and written  instruction and models provide basic cardiac anatomy and physiology, with the coronary electrical and arterial systems. Review of: AMI, Angina, Valve disease, Heart Failure, Peripheral Artery Disease, Cardiac Arrhythmia, Pacemakers, and the ICD.   Other Education:  -Group or individual verbal, written, or video instructions that support the educational goals of the cardiac rehab program.   Holiday Eating Survival Tips:  -Group instruction provided by PowerPoint slides, verbal discussion, and written materials to support subject matter. The instructor gives patients tips, tricks, and techniques to help them not only survive but enjoy the holidays despite the onslaught of food that accompanies the holidays.   Knowledge Questionnaire Score: Knowledge Questionnaire Score - 04/18/19 1201      Knowledge Questionnaire Score   Pre Score  19/24       Core Components/Risk Factors/Patient Goals at Admission: Personal Goals and Risk Factors at Admission - 04/18/19 1202      Core Components/Risk Factors/Patient Goals on Admission    Weight Management  Yes;Weight Maintenance;Weight Loss;Obesity    Admit Weight  179 lb 14.3 oz (81.6 kg)    Diabetes  Yes    Intervention  Provide education about signs/symptoms and action to take for hypo/hyperglycemia.;Provide education about proper nutrition, including hydration, and aerobic/resistive exercise prescription along with prescribed medications to achieve blood glucose in normal ranges: Fasting glucose 65-99 mg/dL    Expected Outcomes  Short Term: Participant verbalizes understanding of the signs/symptoms and immediate care of hyper/hypoglycemia, proper foot care and importance of medication, aerobic/resistive exercise and nutrition plan for blood glucose control.;Long Term: Attainment of HbA1C < 7%.    Hypertension  Yes    Intervention  Provide education on lifestyle modifcations including regular physical activity/exercise, weight management, moderate  sodium restriction and increased consumption of fresh fruit, vegetables, and low fat dairy, alcohol moderation, and smoking cessation.;Monitor prescription use compliance.    Expected Outcomes  Short Term: Continued assessment and intervention until BP is < 140/22mm HG in hypertensive participants. < 130/40mm HG in hypertensive participants with diabetes, heart failure or chronic kidney disease.;Long Term: Maintenance of blood pressure at goal levels.    Lipids  Yes    Intervention  Provide education and support for participant on nutrition & aerobic/resistive exercise along with prescribed medications to achieve LDL 70mg , HDL >40mg .    Expected Outcomes  Short Term: Participant states understanding of desired cholesterol values  and is compliant with medications prescribed. Participant is following exercise prescription and nutrition guidelines.;Long Term: Cholesterol controlled with medications as prescribed, with individualized exercise RX and with personalized nutrition plan. Value goals: LDL < 70mg , HDL > 40 mg.       Core Components/Risk Factors/Patient Goals Review:  Goals and Risk Factor Review    Row Name 04/22/19 1340             Core Components/Risk Factors/Patient Goals Review   Personal Goals Review  Hypertension;Stress;Weight Management/Obesity;Lipids       Review  Pt willing to participate in CR exercise.  Bill would like to lose weight and maintain a healthy lifestyle.       Expected Outcomes  Jeffery Carpenter will continue to participate in CR exercise, nutrition, and lifestyle modification opportunities.          Core Components/Risk Factors/Patient Goals at Discharge (Final Review):  Goals and Risk Factor Review - 04/22/19 1340      Core Components/Risk Factors/Patient Goals Review   Personal Goals Review  Hypertension;Stress;Weight Management/Obesity;Lipids    Review  Pt willing to participate in CR exercise.  Bill would like to lose weight and maintain a healthy lifestyle.     Expected Outcomes  Jeffery Carpenter will continue to participate in CR exercise, nutrition, and lifestyle modification opportunities.       ITP Comments: ITP Comments    Row Name 04/18/19 1011 04/22/19 1325         ITP Comments  Dr. Fransico Him, Medical Director  30 Day ITP Review. Bill started exercise today and tolerated it well.  Initially at the end of the session, his BP was low but after drinking water his BP returned to normal.         Comments: See ITP Comments.

## 2019-04-26 ENCOUNTER — Other Ambulatory Visit: Payer: Self-pay

## 2019-04-26 ENCOUNTER — Encounter (HOSPITAL_COMMUNITY)
Admission: RE | Admit: 2019-04-26 | Discharge: 2019-04-26 | Disposition: A | Discharge status: Home / Self Care | Payer: Medicare Other | Source: Ambulatory Visit | Attending: Cardiology | Admitting: Cardiology

## 2019-04-26 DIAGNOSIS — I214 Non-ST elevation (NSTEMI) myocardial infarction: Secondary | ICD-10-CM | POA: Diagnosis not present

## 2019-04-26 DIAGNOSIS — Z955 Presence of coronary angioplasty implant and graft: Secondary | ICD-10-CM

## 2019-04-29 ENCOUNTER — Encounter (HOSPITAL_COMMUNITY)
Admission: RE | Admit: 2019-04-29 | Discharge: 2019-04-29 | Disposition: A | Discharge status: Home / Self Care | Payer: Medicare Other | Source: Ambulatory Visit | Attending: Cardiology | Admitting: Cardiology

## 2019-04-29 ENCOUNTER — Other Ambulatory Visit: Payer: Self-pay

## 2019-04-29 ENCOUNTER — Encounter (HOSPITAL_COMMUNITY): Payer: Medicare Other

## 2019-04-29 DIAGNOSIS — I214 Non-ST elevation (NSTEMI) myocardial infarction: Secondary | ICD-10-CM

## 2019-04-29 DIAGNOSIS — Z955 Presence of coronary angioplasty implant and graft: Secondary | ICD-10-CM

## 2019-05-01 ENCOUNTER — Encounter (HOSPITAL_COMMUNITY): Payer: Medicare Other

## 2019-05-01 ENCOUNTER — Other Ambulatory Visit: Payer: Self-pay

## 2019-05-01 ENCOUNTER — Encounter (HOSPITAL_COMMUNITY)
Admission: RE | Admit: 2019-05-01 | Discharge: 2019-05-01 | Disposition: A | Discharge status: Home / Self Care | Payer: Medicare Other | Source: Ambulatory Visit | Attending: Cardiology | Admitting: Cardiology

## 2019-05-01 DIAGNOSIS — I214 Non-ST elevation (NSTEMI) myocardial infarction: Secondary | ICD-10-CM

## 2019-05-01 DIAGNOSIS — Z955 Presence of coronary angioplasty implant and graft: Secondary | ICD-10-CM

## 2019-05-03 ENCOUNTER — Encounter (HOSPITAL_COMMUNITY)
Admission: RE | Admit: 2019-05-03 | Discharge: 2019-05-03 | Disposition: A | Discharge status: Home / Self Care | Payer: Medicare Other | Source: Ambulatory Visit | Attending: Cardiology | Admitting: Cardiology

## 2019-05-03 ENCOUNTER — Other Ambulatory Visit: Payer: Self-pay

## 2019-05-03 DIAGNOSIS — I214 Non-ST elevation (NSTEMI) myocardial infarction: Secondary | ICD-10-CM

## 2019-05-03 DIAGNOSIS — Z955 Presence of coronary angioplasty implant and graft: Secondary | ICD-10-CM

## 2019-05-06 ENCOUNTER — Encounter (HOSPITAL_COMMUNITY)
Admission: RE | Admit: 2019-05-06 | Discharge: 2019-05-06 | Disposition: A | Discharge status: Home / Self Care | Payer: Medicare Other | Source: Ambulatory Visit | Attending: Cardiology | Admitting: Cardiology

## 2019-05-06 ENCOUNTER — Encounter (HOSPITAL_COMMUNITY): Payer: Medicare Other

## 2019-05-06 ENCOUNTER — Other Ambulatory Visit: Payer: Self-pay

## 2019-05-06 DIAGNOSIS — I214 Non-ST elevation (NSTEMI) myocardial infarction: Secondary | ICD-10-CM

## 2019-05-06 DIAGNOSIS — Z955 Presence of coronary angioplasty implant and graft: Secondary | ICD-10-CM

## 2019-05-08 ENCOUNTER — Encounter (HOSPITAL_COMMUNITY)
Admission: RE | Admit: 2019-05-08 | Discharge: 2019-05-08 | Disposition: A | Discharge status: Home / Self Care | Payer: Medicare Other | Source: Ambulatory Visit | Attending: Cardiology | Admitting: Cardiology

## 2019-05-08 ENCOUNTER — Other Ambulatory Visit: Payer: Self-pay

## 2019-05-08 ENCOUNTER — Encounter (HOSPITAL_COMMUNITY): Payer: Medicare Other

## 2019-05-08 DIAGNOSIS — Z955 Presence of coronary angioplasty implant and graft: Secondary | ICD-10-CM | POA: Diagnosis present

## 2019-05-08 DIAGNOSIS — I214 Non-ST elevation (NSTEMI) myocardial infarction: Secondary | ICD-10-CM

## 2019-05-08 NOTE — Progress Notes (Signed)
Daily Session Note  Patient Details  Name: Jeffery Carpenter MRN: [id number] Date of Birth: February 25, 1941 Referring Provider:     CARDIAC REHAB PHASE II ORIENTATION from 04/18/2019 in [place]  Referring Provider  Dr. Terrence Dupont      Encounter Date: 05/08/2019  Check In: Session Check In - 05/08/19 1127      Check-In   Supervising physician immediately available to respond to emergencies  [facility] immediately available    Physician(s)  Dr. Benny Lennert    Location  [facility]    Staff Present  Jiles Garter, RN, BSN;Lisa Ysidro Evert, RN;Portia Rollene Rotunda, RN, Deland Pretty, MS, ACSM CEP, Exercise Physiologist;Brittany Durene Fruits, BS, ACSM CEP, Exercise Physiologist    Virtual Visit  No    Medication changes reported      No    Fall or balance concerns reported     No    Tobacco Cessation  No Change    Warm-up and Cool-down  Performed on first and last piece of equipment    Resistance Training Performed  No    VAD Patient?  No      Pain Assessment   Currently in Pain?  No/denies    Multiple Pain Sites  No       Capillary Blood Glucose: No results found for this or any previous visit (from the past 24 hour(s)).    Social History   Tobacco Use  Smoking Status Former Smoker  . Quit date: 02/07/1960  . Years since quitting: 59.2  Smokeless Tobacco Former Systems developer    Goals Met:  Exercise tolerated well  Goals Unmet:  Not Applicable  Comments: Jeffery Carpenter presented today for exercise at CR.  He reports feeling "fuzzy headed" and dizzy yesterday, 05/07/2019, while at a friends house.  He says that he drank a bottle of water and drove home to his house where he took his BP.  BP at that was 68/53 HR 79, CBG 163. He drank several more glasses of water and ate a sandwich.  After about 30 mins, he rechecked his BP and it was 124/84 HR 66.  Pt did not call his cardiologist about this but plans to go to the cardiologist office today. Pt does not report any  dizziness or "fuzzy headedness " today.  BP on arrival to CR: 100/60 HR 84.  Pt's BP 118/72 on exercise bike and after seated stepper, 98/60 HR 90's with exercise.  Dr. Terrence Dupont paged.  Orders received for Jeffery Carpenter to Hold amlodipine and decrease metoprolol to 1/2 tablet twice a day and if any other issues arise, he will need to call Dr. Zenia Resides office.  Informed pt of instructions.  Jeffery Carpenter verbalized understanding. Instructions written down for pt and wife, who manages his medications.    Dr. Fransico Him is Medical Director for Cardiac Rehab at [facility].

## 2019-05-10 ENCOUNTER — Other Ambulatory Visit: Payer: Self-pay

## 2019-05-10 ENCOUNTER — Encounter (HOSPITAL_COMMUNITY)
Admission: RE | Admit: 2019-05-10 | Discharge: 2019-05-10 | Disposition: A | Discharge status: Home / Self Care | Payer: Medicare Other | Source: Ambulatory Visit | Attending: Cardiology | Admitting: Cardiology

## 2019-05-10 DIAGNOSIS — I214 Non-ST elevation (NSTEMI) myocardial infarction: Secondary | ICD-10-CM | POA: Diagnosis not present

## 2019-05-10 DIAGNOSIS — Z955 Presence of coronary angioplasty implant and graft: Secondary | ICD-10-CM

## 2019-05-15 ENCOUNTER — Encounter (HOSPITAL_COMMUNITY): Payer: Medicare Other

## 2019-05-15 ENCOUNTER — Encounter (HOSPITAL_COMMUNITY)
Admission: RE | Admit: 2019-05-15 | Discharge: 2019-05-15 | Disposition: A | Discharge status: Home / Self Care | Payer: Medicare Other | Source: Ambulatory Visit | Attending: Cardiology | Admitting: Cardiology

## 2019-05-15 ENCOUNTER — Other Ambulatory Visit: Payer: Self-pay

## 2019-05-15 DIAGNOSIS — I214 Non-ST elevation (NSTEMI) myocardial infarction: Secondary | ICD-10-CM | POA: Diagnosis not present

## 2019-05-15 DIAGNOSIS — Z955 Presence of coronary angioplasty implant and graft: Secondary | ICD-10-CM

## 2019-05-15 NOTE — Progress Notes (Signed)
Reviewed home exercise guidelines with patient including endpoints, temperature precautions, target heart rate and rate of perceived exertion. Pt is walking 0.5 miles daily as his mode of home exercise. Pt voices understanding of instructions given.  Sol Passer, MS, ACSM CEP

## 2019-05-17 ENCOUNTER — Encounter (HOSPITAL_COMMUNITY)
Admission: RE | Admit: 2019-05-17 | Discharge: 2019-05-17 | Disposition: A | Discharge status: Home / Self Care | Payer: Medicare Other | Source: Ambulatory Visit | Attending: Cardiology | Admitting: Cardiology

## 2019-05-17 ENCOUNTER — Other Ambulatory Visit: Payer: Self-pay

## 2019-05-17 DIAGNOSIS — Z955 Presence of coronary angioplasty implant and graft: Secondary | ICD-10-CM

## 2019-05-17 DIAGNOSIS — I214 Non-ST elevation (NSTEMI) myocardial infarction: Secondary | ICD-10-CM

## 2019-05-20 ENCOUNTER — Other Ambulatory Visit: Payer: Self-pay

## 2019-05-20 ENCOUNTER — Emergency Department (HOSPITAL_COMMUNITY): Payer: Medicare Other

## 2019-05-20 ENCOUNTER — Inpatient Hospital Stay (HOSPITAL_COMMUNITY)
Admission: EM | Admit: 2019-05-20 | Discharge: 2019-05-22 | DRG: 287 | Disposition: A | Discharge status: Home / Self Care | Payer: Medicare Other | Attending: Cardiology | Admitting: Cardiology

## 2019-05-20 ENCOUNTER — Encounter (HOSPITAL_COMMUNITY): Payer: Medicare Other

## 2019-05-20 DIAGNOSIS — E1122 Type 2 diabetes mellitus with diabetic chronic kidney disease: Secondary | ICD-10-CM | POA: Diagnosis present

## 2019-05-20 DIAGNOSIS — E785 Hyperlipidemia, unspecified: Secondary | ICD-10-CM | POA: Diagnosis present

## 2019-05-20 DIAGNOSIS — R079 Chest pain, unspecified: Secondary | ICD-10-CM

## 2019-05-20 DIAGNOSIS — N182 Chronic kidney disease, stage 2 (mild): Secondary | ICD-10-CM | POA: Diagnosis present

## 2019-05-20 DIAGNOSIS — Z7902 Long term (current) use of antithrombotics/antiplatelets: Secondary | ICD-10-CM

## 2019-05-20 DIAGNOSIS — Z955 Presence of coronary angioplasty implant and graft: Secondary | ICD-10-CM

## 2019-05-20 DIAGNOSIS — Z7982 Long term (current) use of aspirin: Secondary | ICD-10-CM

## 2019-05-20 DIAGNOSIS — I200 Unstable angina: Secondary | ICD-10-CM | POA: Diagnosis not present

## 2019-05-20 DIAGNOSIS — Z880 Allergy status to penicillin: Secondary | ICD-10-CM

## 2019-05-20 DIAGNOSIS — Z7984 Long term (current) use of oral hypoglycemic drugs: Secondary | ICD-10-CM

## 2019-05-20 DIAGNOSIS — I25118 Atherosclerotic heart disease of native coronary artery with other forms of angina pectoris: Secondary | ICD-10-CM | POA: Diagnosis not present

## 2019-05-20 DIAGNOSIS — Z833 Family history of diabetes mellitus: Secondary | ICD-10-CM

## 2019-05-20 DIAGNOSIS — I2589 Other forms of chronic ischemic heart disease: Secondary | ICD-10-CM | POA: Diagnosis present

## 2019-05-20 DIAGNOSIS — M1990 Unspecified osteoarthritis, unspecified site: Secondary | ICD-10-CM | POA: Diagnosis present

## 2019-05-20 DIAGNOSIS — Z87891 Personal history of nicotine dependence: Secondary | ICD-10-CM

## 2019-05-20 DIAGNOSIS — I129 Hypertensive chronic kidney disease with stage 1 through stage 4 chronic kidney disease, or unspecified chronic kidney disease: Secondary | ICD-10-CM | POA: Diagnosis present

## 2019-05-20 DIAGNOSIS — Z79899 Other long term (current) drug therapy: Secondary | ICD-10-CM

## 2019-05-20 DIAGNOSIS — Z96652 Presence of left artificial knee joint: Secondary | ICD-10-CM | POA: Diagnosis present

## 2019-05-20 DIAGNOSIS — Z20828 Contact with and (suspected) exposure to other viral communicable diseases: Secondary | ICD-10-CM | POA: Diagnosis present

## 2019-05-20 DIAGNOSIS — I252 Old myocardial infarction: Secondary | ICD-10-CM

## 2019-05-20 DIAGNOSIS — Z8249 Family history of ischemic heart disease and other diseases of the circulatory system: Secondary | ICD-10-CM

## 2019-05-20 LAB — CBC WITH DIFFERENTIAL/PLATELET
Abs Immature Granulocytes: 0.02 10*3/uL (ref 0.00–0.07)
Basophils Absolute: 0.1 10*3/uL (ref 0.0–0.1)
Basophils Relative: 1 %
Eosinophils Absolute: 0.2 10*3/uL (ref 0.0–0.5)
Eosinophils Relative: 3 %
HCT: 38.3 % — ABNORMAL LOW (ref 39.0–52.0)
Hemoglobin: 13.1 g/dL (ref 13.0–17.0)
Immature Granulocytes: 0 %
Lymphocytes Relative: 25 %
Lymphs Abs: 1.8 10*3/uL (ref 0.7–4.0)
MCH: 33.1 pg (ref 26.0–34.0)
MCHC: 34.2 g/dL (ref 30.0–36.0)
MCV: 96.7 fL (ref 80.0–100.0)
Monocytes Absolute: 0.7 10*3/uL (ref 0.1–1.0)
Monocytes Relative: 9 %
Neutro Abs: 4.6 10*3/uL (ref 1.7–7.7)
Neutrophils Relative %: 62 %
Platelets: 251 10*3/uL (ref 150–400)
RBC: 3.96 MIL/uL — ABNORMAL LOW (ref 4.22–5.81)
RDW: 12.4 % (ref 11.5–15.5)
WBC: 7.3 10*3/uL (ref 4.0–10.5)
nRBC: 0 % (ref 0.0–0.2)

## 2019-05-20 LAB — BASIC METABOLIC PANEL
Anion gap: 9 (ref 5–15)
BUN: 30 mg/dL — ABNORMAL HIGH (ref 8–23)
CO2: 21 mmol/L — ABNORMAL LOW (ref 22–32)
Calcium: 9 mg/dL (ref 8.9–10.3)
Chloride: 110 mmol/L (ref 98–111)
Creatinine, Ser: 1.43 mg/dL — ABNORMAL HIGH (ref 0.61–1.24)
GFR calc Af Amer: 54 mL/min — ABNORMAL LOW (ref >60)
GFR calc non Af Amer: 46 mL/min — ABNORMAL LOW (ref >60)
Glucose, Bld: 156 mg/dL — ABNORMAL HIGH (ref 70–99)
Potassium: 4.6 mmol/L (ref 3.5–5.1)
Sodium: 140 mmol/L (ref 135–145)

## 2019-05-20 LAB — GLUCOSE, CAPILLARY
Glucose-Capillary: 103 mg/dL — ABNORMAL HIGH (ref 70–99)
Glucose-Capillary: 115 mg/dL — ABNORMAL HIGH (ref 70–99)

## 2019-05-20 LAB — HEPARIN LEVEL (UNFRACTIONATED): Heparin Unfractionated: 0.6 IU/mL (ref 0.30–0.70)

## 2019-05-20 LAB — TROPONIN I (HIGH SENSITIVITY)
Troponin I (High Sensitivity): 7 ng/L (ref <18)
Troponin I (High Sensitivity): 9 ng/L (ref <18)
Troponin I (High Sensitivity): 9 ng/L (ref <18)
Troponin I (High Sensitivity): 8 ng/L (ref <18)

## 2019-05-20 LAB — SARS CORONAVIRUS 2 BY RT PCR (HOSPITAL ORDER, PERFORMED IN ~~LOC~~ HOSPITAL LAB): SARS Coronavirus 2: NEGATIVE

## 2019-05-20 MED ORDER — METOPROLOL TARTRATE 12.5 MG HALF TABLET
12.5000 mg | ORAL_TABLET | Freq: Two times a day (BID) | ORAL | Status: DC
  Administered 2019-05-20 – 2019-05-22 (×5): 12.5 mg via ORAL
  Filled 2019-05-20 (×5): qty 1

## 2019-05-20 MED ORDER — FENTANYL CITRATE (PF) 100 MCG/2ML IJ SOLN
25.0000 ug | Freq: Once | INTRAMUSCULAR | Status: AC
  Administered 2019-05-20: 25 ug via INTRAVENOUS
  Filled 2019-05-20: qty 2

## 2019-05-20 MED ORDER — PROPRANOLOL HCL 40 MG PO TABS
40.0000 mg | ORAL_TABLET | ORAL | Status: DC | PRN
  Filled 2019-05-20: qty 1

## 2019-05-20 MED ORDER — ONDANSETRON HCL 4 MG/2ML IJ SOLN: 4.0000 mg | Freq: Four times a day (QID) | INTRAMUSCULAR | Status: DC | PRN

## 2019-05-20 MED ORDER — ATORVASTATIN CALCIUM 40 MG PO TABS
40.0000 mg | ORAL_TABLET | Freq: Every day | ORAL | Status: DC
  Administered 2019-05-20 – 2019-05-22 (×3): 40 mg via ORAL
  Filled 2019-05-20 (×3): qty 1

## 2019-05-20 MED ORDER — MORPHINE SULFATE (PF) 4 MG/ML IV SOLN
4.0000 mg | Freq: Once | INTRAVENOUS | Status: AC
  Administered 2019-05-20: 4 mg via INTRAVENOUS
  Filled 2019-05-20: qty 1

## 2019-05-20 MED ORDER — ASPIRIN 81 MG PO CHEW
324.0000 mg | CHEWABLE_TABLET | ORAL | Status: AC
  Filled 2019-05-20: qty 4

## 2019-05-20 MED ORDER — ASPIRIN 300 MG RE SUPP: 300.0000 mg | RECTAL | Status: AC

## 2019-05-20 MED ORDER — SODIUM CHLORIDE 0.9 % IV SOLN
INTRAVENOUS | Status: DC
  Administered 2019-05-20: 12:00:00 via INTRAVENOUS

## 2019-05-20 MED ORDER — HEPARIN (PORCINE) 25000 UT/250ML-% IV SOLN
850.0000 [IU]/h | INTRAVENOUS | Status: DC
  Administered 2019-05-20 – 2019-05-21 (×2): 850 [IU]/h via INTRAVENOUS
  Filled 2019-05-20 (×2): qty 250

## 2019-05-20 MED ORDER — VITAMIN B-1 100 MG PO TABS
50.0000 mg | ORAL_TABLET | Freq: Every day | ORAL | Status: DC
  Administered 2019-05-20 – 2019-05-22 (×3): 50 mg via ORAL
  Filled 2019-05-20 (×3): qty 1

## 2019-05-20 MED ORDER — FOLIC ACID 1 MG PO TABS
1.0000 mg | ORAL_TABLET | Freq: Every day | ORAL | Status: DC
  Administered 2019-05-20 – 2019-05-22 (×3): 1 mg via ORAL
  Filled 2019-05-20 (×3): qty 1

## 2019-05-20 MED ORDER — HEPARIN BOLUS VIA INFUSION
4000.0000 [IU] | Freq: Once | INTRAVENOUS | Status: AC
  Administered 2019-05-20: 4000 [IU] via INTRAVENOUS
  Filled 2019-05-20: qty 4000

## 2019-05-20 MED ORDER — CLOPIDOGREL BISULFATE 75 MG PO TABS
75.0000 mg | ORAL_TABLET | Freq: Every day | ORAL | Status: DC
  Administered 2019-05-21 – 2019-05-22 (×2): 75 mg via ORAL
  Filled 2019-05-20 (×2): qty 1

## 2019-05-20 MED ORDER — ONDANSETRON HCL 4 MG/2ML IJ SOLN
4.0000 mg | Freq: Once | INTRAMUSCULAR | Status: AC
  Administered 2019-05-20: 10:00:00 4 mg via INTRAVENOUS
  Filled 2019-05-20: qty 2

## 2019-05-20 MED ORDER — INSULIN ASPART 100 UNIT/ML ~~LOC~~ SOLN
0.0000 [IU] | Freq: Three times a day (TID) | SUBCUTANEOUS | Status: DC
  Administered 2019-05-21: 2 [IU] via SUBCUTANEOUS
  Administered 2019-05-21 – 2019-05-22 (×2): 1 [IU] via SUBCUTANEOUS

## 2019-05-20 MED ORDER — NITROGLYCERIN 2 % TD OINT
0.5000 [in_us] | TOPICAL_OINTMENT | Freq: Four times a day (QID) | TRANSDERMAL | Status: DC
  Administered 2019-05-20 – 2019-05-22 (×6): 0.5 [in_us] via TOPICAL
  Filled 2019-05-20: qty 1
  Filled 2019-05-20: qty 30

## 2019-05-20 MED ORDER — ACETAMINOPHEN 325 MG PO TABS
650.0000 mg | ORAL_TABLET | ORAL | Status: DC | PRN
  Administered 2019-05-21 – 2019-05-22 (×2): 650 mg via ORAL
  Filled 2019-05-20 (×2): qty 2

## 2019-05-20 MED ORDER — NITROGLYCERIN 0.4 MG SL SUBL: 0.4000 mg | SUBLINGUAL_TABLET | SUBLINGUAL | Status: DC | PRN

## 2019-05-20 MED ORDER — ASPIRIN EC 81 MG PO TBEC
81.0000 mg | DELAYED_RELEASE_TABLET | Freq: Every day | ORAL | Status: DC
  Administered 2019-05-21 – 2019-05-22 (×2): 81 mg via ORAL
  Filled 2019-05-20 (×3): qty 1

## 2019-05-20 NOTE — H&P (Signed)
Jeffery Carpenter is an 78 y.o. male.   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 States had similar chest pain but less severe on Saturday night, took 2 sublingual nitroglycerin with relief, went to her daughter's house yesterday walked approximately three fourth of a mile without any problem has been going to rehabilitation regularly without any problem .  Denies any shortness of breath, denies palpitations, lightheadedness or syncope. EKG in the ED showed no new acute ischemic changes.  2 sets of high sensitivity troponin I have been negative. Past Medical History:  Diagnosis Date  . 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 Hemochromatosis   . HTN (hypertension), benign 02/06/2014   pt takes inderal for pre competition calmness  . 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 Myocardial infarction ([place])   . Other and unspecified hyperlipidemia 02/06/2014    Past Surgical History:  Procedure Laterality Date  . APPENDECTOMY    . BILATERAL CARPAL TUNNEL RELEASE Bilateral   . CORONARY STENT INTERVENTION N/A 03/25/2019   Procedure: CORONARY STENT INTERVENTION;  Surgeon: Belva Crome, MD;  Location: [facility];  Service: Cardiovascular;  Laterality: N/A;  . EYE SURGERY     cataract  . HERNIA REPAIR     abdomen  . JOINT REPLACEMENT     lt TKA  . KNEE ARTHROSCOPY Right   . LEFT HEART CATH AND CORONARY ANGIOGRAPHY  N/A 03/25/2019   Procedure: LEFT HEART CATH AND CORONARY ANGIOGRAPHY;  Surgeon: [name], MD;  Location: [facility];  Service: Cardiovascular;  Laterality: N/A;  . MASS EXCISION Left 09/12/2014   Procedure: LEFT ELBOW AND FOREARM MASS REMOVAL;  Surgeon: Roseanne Kaufman, MD;  Location: [facility];  Service: Orthopedics;  Laterality: Left;  . TONSILLECTOMY    . WRIST SURGERY Left     Family History  Problem Relation Age of Onset  . Colon cancer Mother   . Heart attack Father   . Diabetes Father   . Colon cancer Father   . Hemochromatosis Brother   . Liver disease Brother   . Hypertension Brother   . Diabetes Brother   . Cancer - Lung Brother        smoker  . Healthy Daughter   . Sinusitis Son    Social History:  reports that he quit smoking about 59 years ago. He has quit using smokeless tobacco. He reports current alcohol use. He reports that he does not use drugs.  Allergies:  Allergies  Allergen Reactions  . Bee Venom Anaphylaxis  . Chocolate Other (See Comments)    Causes migraines.  . Cucumber Extract Other (See Comments)    Cucumbers cause migraine headaches  . Onion Other (See Comments)    Onions cause migraine headaches  . Orange Fruit [Citrus]   . Penicillins Other (See Comments)    "just not effective" Did it involve swelling of the face/tongue/throat, SOB, or low BP? No Did it involve sudden  or severe rash/hives, skin peeling, or any reaction on the inside of your mouth or nose? No Did you need to seek medical attention at a hospital or doctor's office? No When did it last happen? If all above answers are "NO", may proceed with cephalosporin use.     (Not in a hospital admission)   Results for orders placed or performed during the hospital encounter of 05/20/19 (from the past 48 hour(s))  CBC with Differential/Platelet     Status: Abnormal   Collection Time: 05/20/19  6:56 AM  Result Value Ref Range   WBC 7.3 4.0 - 10.5 K/uL    RBC 3.96 (L) 4.22 - 5.81 MIL/uL   Hemoglobin 13.1 13.0 - 17.0 g/dL   HCT 38.3 (L) 39.0 - 52.0 %   MCV 96.7 80.0 - 100.0 fL   MCH 33.1 26.0 - 34.0 pg   MCHC 34.2 30.0 - 36.0 g/dL   RDW 12.4 11.5 - 15.5 %   Platelets 251 150 - 400 K/uL   nRBC 0.0 0.0 - 0.2 %   Neutrophils Relative % 62 %   Neutro Abs 4.6 1.7 - 7.7 K/uL   Lymphocytes Relative 25 %   Lymphs Abs 1.8 0.7 - 4.0 K/uL   Monocytes Relative 9 %   Monocytes Absolute 0.7 0.1 - 1.0 K/uL   Eosinophils Relative 3 %   Eosinophils Absolute 0.2 0.0 - 0.5 K/uL   Basophils Relative 1 %   Basophils Absolute 0.1 0.0 - 0.1 K/uL   Immature Granulocytes 0 %   Abs Immature Granulocytes 0.02 0.00 - 0.07 K/uL    Comment: Performed at [facility], [address] [place], [place] [id number]  Basic metabolic panel     Status: Abnormal   Collection Time: 05/20/19  6:56 AM  Result Value Ref Range   Sodium 140 135 - 145 mmol/L   Potassium 4.6 3.5 - 5.1 mmol/L   Chloride 110 98 - 111 mmol/L   CO2 21 (L) 22 - 32 mmol/L   Glucose, Bld 156 (H) 70 - 99 mg/dL   BUN 30 (H) 8 - 23 mg/dL   Creatinine, Ser 1.43 (H) 0.61 - 1.24 mg/dL   Calcium 9.0 8.9 - 10.3 mg/dL   GFR calc non Af Amer 46 (L) >60 mL/min   GFR calc Af Amer 54 (L) >60 mL/min   Anion gap 9 5 - 15    Comment: Performed at [place] [address], [place], [place] [address]  Troponin I (High Sensitivity)     Status: None   Collection Time: 05/20/19  6:56 AM  Result Value Ref Range   Troponin I (High Sensitivity) 7 <18 ng/L    Comment: (NOTE) Elevated high sensitivity troponin I (hsTnI) values and significant  changes across serial measurements may suggest ACS but many other  chronic and acute conditions are known to elevate hsTnI results.  Refer to the "Links" section for chest pain algorithms and additional  guidance. Performed at [facility], [place] [address] [place], [place] [address]   SARS Coronavirus 2 [facility] order, Performed in [facility]  hospital lab) Nasopharyngeal Nasopharyngeal Swab     Status: None   Collection Time: 05/20/19  7:04 AM   Specimen: Nasopharyngeal Swab  Result Value Ref Range   SARS Coronavirus 2 NEGATIVE NEGATIVE    Comment: (NOTE) If result is NEGATIVE SARS-CoV-2 target nucleic acids are NOT DETECTED. The SARS-CoV-2 RNA is generally detectable in upper and lower  respiratory specimens during the acute phase of infection. The lowest  concentration of SARS-CoV-2 viral copies this assay can detect is 250  copies / mL. A negative result does not preclude SARS-CoV-2 infection  and should not be used as the sole basis for treatment or other  patient management decisions.  A negative result may occur with  improper specimen collection / handling, submission of specimen other  than nasopharyngeal swab, presence of viral mutation(s) within the  areas targeted by this assay, and inadequate number of viral copies  (<250 copies / mL). A negative result must be combined with clinical  observations, patient history, and epidemiological information. If result is POSITIVE SARS-CoV-2 target nucleic acids are DETECTED. The SARS-CoV-2 RNA is generally detectable in upper and lower  respiratory specimens dur ing the acute phase of infection.  Positive  results are indicative of active infection with SARS-CoV-2.  Clinical  correlation with patient history and other diagnostic information is  necessary to determine patient infection status.  Positive results do  not rule out bacterial infection or co-infection with other viruses. If result is PRESUMPTIVE POSTIVE SARS-CoV-2 nucleic acids MAY BE PRESENT.   A presumptive positive result was obtained on the submitted specimen  and confirmed on repeat testing.  While 2019 novel coronavirus  (SARS-CoV-2) nucleic acids may be present in the submitted sample  additional confirmatory testing may be necessary for epidemiological  and / or clinical management purposes  to  differentiate between  SARS-CoV-2 and other Sarbecovirus currently known to infect humans.  If clinically indicated additional testing with an alternate test  methodology [phone]) is advised. The SARS-CoV-2 RNA is generally  detectable in upper and lower respiratory sp ecimens during the acute  phase of infection. The expected result is Negative. Fact Sheet for Patients:  StrictlyIdeas.no Fact Sheet for Healthcare Providers: BankingDealers.co.za This test is not yet approved or cleared by the [place] FDA and has been authorized for detection and/or diagnosis of SARS-CoV-2 by FDA under an Emergency Use Authorization (EUA).  This EUA will remain in effect (meaning this test can be used) for the duration of the COVID-19 declaration under Section 564(b)(1) of the Act, 21 U.S.C. section 360bbb-3(b)(1), unless the authorization is terminated or revoked sooner. Performed at [facility], [place] [address] [place], [place] [address]   Troponin I (High Sensitivity)     Status: None   Collection Time: 05/20/19  9:16 AM  Result Value Ref Range   Troponin I (High Sensitivity) 9 <18 ng/L    Comment: (NOTE) Elevated high sensitivity troponin I (hsTnI) values and significant  changes across serial measurements may suggest ACS but many other  chronic and acute conditions are known to elevate hsTnI results.  Refer to the "Links" section for chest pain algorithms and additional  guidance. Performed at [facility], [place] [address] [place], [place] [address]    Dg Chest Port 1 View  Result Date: 05/20/2019 CLINICAL DATA:  Chest pain. EXAM: PORTABLE CHEST 1 VIEW COMPARISON:  Radiograph March 23, 2019. FINDINGS: Stable cardiomediastinal silhouette. Atherosclerosis of thoracic aorta is noted. No pneumothorax or pleural effusion is noted. Both lungs are clear. The visualized skeletal structures are unremarkable. IMPRESSION: No active  disease. Aortic Atherosclerosis (ICD10-I70.0). Electronically Signed   By: Marijo Conception M.D.   On: 05/20/2019 07:18    Review of Systems  Constitutional: Positive for chills and fever.  Eyes: Negative for blurred vision.  Respiratory: Negative for cough and shortness of breath.  Cardiovascular: Positive for chest pain. Negative for orthopnea, claudication and leg swelling.  Gastrointestinal: Positive for nausea. Negative for abdominal pain and vomiting.  Genitourinary: Negative for dysuria.  Skin: Negative for rash.  Neurological: Positive for dizziness.    Blood pressure (!) 116/104, pulse 72, temperature 97.9 F (36.6 C), resp. rate 16, height 5\' 7"  (1.702 m), weight 85 kg, SpO2 96 %. Physical Exam  HENT:  Head: Atraumatic.  Eyes: Conjunctivae are normal. Left eye exhibits no discharge. No scleral icterus.  Neck: Neck supple. No JVD present. No tracheal deviation present. No thyromegaly present.  Cardiovascular: Normal rate and regular rhythm.  Murmur (soft systolic murmur noted.  No S3 gallop) heard. Respiratory: Effort normal and breath sounds normal. No respiratory distress. He has no wheezes. He has no rales.  GI: Soft. Bowel sounds are normal. He exhibits no distension. There is no abdominal tenderness. There is no rebound.  Musculoskeletal:        General: No tenderness or edema.  Neurological: He is alert.     Assessment/Plan Unstable angina, rule out MI Coronary artery disease, history of non-Q-wave MI in July 2020.  Status post PCI to left circumflex. Hypertension. Diabetes mellitus. Hyperlipidemia. Chronic kidney disease stage II. Degenerative joint disease. History of hemachromatosis. History of remote tobacco abuse Plan As per orders.  [name], MD 05/20/2019, 10:53 AM

## 2019-05-20 NOTE — ED Notes (Signed)
Attempted to call report.  Nurse unavailable. 

## 2019-05-20 NOTE — Progress Notes (Signed)
ANTICOAGULATION CONSULT NOTE - Initial Consult  Pharmacy Consult for heparin Indication: chest pain/ACS  Allergies  Allergen Reactions  . Bee Venom Anaphylaxis  . Chocolate Other (See Comments)    Causes migraines.  . Cucumber Extract Other (See Comments)    Cucumbers cause migraine headaches  . Onion Other (See Comments)    Onions cause migraine headaches  . Orange Fruit [Citrus]   . Penicillins Other (See Comments)    "just not effective" Did it involve swelling of the face/tongue/throat, SOB, or low BP? No Did it involve sudden or severe rash/hives, skin peeling, or any reaction on the inside of your mouth or nose? No Did you need to seek medical attention at a hospital or doctor's office? No When did it last happen? If all above answers are "NO", may proceed with cephalosporin use.     Patient Measurements: Height: 5\' 7"  (170.2 cm) Weight: 187 lb 6.3 oz (85 kg) IBW/kg (Calculated) : 66.1 Heparin Dosing Weight: 83.3kg  Vital Signs: Temp: 97.9 F (36.6 C) (09/14 0629) BP: 116/104 (09/14 0930) Pulse Rate: 72 (09/14 0930)  Labs: Recent Labs    05/20/19 0656 05/20/19 0916  HGB 13.1  --   HCT 38.3*  --   PLT 251  --   CREATININE 1.43*  --   TROPONINIHS 7 9    Estimated Creatinine Clearance: 43.7 mL/min (A) (by C-G formula based on SCr of 1.43 mg/dL (H)).   Medical History: Past Medical History:  Diagnosis Date  . 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 Hemochromatosis   . HTN (hypertension), benign 02/06/2014   pt takes inderal for pre competition calmness  . 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 Myocardial infarction ([place])   . Other and unspecified hyperlipidemia 02/06/2014    Medications:  Infusions:  . heparin      Assessment: 71 yom presented to the ED with CP. To start IV heparin. Baseline CBC is WNL and he is not on anticoagulation PTA.   Goal of Therapy:  Heparin level 0.3-0.7 units/ml Monitor platelets by anticoagulation  protocol: Yes   Plan:  Heparin bolus 4000 units IV x 1 Heparin gtt 850 units/hr Check an 8 hr heparin level Daily heparin level and CBC  Haydee Jabbour, Rande Lawman 05/20/2019,11:22 AM

## 2019-05-20 NOTE — ED Notes (Signed)
CBG 108, prior to eating, machine is malfunctioning

## 2019-05-20 NOTE — Progress Notes (Deleted)
[place] for heparin Indication: chest pain/ACS  Allergies  Allergen Reactions  . Bee Venom Anaphylaxis  . Chocolate Other (See Comments)    Causes migraines.  . Cucumber Extract Other (See Comments)    Cucumbers cause migraine headaches  . Onion Other (See Comments)    Onions cause migraine headaches  . Orange Fruit [Citrus]   . Penicillins Other (See Comments)    "just not effective" Did it involve swelling of the face/tongue/throat, SOB, or low BP? No Did it involve sudden or severe rash/hives, skin peeling, or any reaction on the inside of your mouth or nose? No Did you need to seek medical attention at a hospital or doctor's office? No When did it last happen? If all above answers are "NO", may proceed with cephalosporin use.     Patient Measurements: Height: 5\' 7"  (170.2 cm) Weight: 187 lb 6.3 oz (85 kg) IBW/kg (Calculated) : 66.1 Heparin Dosing Weight: 83.3kg  Vital Signs: Temp: 98.1 F (36.7 C) (09/14 1641) Temp Source: Oral (09/14 1641) BP: 104/75 (09/14 1641) Pulse Rate: 61 (09/14 1641)  Labs: Recent Labs    05/20/19 0656 05/20/19 0916 05/20/19 1214 05/20/19 1450 05/20/19 1950  HGB 13.1  --   --   --   --   HCT 38.3*  --   --   --   --   PLT 251  --   --   --   --   HEPARINUNFRC  --   --   --   --  0.60  CREATININE 1.43*  --   --   --   --   TROPONINIHS 7 9 9 8   --     Estimated Creatinine Clearance: 43.7 mL/min (A) (by C-G formula based on SCr of 1.43 mg/dL (H)).   Assessment: 57 yom presented to the ED with CP.  Heparin level therapeutic  Goal of Therapy:  Heparin level 0.3-0.7 units/ml Monitor platelets by anticoagulation protocol: Yes   Plan:  Continue heparin at 850 units / hr Daily heparin level and CBC  Tad Moore 05/20/2019,8:46 PM

## 2019-05-20 NOTE — Plan of Care (Signed)
  Problem: Pain Managment: Goal: General experience of comfort will improve Outcome: Progressing   Problem: Safety: Goal: Ability to remain free from injury will improve Outcome: Progressing   

## 2019-05-20 NOTE — ED Notes (Signed)
Lunch tray ordered 

## 2019-05-20 NOTE — ED Provider Notes (Signed)
Signed out by Dr Betsey Holiday to admit to Dr Terrence Dupont once trop back.  Initial hs trop is normal.  Recheck pt, no current chest pain.  Dr Terrence Dupont consulted - he will see in ED.  Delta trop remains pending.      Lajean Saver, MD 05/20/19 [id number]

## 2019-05-20 NOTE — Progress Notes (Signed)
[place] for heparin Indication: chest pain/ACS  Allergies  Allergen Reactions  . Bee Venom Anaphylaxis  . Chocolate Other (See Comments)    Causes migraines.  . Cucumber Extract Other (See Comments)    Cucumbers cause migraine headaches  . Onion Other (See Comments)    Onions cause migraine headaches  . Orange Fruit [Citrus]   . Penicillins Other (See Comments)    "just not effective" Did it involve swelling of the face/tongue/throat, SOB, or low BP? No Did it involve sudden or severe rash/hives, skin peeling, or any reaction on the inside of your mouth or nose? No Did you need to seek medical attention at a hospital or doctor's office? No When did it last happen? If all above answers are "NO", may proceed with cephalosporin use.     Patient Measurements: Height: 5\' 7"  (170.2 cm) Weight: 187 lb 6.3 oz (85 kg) IBW/kg (Calculated) : 66.1 Heparin Dosing Weight: 83.3kg  Vital Signs: Temp: 98.1 F (36.7 C) (09/14 1641) Temp Source: Oral (09/14 1641) BP: 104/75 (09/14 1641) Pulse Rate: 61 (09/14 1641)  Labs: Recent Labs    05/20/19 0656 05/20/19 0916 05/20/19 1214 05/20/19 1450 05/20/19 1950  HGB 13.1  --   --   --   --   HCT 38.3*  --   --   --   --   PLT 251  --   --   --   --   HEPARINUNFRC  --   --   --   --  0.60  CREATININE 1.43*  --   --   --   --   TROPONINIHS 7 9 9 8   --     Estimated Creatinine Clearance: 43.7 mL/min (A) (by C-G formula based on SCr of 1.43 mg/dL (H)).   Medical History: Past Medical History:  Diagnosis Date  . 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 Hemochromatosis   . HTN (hypertension), benign 02/06/2014   pt takes inderal for pre competition calmness  . 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 Myocardial infarction ([place])   . Other and unspecified hyperlipidemia 02/06/2014    Medications:  Infusions:  . sodium chloride 50 mL/hr at 05/20/19 1222  . heparin 850 Units/hr (05/20/19  1348)    Assessment: 49 yom presented to the ED with CP (not on anticoagulation PTA). Pharmacy dosing heparin -initial heparin level is at goal   Goal of Therapy:  Heparin level 0.3-0.7 units/ml Monitor platelets by anticoagulation protocol: Yes   Plan:  No heparin changes needed Daily heparin level and CBC  Hildred Laser, PharmD Clinical Pharmacist **Pharmacist phone directory can now be found on [email or web address] ([email or web address]).  Listed under [place].

## 2019-05-20 NOTE — ED Provider Notes (Signed)
[facility] EMERGENCY DEPARTMENT Provider Note   CSN: [id number] Arrival date & time: 05/20/19  [id number]     History   Chief Complaint Chief Complaint  Patient presents with  . Chest Pain    HPI Jeffery Carpenter is a 78 y.o. male.     Patient presents to the emergency department for evaluation of chest pain.  Patient reports that he was awakened from sleep around 3:30 AM with pain under the left arm, radiating down the left arm.  He took a sublingual nitroglycerin without relief.  Patient reports that he took 2 more nitroglycerin, felt dizzy after this but the pain was still there.  He called 911.  911 operator had him take 324 mg of aspirin.  Patient arrives in the ER with EMS.  He reports that the pain is still present.  This is similar to the pain that he had 2 months ago when he had a heart attack.  He had stents placed at that time.  He has been taking all of his medications as prescribed, including his Plavix.  Patient reports mild nausea with onset of the pain but this has resolved.  No diaphoresis or shortness of breath.  He has started to have pain on the right side of his neck now.     Past Medical History:  Diagnosis Date  . 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 Hemochromatosis   . HTN (hypertension), benign 02/06/2014   pt takes inderal for pre competition calmness  . 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 Myocardial infarction ([place])   . Other and unspecified hyperlipidemia 02/06/2014    Patient Active Problem List   Diagnosis Date Noted  . Unstable angina ([place])   . Chest pain 03/23/2019  . Dyslipidemia 03/23/2019  . 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 Other and unspecified hyperlipidemia 02/06/2014  . HTN (hypertension), benign 02/06/2014  . Hemochromatosis 05/08/2012    Past Surgical History:  Procedure Laterality Date  . APPENDECTOMY    . BILATERAL CARPAL TUNNEL RELEASE Bilateral   . CORONARY STENT INTERVENTION N/A 03/25/2019   Procedure:  CORONARY STENT INTERVENTION;  Surgeon: Belva Crome, MD;  Location: [facility];  Service: Cardiovascular;  Laterality: N/A;  . EYE SURGERY     cataract  . HERNIA REPAIR     abdomen  . JOINT REPLACEMENT     lt TKA  . KNEE ARTHROSCOPY Right   . LEFT HEART CATH AND CORONARY ANGIOGRAPHY N/A 03/25/2019   Procedure: LEFT HEART CATH AND CORONARY ANGIOGRAPHY;  Surgeon: [name], MD;  Location: [facility];  Service: Cardiovascular;  Laterality: N/A;  . MASS EXCISION Left 09/12/2014   Procedure: LEFT ELBOW AND FOREARM MASS REMOVAL;  Surgeon: Roseanne Kaufman, MD;  Location: [facility];  Service: Orthopedics;  Laterality: Left;  . TONSILLECTOMY    . WRIST SURGERY Left         Home Medications    Prior to Admission medications   Medication Sig Start Date End Date Taking? Authorizing Provider  acetaminophen (TYLENOL) 500 MG tablet Take 500 mg by mouth every 6 (six) hours as needed for mild pain, moderate pain or headache.    [provider]  amLODipine (NORVASC) 5 MG tablet Take 1 tablet (5 mg total) by mouth daily. 03/26/19   Mercy Riding, MD  aspirin 81 MG chewable tablet Chew 1 tablet (81 mg total) by mouth daily. 03/26/19   Cyndia Skeeters,  Charlesetta Ivory, MD  atorvastatin (LIPITOR) 80 MG tablet Take 1 tablet (80 mg total) by mouth daily at 6 PM. 03/26/19   Gonfa, Charlesetta Ivory, MD  clonazePAM (KLONOPIN) 0.5 MG tablet Take 0.5 mg by mouth 2 (two) times daily as needed for anxiety.  01/02/19   [provider]  clopidogrel (PLAVIX) 75 MG tablet Take 1 tablet (75 mg total) by mouth daily with breakfast. 03/26/19   Mercy Riding, MD  diphenhydrAMINE (BENADRYL) 25 MG tablet Take 25 mg by mouth daily as needed for itching (bug bites).    [provider]  EPINEPHrine 0.3 mg/0.3 mL IJ SOAJ injection Inject 0.3 mLs (0.3 mg total) into the muscle as needed for anaphylaxis. 02/02/19   Recardo Evangelist, PA-C  folic acid (FOLVITE) 1 MG tablet Take 1 tablet (1 mg total) by  mouth daily. 02/07/19   Brunetta Genera, MD  metFORMIN (GLUCOPHAGE) 500 MG tablet Take 1 tablet (500 mg total) by mouth 2 (two) times daily with a meal. 03/26/19 06/24/19  Mercy Riding, MD  metoprolol tartrate (LOPRESSOR) 25 MG tablet Take 1 tablet (25 mg total) by mouth 2 (two) times daily. 03/26/19   Mercy Riding, MD  nitroGLYCERIN (NITROSTAT) 0.4 MG SL tablet Place 1 tablet (0.4 mg total) under the tongue every 5 (five) minutes as needed for chest pain (CP or SOB). 03/26/19   Mercy Riding, MD  thiamine (VITAMIN B-1) 50 MG tablet Take 50 mg by mouth daily.      [provider]  triamcinolone cream (KENALOG) 0.5 % Apply 1 application topically 3 (three) times daily as needed (itching/ insect bites).    [provider]    Family History Family History  Problem Relation Age of Onset  . Colon cancer Mother   . Heart attack Father   . Diabetes Father   . Colon cancer Father   . Hemochromatosis Brother   . Liver disease Brother   . Hypertension Brother   . Diabetes Brother   . Cancer - Lung Brother        smoker  . Healthy Daughter   . Sinusitis Son     Social History Social History   Tobacco Use  . Smoking status: Former Smoker    Quit date: 02/07/1960    Years since quitting: 59.3  . Smokeless tobacco: Former Network engineer Use Topics  . Alcohol use: Yes    Alcohol/week: 0.0 standard drinks    Comment: 1 time a month  . Drug use: No     Allergies   Bee venom, Chocolate, Cucumber extract, Onion, Orange fruit [citrus], and Penicillins   Review of Systems Review of Systems  Cardiovascular: Positive for chest pain.  All other systems reviewed and are negative.    Physical Exam Updated Vital Signs BP 112/76 (BP Location: Left Arm)   Pulse 73   Temp 97.9 F (36.6 C)   Resp 20   Ht 5\' 7"  (1.702 m)   Wt 85 kg   BMI 29.35 kg/m   Physical Exam Vitals signs and nursing note reviewed.  Constitutional:      General: He is not in acute distress.     Appearance: Normal appearance. He is well-developed.  HENT:     Head: Normocephalic and atraumatic.     Right Ear: Hearing normal.     Left Ear: Hearing normal.     Nose: Nose normal.  Eyes:     Conjunctiva/sclera: Conjunctivae normal.  Pupils: Pupils are equal, round, and reactive to light.  Neck:     Musculoskeletal: Normal range of motion and neck supple.  Cardiovascular:     Rate and Rhythm: Regular rhythm.     Heart sounds: S1 normal and S2 normal. No murmur. No friction rub. No gallop.   Pulmonary:     Effort: Pulmonary effort is normal. No respiratory distress.     Breath sounds: Normal breath sounds.  Chest:     Chest wall: No tenderness.  Abdominal:     General: Bowel sounds are normal.     Palpations: Abdomen is soft.     Tenderness: There is no abdominal tenderness. There is no guarding or rebound. Negative signs include Murphy's sign and McBurney's sign.     Hernia: No hernia is present.  Musculoskeletal: Normal range of motion.  Skin:    General: Skin is warm and dry.     Findings: No rash.  Neurological:     Mental Status: He is alert and oriented to person, place, and time.     GCS: GCS eye subscore is 4. GCS verbal subscore is 5. GCS motor subscore is 6.     Cranial Nerves: No cranial nerve deficit.     Sensory: No sensory deficit.     Coordination: Coordination normal.  Psychiatric:        Speech: Speech normal.        Behavior: Behavior normal.        Thought Content: Thought content normal.      ED Treatments / Results  Labs (all labs ordered are listed, but only abnormal results are displayed) Labs Reviewed  SARS CORONAVIRUS 2 ([facility])  CBC WITH DIFFERENTIAL/PLATELET  BASIC METABOLIC PANEL  TROPONIN I (HIGH SENSITIVITY)    EKG None  Radiology No results found.  Procedures Procedures (including critical care time)  Medications Ordered in ED Medications - No data to display    Initial Impression / Assessment and Plan / ED Course  I have reviewed the triage vital signs and the nursing notes.  Pertinent labs & imaging results that were available during my care of the patient were reviewed by me and considered in my medical decision making (see chart for details).       Patient presents to the emergency department for evaluation of chest pain.  He is experiencing pain in the left lateral chest wall and axilla with radiation to the left arm.  This is similar to pain he had with an NSTEMI in July.  Presentation EKG does not show any obvious ischemia or infarct.  Will proceed with cardiac enzymes and consult Dr. Terrence Dupont, his cardiologist.   Final Clinical Impressions(s) / ED Diagnoses   Final diagnoses:  Chest pain, unspecified type    ED Discharge Orders    None       Orpah Greek, MD 05/20/19 [phone]

## 2019-05-20 NOTE — ED Notes (Signed)
ED TO INPATIENT HANDOFF REPORT  ED Nurse Name and Phone #: Lattie Haw, RN   S Name/Age/Gender Jeffery Carpenter 78 y.o. male Room/Bed: 013C/013C  Code Status   Code Status: Full Code  Home/SNF/Other Home Patient oriented to: self, place, time and situation Is this baseline? Yes   Triage Complete: Triage complete  Chief Complaint cp  Triage Note No notes on file   Allergies Allergies  Allergen Reactions  . Bee Venom Anaphylaxis  . Chocolate Other (See Comments)    Causes migraines.  . Cucumber Extract Other (See Comments)    Cucumbers cause migraine headaches  . Onion Other (See Comments)    Onions cause migraine headaches  . Orange Fruit [Citrus]   . Penicillins Other (See Comments)    "just not effective" Did it involve swelling of the face/tongue/throat, SOB, or low BP? No Did it involve sudden or severe rash/hives, skin peeling, or any reaction on the inside of your mouth or nose? No Did you need to seek medical attention at a hospital or doctor's office? No When did it last happen? If all above answers are "NO", may proceed with cephalosporin use.     Level of Care/Admitting Diagnosis ED Disposition    ED Disposition Condition Comment   Admit  Hospital Area: [facility] [[id number]]  Level of Care: Telemetry Cardiac [103]  Covid Evaluation: Asymptomatic Screening Protocol (No Symptoms)  [facility]: [id number]  Admitting Physician: [name] [[id number]]  Attending Physician: [name] [[id number]]  PT Class (Do Not Modify): Observation [104]  PT Acc Code (Do Not Modify): Observation [10022]       B Medical/Surgery History Past Medical History:  Diagnosis Date  . 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 Hemochromatosis   . HTN (hypertension), benign 02/06/2014   pt takes inderal for pre competition calmness  . 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 Myocardial infarction ([place])   . Other and unspecified  hyperlipidemia 02/06/2014   Past Surgical History:  Procedure Laterality Date  . APPENDECTOMY    . BILATERAL CARPAL TUNNEL RELEASE Bilateral   . CORONARY STENT INTERVENTION N/A 03/25/2019   Procedure: CORONARY STENT INTERVENTION;  Surgeon: Belva Crome, MD;  Location: [facility];  Service: Cardiovascular;  Laterality: N/A;  . EYE SURGERY     cataract  . HERNIA REPAIR     abdomen  . JOINT REPLACEMENT     lt TKA  . KNEE ARTHROSCOPY Right   . LEFT HEART CATH AND CORONARY ANGIOGRAPHY N/A 03/25/2019   Procedure: LEFT HEART CATH AND CORONARY ANGIOGRAPHY;  Surgeon: [name], MD;  Location: [facility];  Service: Cardiovascular;  Laterality: N/A;  . MASS EXCISION Left 09/12/2014   Procedure: LEFT ELBOW AND FOREARM MASS REMOVAL;  Surgeon: Roseanne Kaufman, MD;  Location: [facility];  Service: Orthopedics;  Laterality: Left;  . TONSILLECTOMY    . WRIST SURGERY Left      A IV Location/Drains/Wounds Patient Lines/Drains/Airways Status   Active Line/Drains/Airways    Name:   Placement date:   Placement time:   Site:   Days:   Peripheral IV 05/20/19 Right Antecubital   05/20/19    1000    Antecubital   less than 1          Intake/Output Last 24 hours No intake or output data in the 24 hours ending 05/20/19 1528  Labs/Imaging Results for orders placed or performed during the hospital  encounter of 05/20/19 (from the past 48 hour(s))  CBC with Differential/Platelet     Status: Abnormal   Collection Time: 05/20/19  6:56 AM  Result Value Ref Range   WBC 7.3 4.0 - 10.5 K/uL   RBC 3.96 (L) 4.22 - 5.81 MIL/uL   Hemoglobin 13.1 13.0 - 17.0 g/dL   HCT 38.3 (L) 39.0 - 52.0 %   MCV 96.7 80.0 - 100.0 fL   MCH 33.1 26.0 - 34.0 pg   MCHC 34.2 30.0 - 36.0 g/dL   RDW 12.4 11.5 - 15.5 %   Platelets 251 150 - 400 K/uL   nRBC 0.0 0.0 - 0.2 %   Neutrophils Relative % 62 %   Neutro Abs 4.6 1.7 - 7.7 K/uL   Lymphocytes Relative 25 %   Lymphs Abs 1.8 0.7 - 4.0 K/uL    Monocytes Relative 9 %   Monocytes Absolute 0.7 0.1 - 1.0 K/uL   Eosinophils Relative 3 %   Eosinophils Absolute 0.2 0.0 - 0.5 K/uL   Basophils Relative 1 %   Basophils Absolute 0.1 0.0 - 0.1 K/uL   Immature Granulocytes 0 %   Abs Immature Granulocytes 0.02 0.00 - 0.07 K/uL    Comment: Performed at [facility], [address], [place], [place] [id number]  Basic metabolic panel     Status: Abnormal   Collection Time: 05/20/19  6:56 AM  Result Value Ref Range   Sodium 140 135 - 145 mmol/L   Potassium 4.6 3.5 - 5.1 mmol/L   Chloride 110 98 - 111 mmol/L   CO2 21 (L) 22 - 32 mmol/L   Glucose, Bld 156 (H) 70 - 99 mg/dL   BUN 30 (H) 8 - 23 mg/dL   Creatinine, Ser 1.43 (H) 0.61 - 1.24 mg/dL   Calcium 9.0 8.9 - 10.3 mg/dL   GFR calc non Af Amer 46 (L) >60 mL/min   GFR calc Af Amer 54 (L) >60 mL/min   Anion gap 9 5 - 15    Comment: Performed at [facility] [address] [place], [place] [address]  Troponin I (High Sensitivity)     Status: None   Collection Time: 05/20/19  6:56 AM  Result Value Ref Range   Troponin I (High Sensitivity) 7 <18 ng/L    Comment: (NOTE) Elevated high sensitivity troponin I (hsTnI) values and significant  changes across serial measurements may suggest ACS but many other  chronic and acute conditions are known to elevate hsTnI results.  Refer to the "Links" section for chest pain algorithms and additional  guidance. Performed at [facility], [place] [address] [place], [place] [address]   SARS Coronavirus 2 [facility] order, Performed in [facility] hospital lab) Nasopharyngeal Nasopharyngeal Swab     Status: None   Collection Time: 05/20/19  7:04 AM   Specimen: Nasopharyngeal Swab  Result Value Ref Range   SARS Coronavirus 2 NEGATIVE NEGATIVE    Comment: (NOTE) If result is NEGATIVE SARS-CoV-2 target nucleic acids are NOT DETECTED. The SARS-CoV-2 RNA is generally detectable in upper and lower  respiratory specimens during the acute  phase of infection. The lowest  concentration of SARS-CoV-2 viral copies this assay can detect is 250  copies / mL. A negative result does not preclude SARS-CoV-2 infection  and should not be used as the sole basis for treatment or other  patient management decisions.  A negative result may occur with  improper specimen collection / handling, submission of  specimen other  than nasopharyngeal swab, presence of viral mutation(s) within the  areas targeted by this assay, and inadequate number of viral copies  (<250 copies / mL). A negative result must be combined with clinical  observations, patient history, and epidemiological information. If result is POSITIVE SARS-CoV-2 target nucleic acids are DETECTED. The SARS-CoV-2 RNA is generally detectable in upper and lower  respiratory specimens dur ing the acute phase of infection.  Positive  results are indicative of active infection with SARS-CoV-2.  Clinical  correlation with patient history and other diagnostic information is  necessary to determine patient infection status.  Positive results do  not rule out bacterial infection or co-infection with other viruses. If result is PRESUMPTIVE POSTIVE SARS-CoV-2 nucleic acids MAY BE PRESENT.   A presumptive positive result was obtained on the submitted specimen  and confirmed on repeat testing.  While 2019 novel coronavirus  (SARS-CoV-2) nucleic acids may be present in the submitted sample  additional confirmatory testing may be necessary for epidemiological  and / or clinical management purposes  to differentiate between  SARS-CoV-2 and other Sarbecovirus currently known to infect humans.  If clinically indicated additional testing with an alternate test  methodology [phone]) is advised. The SARS-CoV-2 RNA is generally  detectable in upper and lower respiratory sp ecimens during the acute  phase of infection. The expected result is Negative. Fact Sheet for Patients:   StrictlyIdeas.no Fact Sheet for Healthcare Providers: BankingDealers.co.za This test is not yet approved or cleared by the [place] FDA and has been authorized for detection and/or diagnosis of SARS-CoV-2 by FDA under an Emergency Use Authorization (EUA).  This EUA will remain in effect (meaning this test can be used) for the duration of the COVID-19 declaration under Section 564(b)(1) of the Act, 21 U.S.C. section 360bbb-3(b)(1), unless the authorization is terminated or revoked sooner. Performed at [facility], [place] [address] [place], [place] [address]   Troponin I (High Sensitivity)     Status: None   Collection Time: 05/20/19  9:16 AM  Result Value Ref Range   Troponin I (High Sensitivity) 9 <18 ng/L    Comment: (NOTE) Elevated high sensitivity troponin I (hsTnI) values and significant  changes across serial measurements may suggest ACS but many other  chronic and acute conditions are known to elevate hsTnI results.  Refer to the "Links" section for chest pain algorithms and additional  guidance. Performed at [facility], [place] [address], [place], [place] [address]   Troponin I (High Sensitivity)     Status: None   Collection Time: 05/20/19 12:14 PM  Result Value Ref Range   Troponin I (High Sensitivity) 9 <18 ng/L    Comment: (NOTE) Elevated high sensitivity troponin I (hsTnI) values and significant  changes across serial measurements may suggest ACS but many other  chronic and acute conditions are known to elevate hsTnI results.  Refer to the "Links" section for chest pain algorithms and additional  guidance. Performed at [facility], [place] [address], [place], [place] [address]    Dg Chest Port 1 View  Result Date: 05/20/2019 CLINICAL DATA:  Chest pain. EXAM: PORTABLE CHEST 1 VIEW COMPARISON:  Radiograph March 23, 2019. FINDINGS: Stable cardiomediastinal silhouette. Atherosclerosis of thoracic aorta  is noted. No pneumothorax or pleural effusion is noted. Both lungs are clear. The visualized skeletal structures are unremarkable. IMPRESSION: No active disease. Aortic Atherosclerosis (ICD10-I70.0). Electronically Signed   By: Marijo Conception M.D.   On: 05/20/2019  07:18    Pending Labs Unresulted Labs (From admission, onward)    Start     Ordered   05/21/19 [id number]  Basic metabolic panel  Tomorrow morning,   R     05/20/19 1151   05/21/19 0500  Lipid panel  Tomorrow morning,   R     05/20/19 1151   05/21/19 0500  CBC  Tomorrow morning,   R     05/20/19 1151   05/21/19 0500  Heparin level (unfractionated)  Daily,   R     05/20/19 1122   05/21/19 0500  CBC  Daily,   R     05/20/19 1122   05/20/19 2000  Heparin level (unfractionated)  Once-Timed,   STAT     05/20/19 1122          Vitals/Pain Today's Vitals   05/20/19 1209 05/20/19 1215 05/20/19 1230 05/20/19 1351  BP:  129/87    Pulse:   60   Resp:  13 18   Temp:      SpO2:   97%   Weight:      Height:      PainSc: 3    1     Isolation Precautions No active isolations  Medications Medications  propranolol (INDERAL) tablet 40 mg (has no administration in time range)  clopidogrel (PLAVIX) tablet 75 mg (has no administration in time range)  folic acid (FOLVITE) tablet 1 mg (1 mg Oral Given 05/20/19 1217)  thiamine (VITAMIN B-1) tablet 50 mg (50 mg Oral Given 05/20/19 1217)  nitroGLYCERIN (NITROSTAT) SL tablet 0.4 mg (has no administration in time range)  aspirin chewable tablet 324 mg (0 mg Oral Hold 05/20/19 1216)    Or  aspirin suppository 300 mg ( Rectal See Alternative 05/20/19 1216)  aspirin EC tablet 81 mg (has no administration in time range)  acetaminophen (TYLENOL) tablet 650 mg (has no administration in time range)  ondansetron (ZOFRAN) injection 4 mg (has no administration in time range)  0.9 %  sodium chloride infusion ( Intravenous New Bag/Given 05/20/19 1222)  nitroGLYCERIN (NITROGLYN) 2 % ointment 0.5 inch (0.5  inches Topical Given 05/20/19 1217)  metoprolol tartrate (LOPRESSOR) tablet 12.5 mg (12.5 mg Oral Given 05/20/19 1217)  atorvastatin (LIPITOR) tablet 40 mg (has no administration in time range)  insulin aspart (novoLOG) injection 0-9 Units (0 Units Subcutaneous Not Given 05/20/19 1255)  heparin ADULT infusion 100 units/mL (25000 units/270mL sodium chloride 0.45%) (850 Units/hr Intravenous New Bag/Given 05/20/19 1348)  fentaNYL (SUBLIMAZE) injection 25 mcg (25 mcg Intravenous Given 05/20/19 0742)  morphine 4 MG/ML injection 4 mg (4 mg Intravenous Given 05/20/19 0936)  ondansetron (ZOFRAN) injection 4 mg (4 mg Intravenous Given 05/20/19 0936)  heparin bolus via infusion 4,000 Units (4,000 Units Intravenous Bolus from Bag 05/20/19 1349)    Mobility walks Low fall risk   Focused Assessments Cardiac Assessment Handoff:  Cardiac Rhythm: Normal sinus rhythm No results found for: CKTOTAL, CKMB, CKMBINDEX, TROPONINI No results found for: DDIMER Does the Patient currently have chest pain? Yes      R Recommendations: See Admitting Provider Note  Report given to:   Additional Notes:

## 2019-05-21 ENCOUNTER — Observation Stay (HOSPITAL_COMMUNITY): Payer: Medicare Other

## 2019-05-21 ENCOUNTER — Telehealth (HOSPITAL_COMMUNITY): Payer: Self-pay | Admitting: Family Medicine

## 2019-05-21 LAB — CBC
HCT: 39.9 % (ref 39.0–52.0)
Hemoglobin: 13.6 g/dL (ref 13.0–17.0)
MCH: 32.8 pg (ref 26.0–34.0)
MCHC: 34.1 g/dL (ref 30.0–36.0)
MCV: 96.1 fL (ref 80.0–100.0)
Platelets: 245 10*3/uL (ref 150–400)
RBC: 4.15 MIL/uL — ABNORMAL LOW (ref 4.22–5.81)
RDW: 12.7 % (ref 11.5–15.5)
WBC: 6.8 10*3/uL (ref 4.0–10.5)
nRBC: 0 % (ref 0.0–0.2)

## 2019-05-21 LAB — BASIC METABOLIC PANEL
Anion gap: 8 (ref 5–15)
BUN: 25 mg/dL — ABNORMAL HIGH (ref 8–23)
CO2: 25 mmol/L (ref 22–32)
Calcium: 8.9 mg/dL (ref 8.9–10.3)
Chloride: 109 mmol/L (ref 98–111)
Creatinine, Ser: 1.42 mg/dL — ABNORMAL HIGH (ref 0.61–1.24)
GFR calc Af Amer: 54 mL/min — ABNORMAL LOW (ref >60)
GFR calc non Af Amer: 47 mL/min — ABNORMAL LOW (ref >60)
Glucose, Bld: 123 mg/dL — ABNORMAL HIGH (ref 70–99)
Potassium: 4.3 mmol/L (ref 3.5–5.1)
Sodium: 142 mmol/L (ref 135–145)

## 2019-05-21 LAB — GLUCOSE, CAPILLARY
Glucose-Capillary: 130 mg/dL — ABNORMAL HIGH (ref 70–99)
Glucose-Capillary: 151 mg/dL — ABNORMAL HIGH (ref 70–99)
Glucose-Capillary: 99 mg/dL (ref 70–99)
Glucose-Capillary: 131 mg/dL — ABNORMAL HIGH (ref 70–99)

## 2019-05-21 LAB — LIPID PANEL
Cholesterol: 124 mg/dL (ref 0–200)
HDL: 41 mg/dL (ref >40)
LDL Cholesterol: 69 mg/dL (ref 0–99)
Total CHOL/HDL Ratio: 3 RATIO
Triglycerides: 69 mg/dL (ref <150)
VLDL: 14 mg/dL (ref 0–40)

## 2019-05-21 LAB — HEPARIN LEVEL (UNFRACTIONATED): Heparin Unfractionated: 0.53 IU/mL (ref 0.30–0.70)

## 2019-05-21 MED ORDER — SODIUM CHLORIDE 0.9 % IV SOLN: 250.0000 mL | INTRAVENOUS | Status: DC | PRN

## 2019-05-21 MED ORDER — REGADENOSON 0.4 MG/5ML IV SOLN
INTRAVENOUS | Status: AC
  Filled 2019-05-21: qty 5

## 2019-05-21 MED ORDER — SODIUM CHLORIDE 0.9 % WEIGHT BASED INFUSION
1.0000 mL/kg/h | INTRAVENOUS | Status: DC
  Administered 2019-05-22 (×2): 1 mL/kg/h via INTRAVENOUS

## 2019-05-21 MED ORDER — TECHNETIUM TC 99M TETROFOSMIN IV KIT
30.0000 | PACK | Freq: Once | INTRAVENOUS | Status: AC | PRN
  Administered 2019-05-21: 30 via INTRAVENOUS

## 2019-05-21 MED ORDER — SODIUM CHLORIDE 0.9% FLUSH
3.0000 mL | Freq: Two times a day (BID) | INTRAVENOUS | Status: DC
  Administered 2019-05-21: 3 mL via INTRAVENOUS

## 2019-05-21 MED ORDER — ASPIRIN 81 MG PO CHEW
81.0000 mg | CHEWABLE_TABLET | ORAL | Status: AC
  Administered 2019-05-22: 06:00:00 81 mg via ORAL
  Filled 2019-05-21: qty 1

## 2019-05-21 MED ORDER — REGADENOSON 0.4 MG/5ML IV SOLN
0.4000 mg | Freq: Once | INTRAVENOUS | Status: AC
  Administered 2019-05-21: 10:00:00 0.4 mg via INTRAVENOUS
  Filled 2019-05-21: qty 5

## 2019-05-21 MED ORDER — TECHNETIUM TC 99M TETROFOSMIN IV KIT
10.0000 | PACK | Freq: Once | INTRAVENOUS | Status: AC | PRN
  Administered 2019-05-21: 09:00:00 10 via INTRAVENOUS

## 2019-05-21 MED ORDER — SODIUM CHLORIDE 0.9% FLUSH: 3.0000 mL | INTRAVENOUS | Status: DC | PRN

## 2019-05-21 NOTE — Progress Notes (Signed)
Subjective:  Complains of vague precordial chest pain radiating to inner side of the arm earlier this morning underwent Lexiscan Myoview which showed moderate size mild inferior wall ischemia with EF of 54% noninvasive risk stratification intermediate.  Objective:  Vital Signs in the last 24 hours: Temp:  [97.3 F (36.3 C)-98.3 F (36.8 C)] 98.3 F (36.8 C) (09/15 1602) Pulse Rate:  [59-75] 65 (09/15 1602) Resp:  [18-20] 18 (09/15 1602) BP: (96-164)/(68-101) 96/68 (09/15 1602) SpO2:  [94 %-100 %] 98 % (09/15 1602) Weight:  [80.2 kg] 80.2 kg (09/15 0707)  Intake/Output from previous day: 09/14 0701 - 09/15 0700 In: 1535.3 [P.O.:480; I.V.:1055.3] Out: 1750 [Urine:1750] Intake/Output from this shift: Total I/O In: 240 [P.O.:240] Out: 1100 [Urine:1100]  Physical Exam: Neck: no adenopathy, no carotid bruit, no JVD and supple, symmetrical, trachea midline Lungs: clear to auscultation bilaterally Heart: regular rate and rhythm, S1, S2 normal and Soft systolic murmur noted no S3 gallop Abdomen: soft, non-tender; bowel sounds normal; no masses,  no organomegaly Extremities: extremities normal, atraumatic, no cyanosis or edema  Lab Results: Recent Labs    05/20/19 0656 05/21/19 0648  WBC 7.3 6.8  HGB 13.1 13.6  PLT 251 245   Recent Labs    05/20/19 0656 05/21/19 0648  NA 140 142  K 4.6 4.3  CL 110 109  CO2 21* 25  GLUCOSE 156* 123*  BUN 30* 25*  CREATININE 1.43* 1.42*   No results for input(s): TROPONINI in the last 72 hours.  Invalid input(s): CK, MB Hepatic Function Panel No results for input(s): PROT, ALBUMIN, AST, ALT, ALKPHOS, BILITOT, BILIDIR, IBILI in the last 72 hours. Recent Labs    05/21/19 0648  CHOL 124   No results for input(s): PROTIME in the last 72 hours.  Imaging: Imaging results have been reviewed and Nm Myocar Multi W/spect W/wall Motion / Ef  Result Date: 05/21/2019 CLINICAL DATA:  Chest pain, myocardial infarction, hypertension,  hyperlipidemia. EXAM: MYOCARDIAL IMAGING WITH SPECT (REST AND PHARMACOLOGIC-STRESS) GATED LEFT VENTRICULAR WALL MOTION STUDY LEFT VENTRICULAR EJECTION FRACTION TECHNIQUE: Standard myocardial SPECT imaging was performed after resting intravenous injection of 10 mCi Tc-52m tetrofosmin. Subsequently, intravenous infusion of Lexiscan was performed under the supervision of the Cardiology staff. At peak effect of the drug, 30 mCi Tc-68m tetrofosmin was injected intravenously and standard myocardial SPECT imaging was performed. Quantitative gated imaging was also performed to evaluate left ventricular wall motion, and estimate left ventricular ejection fraction. COMPARISON:  None. FINDINGS: Perfusion: Mild (approximately 10%) reduction in activity in the inferior wall on stress images compared to rest, moderate size of lesion, compatible with inducible ischemia. There is also matched reduced activity compatible with scarring in the inferior wall at the cardiac base. Wall Motion: Moderate septal hypokinesis. No left ventricular dilation. Left Ventricular Ejection Fraction: 54 % End diastolic volume 58 ml End systolic volume 27 ml IMPRESSION: 1. Moderate sized region of mild inducible ischemia in the inferior wall on stress images compared to rest images, with adjacent scarring at the inferior base. 2.  Moderate septal hypokinesis. 3. Left ventricular ejection fraction 54% 4. Non invasive risk stratification*: Intermediate *2012 Appropriate Use Criteria for Coronary Revascularization Focused Update: J Am Coll Cardiol. [id number]. [email or web address]://[email or web address] Electronically Signed   By: Van Clines M.D.   On: 05/21/2019 15:41   Dg Chest Port 1 View  Result Date: 05/20/2019 CLINICAL DATA:  Chest pain. EXAM: PORTABLE CHEST 1 VIEW COMPARISON:  Radiograph March 23, 2019. FINDINGS: Stable cardiomediastinal silhouette. Atherosclerosis of thoracic aorta is  noted. No pneumothorax or  pleural effusion is noted. Both lungs are clear. The visualized skeletal structures are unremarkable. IMPRESSION: No active disease. Aortic Atherosclerosis (ICD10-I70.0). Electronically Signed   By: Marijo Conception M.D.   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  Status post PCI to left circumflex. Hypertension. Diabetes mellitus. Hyperlipidemia. Chronic kidney disease stage II. Degenerative joint disease. History of hemachromatosis. History of remote tobacco abuse Plan Discussed with patient at length regarding nuclear stress test result and left cardiac catheterization possible PTCA stenting its risk and benefits i.e. death MI stroke need for emergency CABG local vascular complications worsening renal function risk of restenosis etc. and consents for PCI.  LOS: 0 days    [name] 05/21/2019, 5:03 PM

## 2019-05-21 NOTE — Progress Notes (Signed)
ANTICOAGULATION CONSULT NOTE - Follow Up Consult  Pharmacy Consult for Heparin Indication: chest pain/ACS  Allergies  Allergen Reactions  . Bee Venom Anaphylaxis  . Chocolate Other (See Comments)    Causes migraines.  . Cucumber Extract Other (See Comments)    Cucumbers cause migraine headaches  . Onion Other (See Comments)    Onions cause migraine headaches  . Orange Fruit [Citrus]   . Penicillins Other (See Comments)    "just not effective" Did it involve swelling of the face/tongue/throat, SOB, or low BP? No Did it involve sudden or severe rash/hives, skin peeling, or any reaction on the inside of your mouth or nose? No Did you need to seek medical attention at a hospital or doctor's office? No When did it last happen? If all above answers are "NO", may proceed with cephalosporin use.     Patient Measurements: Height: 5\' 7"  (170.2 cm) Weight: 176 lb 14.4 oz (80.2 kg) IBW/kg (Calculated) : 66.1 Heparin Dosing Weight: 83.3 kg  Vital Signs: Temp: 97.3 F (36.3 C) (09/15 0730) Temp Source: Oral (09/15 0730) BP: 141/86 (09/15 0940) Pulse Rate: 59 (09/15 0730)  Labs: Recent Labs    05/20/19 0656 05/20/19 0916 05/20/19 1214 05/20/19 1450 05/20/19 1950 05/21/19 0648  HGB 13.1  --   --   --   --  13.6  HCT 38.3*  --   --   --   --  39.9  PLT 251  --   --   --   --  245  HEPARINUNFRC  --   --   --   --  0.60 0.53  CREATININE 1.43*  --   --   --   --  1.42*  TROPONINIHS 7 9 9 8   --   --     Estimated Creatinine Clearance: 42.8 mL/min (A) (by C-G formula based on SCr of 1.42 mg/dL (H)).  Assessment:  Anticoag: Heparin for ACS - Hgb 13.6, plts 245, no AC PTA. HL 0.53 in goal  Goal of Therapy:  Heparin level 0.3-0.7 units/ml Monitor platelets by anticoagulation protocol: Yes   Plan:  Continue IV heparin at 850 units/hr Daily HL and CBC   Avrohom Mckelvin S. Alford Highland, PharmD, BCPS Clinical Staff Pharmacisy Eilene Ghazi Stillinger 05/21/2019,9:47 AM

## 2019-05-21 NOTE — H&P (View-Only) (Signed)
Subjective:  Complains of vague precordial chest pain radiating to inner side of the arm earlier this morning underwent Lexiscan Myoview which showed moderate size mild inferior wall ischemia with EF of 54% noninvasive risk stratification intermediate.  Objective:  Vital Signs in the last 24 hours: Temp:  [97.3 F (36.3 C)-98.3 F (36.8 C)] 98.3 F (36.8 C) (09/15 1602) Pulse Rate:  [59-75] 65 (09/15 1602) Resp:  [18-20] 18 (09/15 1602) BP: (96-164)/(68-101) 96/68 (09/15 1602) SpO2:  [94 %-100 %] 98 % (09/15 1602) Weight:  [80.2 kg] 80.2 kg (09/15 0707)  Intake/Output from previous day: 09/14 0701 - 09/15 0700 In: 1535.3 [P.O.:480; I.V.:1055.3] Out: 1750 [Urine:1750] Intake/Output from this shift: Total I/O In: 240 [P.O.:240] Out: 1100 [Urine:1100]  Physical Exam: Neck: no adenopathy, no carotid bruit, no JVD and supple, symmetrical, trachea midline Lungs: clear to auscultation bilaterally Heart: regular rate and rhythm, S1, S2 normal and Soft systolic murmur noted no S3 gallop Abdomen: soft, non-tender; bowel sounds normal; no masses,  no organomegaly Extremities: extremities normal, atraumatic, no cyanosis or edema  Lab Results: Recent Labs    05/20/19 0656 05/21/19 0648  WBC 7.3 6.8  HGB 13.1 13.6  PLT 251 245   Recent Labs    05/20/19 0656 05/21/19 0648  NA 140 142  K 4.6 4.3  CL 110 109  CO2 21* 25  GLUCOSE 156* 123*  BUN 30* 25*  CREATININE 1.43* 1.42*   No results for input(s): TROPONINI in the last 72 hours.  Invalid input(s): CK, MB Hepatic Function Panel No results for input(s): PROT, ALBUMIN, AST, ALT, ALKPHOS, BILITOT, BILIDIR, IBILI in the last 72 hours. Recent Labs    05/21/19 0648  CHOL 124   No results for input(s): PROTIME in the last 72 hours.  Imaging: Imaging results have been reviewed and Nm Myocar Multi W/spect W/wall Motion / Ef  Result Date: 05/21/2019 CLINICAL DATA:  Chest pain, myocardial infarction, hypertension,  hyperlipidemia. EXAM: MYOCARDIAL IMAGING WITH SPECT (REST AND PHARMACOLOGIC-STRESS) GATED LEFT VENTRICULAR WALL MOTION STUDY LEFT VENTRICULAR EJECTION FRACTION TECHNIQUE: Standard myocardial SPECT imaging was performed after resting intravenous injection of 10 mCi Tc-56m tetrofosmin. Subsequently, intravenous infusion of Lexiscan was performed under the supervision of the Cardiology staff. At peak effect of the drug, 30 mCi Tc-98m tetrofosmin was injected intravenously and standard myocardial SPECT imaging was performed. Quantitative gated imaging was also performed to evaluate left ventricular wall motion, and estimate left ventricular ejection fraction. COMPARISON:  None. FINDINGS: Perfusion: Mild (approximately 10%) reduction in activity in the inferior wall on stress images compared to rest, moderate size of lesion, compatible with inducible ischemia. There is also matched reduced activity compatible with scarring in the inferior wall at the cardiac base. Wall Motion: Moderate septal hypokinesis. No left ventricular dilation. Left Ventricular Ejection Fraction: 54 % End diastolic volume 58 ml End systolic volume 27 ml IMPRESSION: 1. Moderate sized region of mild inducible ischemia in the inferior wall on stress images compared to rest images, with adjacent scarring at the inferior base. 2.  Moderate septal hypokinesis. 3. Left ventricular ejection fraction 54% 4. Non invasive risk stratification*: Intermediate *2012 Appropriate Use Criteria for Coronary Revascularization Focused Update: J Am Coll Cardiol. [id number]. [email or web address]://[email or web address] Electronically Signed   By: Van Clines M.D.   On: 05/21/2019 15:41   Dg Chest Port 1 View  Result Date: 05/20/2019 CLINICAL DATA:  Chest pain. EXAM: PORTABLE CHEST 1 VIEW COMPARISON:  Radiograph March 23, 2019. FINDINGS: Stable cardiomediastinal silhouette. Atherosclerosis of thoracic aorta is  noted. No pneumothorax or  pleural effusion is noted. Both lungs are clear. The visualized skeletal structures are unremarkable. IMPRESSION: No active disease. Aortic Atherosclerosis (ICD10-I70.0). Electronically Signed   By: Marijo Conception M.D.   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  Status post PCI to left circumflex. Hypertension. Diabetes mellitus. Hyperlipidemia. Chronic kidney disease stage II. Degenerative joint disease. History of hemachromatosis. History of remote tobacco abuse Plan Discussed with patient at length regarding nuclear stress test result and left cardiac catheterization possible PTCA stenting its risk and benefits i.e. death MI stroke need for emergency CABG local vascular complications worsening renal function risk of restenosis etc. and consents for PCI.  LOS: 0 days    [name] 05/21/2019, 5:03 PM

## 2019-05-22 ENCOUNTER — Encounter (HOSPITAL_COMMUNITY): Payer: Self-pay | Admitting: Cardiology

## 2019-05-22 ENCOUNTER — Encounter (HOSPITAL_COMMUNITY)
Admission: EM | Disposition: A | Discharge status: Home / Self Care | Payer: Self-pay | Source: Home / Self Care | Attending: Cardiology

## 2019-05-22 ENCOUNTER — Encounter (HOSPITAL_COMMUNITY): Payer: Medicare Other

## 2019-05-22 ENCOUNTER — Ambulatory Visit (HOSPITAL_COMMUNITY): Admission: RE | Admit: 2019-05-22 | Payer: Medicare Other | Source: Home / Self Care | Admitting: Cardiology

## 2019-05-22 DIAGNOSIS — Z96652 Presence of left artificial knee joint: Secondary | ICD-10-CM | POA: Diagnosis present

## 2019-05-22 DIAGNOSIS — M1990 Unspecified osteoarthritis, unspecified site: Secondary | ICD-10-CM | POA: Diagnosis present

## 2019-05-22 DIAGNOSIS — Z7902 Long term (current) use of antithrombotics/antiplatelets: Secondary | ICD-10-CM | POA: Diagnosis not present

## 2019-05-22 DIAGNOSIS — I200 Unstable angina: Secondary | ICD-10-CM | POA: Diagnosis present

## 2019-05-22 DIAGNOSIS — Z7982 Long term (current) use of aspirin: Secondary | ICD-10-CM | POA: Diagnosis not present

## 2019-05-22 DIAGNOSIS — I2589 Other forms of chronic ischemic heart disease: Secondary | ICD-10-CM | POA: Diagnosis present

## 2019-05-22 DIAGNOSIS — Z833 Family history of diabetes mellitus: Secondary | ICD-10-CM | POA: Diagnosis not present

## 2019-05-22 DIAGNOSIS — I25118 Atherosclerotic heart disease of native coronary artery with other forms of angina pectoris: Secondary | ICD-10-CM | POA: Diagnosis present

## 2019-05-22 DIAGNOSIS — E785 Hyperlipidemia, unspecified: Secondary | ICD-10-CM | POA: Diagnosis present

## 2019-05-22 DIAGNOSIS — Z955 Presence of coronary angioplasty implant and graft: Secondary | ICD-10-CM | POA: Diagnosis not present

## 2019-05-22 DIAGNOSIS — Z79899 Other long term (current) drug therapy: Secondary | ICD-10-CM | POA: Diagnosis not present

## 2019-05-22 DIAGNOSIS — Z20828 Contact with and (suspected) exposure to other viral communicable diseases: Secondary | ICD-10-CM | POA: Diagnosis present

## 2019-05-22 DIAGNOSIS — Z8249 Family history of ischemic heart disease and other diseases of the circulatory system: Secondary | ICD-10-CM | POA: Diagnosis not present

## 2019-05-22 DIAGNOSIS — Z87891 Personal history of nicotine dependence: Secondary | ICD-10-CM | POA: Diagnosis not present

## 2019-05-22 DIAGNOSIS — E1122 Type 2 diabetes mellitus with diabetic chronic kidney disease: Secondary | ICD-10-CM | POA: Diagnosis present

## 2019-05-22 DIAGNOSIS — Z880 Allergy status to penicillin: Secondary | ICD-10-CM | POA: Diagnosis not present

## 2019-05-22 DIAGNOSIS — Z7984 Long term (current) use of oral hypoglycemic drugs: Secondary | ICD-10-CM | POA: Diagnosis not present

## 2019-05-22 DIAGNOSIS — I129 Hypertensive chronic kidney disease with stage 1 through stage 4 chronic kidney disease, or unspecified chronic kidney disease: Secondary | ICD-10-CM | POA: Diagnosis present

## 2019-05-22 DIAGNOSIS — I252 Old myocardial infarction: Secondary | ICD-10-CM | POA: Diagnosis not present

## 2019-05-22 DIAGNOSIS — N182 Chronic kidney disease, stage 2 (mild): Secondary | ICD-10-CM | POA: Diagnosis present

## 2019-05-22 HISTORY — PX: LEFT HEART CATH AND CORONARY ANGIOGRAPHY: CATH118249

## 2019-05-22 LAB — GLUCOSE, CAPILLARY
Glucose-Capillary: 121 mg/dL — ABNORMAL HIGH (ref 70–99)
Glucose-Capillary: 98 mg/dL (ref 70–99)
Glucose-Capillary: 105 mg/dL — ABNORMAL HIGH (ref 70–99)

## 2019-05-22 LAB — HEPARIN LEVEL (UNFRACTIONATED): Heparin Unfractionated: 0.41 IU/mL (ref 0.30–0.70)

## 2019-05-22 LAB — CBC
HCT: 34 % — ABNORMAL LOW (ref 39.0–52.0)
Hemoglobin: 12.1 g/dL — ABNORMAL LOW (ref 13.0–17.0)
MCH: 33.7 pg (ref 26.0–34.0)
MCHC: 35.6 g/dL (ref 30.0–36.0)
MCV: 94.7 fL (ref 80.0–100.0)
Platelets: 221 10*3/uL (ref 150–400)
RBC: 3.59 MIL/uL — ABNORMAL LOW (ref 4.22–5.81)
RDW: 12.6 % (ref 11.5–15.5)
WBC: 7.4 10*3/uL (ref 4.0–10.5)
nRBC: 0 % (ref 0.0–0.2)

## 2019-05-22 LAB — BASIC METABOLIC PANEL
Anion gap: 8 (ref 5–15)
BUN: 25 mg/dL — ABNORMAL HIGH (ref 8–23)
CO2: 23 mmol/L (ref 22–32)
Calcium: 8.6 mg/dL — ABNORMAL LOW (ref 8.9–10.3)
Chloride: 111 mmol/L (ref 98–111)
Creatinine, Ser: 1.39 mg/dL — ABNORMAL HIGH (ref 0.61–1.24)
GFR calc Af Amer: 55 mL/min — ABNORMAL LOW (ref >60)
GFR calc non Af Amer: 48 mL/min — ABNORMAL LOW (ref >60)
Glucose, Bld: 114 mg/dL — ABNORMAL HIGH (ref 70–99)
Potassium: 3.9 mmol/L (ref 3.5–5.1)
Sodium: 142 mmol/L (ref 135–145)

## 2019-05-22 LAB — POCT ACTIVATED CLOTTING TIME: Activated Clotting Time: 136 seconds

## 2019-05-22 SURGERY — LEFT HEART CATH AND CORONARY ANGIOGRAPHY
Anesthesia: LOCAL

## 2019-05-22 MED ORDER — ONDANSETRON HCL 4 MG/2ML IJ SOLN: 4.0000 mg | Freq: Four times a day (QID) | INTRAMUSCULAR | Status: DC | PRN

## 2019-05-22 MED ORDER — MIDAZOLAM HCL 2 MG/2ML IJ SOLN
INTRAMUSCULAR | Status: AC
  Filled 2019-05-22: qty 2

## 2019-05-22 MED ORDER — LIDOCAINE HCL (PF) 1 % IJ SOLN
INTRAMUSCULAR | Status: DC | PRN
  Administered 2019-05-22: 10 mL

## 2019-05-22 MED ORDER — METFORMIN HCL 500 MG PO TABS: 500.0000 mg | ORAL_TABLET | Freq: Two times a day (BID) | ORAL | 0 refills | Status: AC

## 2019-05-22 MED ORDER — LIDOCAINE HCL (PF) 1 % IJ SOLN
INTRAMUSCULAR | Status: AC
  Filled 2019-05-22: qty 30

## 2019-05-22 MED ORDER — SODIUM CHLORIDE 0.9% FLUSH: 3.0000 mL | Freq: Two times a day (BID) | INTRAVENOUS | Status: DC

## 2019-05-22 MED ORDER — HEPARIN (PORCINE) IN NACL 1000-0.9 UT/500ML-% IV SOLN
INTRAVENOUS | Status: DC | PRN
  Administered 2019-05-22 (×2): 500 mL

## 2019-05-22 MED ORDER — SODIUM CHLORIDE 0.9 % IV SOLN
INTRAVENOUS | Status: AC
  Administered 2019-05-22: 14:00:00 via INTRAVENOUS

## 2019-05-22 MED ORDER — FENTANYL CITRATE (PF) 100 MCG/2ML IJ SOLN
INTRAMUSCULAR | Status: DC | PRN
  Administered 2019-05-22: 25 ug via INTRAVENOUS

## 2019-05-22 MED ORDER — SODIUM CHLORIDE 0.9% FLUSH: 3.0000 mL | INTRAVENOUS | Status: DC | PRN

## 2019-05-22 MED ORDER — HEPARIN (PORCINE) IN NACL 1000-0.9 UT/500ML-% IV SOLN
INTRAVENOUS | Status: AC
  Filled 2019-05-22: qty 1000

## 2019-05-22 MED ORDER — MIDAZOLAM HCL 2 MG/2ML IJ SOLN
INTRAMUSCULAR | Status: DC | PRN
  Administered 2019-05-22: 1 mg via INTRAVENOUS

## 2019-05-22 MED ORDER — IOHEXOL 350 MG/ML SOLN
INTRAVENOUS | Status: DC | PRN
  Administered 2019-05-22: 45 mL

## 2019-05-22 MED ORDER — SODIUM CHLORIDE 0.9 % IV SOLN: 250.0000 mL | INTRAVENOUS | Status: DC | PRN

## 2019-05-22 MED ORDER — FENTANYL CITRATE (PF) 100 MCG/2ML IJ SOLN
INTRAMUSCULAR | Status: AC
  Filled 2019-05-22: qty 2

## 2019-05-22 MED ORDER — ADENOSINE 6 MG/2ML IV SOLN
INTRAVENOUS | Status: AC
  Filled 2019-05-22: qty 2

## 2019-05-22 MED ORDER — ACETAMINOPHEN 325 MG PO TABS: 650.0000 mg | ORAL_TABLET | ORAL | Status: DC | PRN

## 2019-05-22 SURGICAL SUPPLY — 7 items
CATH INFINITI 5FR MULTPACK ANG (CATHETERS) ×1 IMPLANT
KIT HEART LEFT (KITS) ×2 IMPLANT
PACK CARDIAC CATHETERIZATION (CUSTOM PROCEDURE TRAY) ×2 IMPLANT
SHEATH PINNACLE 5F 10CM (SHEATH) ×1 IMPLANT
SYR MEDRAD MARK 7 150ML (SYRINGE) ×1 IMPLANT
TRANSDUCER W/STOPCOCK (MISCELLANEOUS) ×2 IMPLANT
WIRE EMERALD 3MM-J .035X150CM (WIRE) ×1 IMPLANT

## 2019-05-22 NOTE — Interval H&P Note (Signed)
History and Physical Interval Note:  05/22/2019 8:27 AM  Jeffery Carpenter  has presented today for surgery, with the diagnosis of UNSTABLE ANGINA.  The various methods of treatment have been discussed with the patient and family. After consideration of risks, benefits and other options for treatment, the patient has consented to  Procedure(s): LEFT HEART CATH AND CORONARY ANGIOGRAPHY (N/A) as a surgical intervention.  The patient's history has been reviewed, patient examined, no change in status, stable for surgery.  I have reviewed the patient's chart and labs.  Questions were answered to the patient's satisfaction.     [name]

## 2019-05-22 NOTE — Progress Notes (Signed)
Pt left unit to cath lab.

## 2019-05-22 NOTE — Discharge Instructions (Signed)
Coronary Angiogram °A coronary angiogram is an X-ray procedure that is used to examine the arteries in the heart. In this procedure, a dye (contrast dye) is injected through a long, thin tube (catheter). The catheter is inserted through the groin, wrist, or arm. The dye is injected into each artery, then X-rays are taken to show if there is a blockage in the arteries of the heart. This procedure can also show if you have valve disease or a disease of the aorta, and it can be used to check the overall function of your heart muscle. You may have a coronary angiogram if: °· You are having chest pain, or other symptoms of angina, and you are at risk for heart disease. °· You have an abnormal electrocardiogram (ECG) or stress test. °· You have chest pain and heart failure. °· You are having irregular heart rhythms. °· You and your health care provider determine that the benefits of the test information outweigh the risks of the procedure. °Let your health care provider know about: °· Any allergies you have, including allergies to contrast dye. °· All medicines you are taking, including vitamins, herbs, eye drops, creams, and over-the-counter medicines. °· Any problems you or family members have had with anesthetic medicines. °· Any blood disorders you have. °· Any surgeries you have had. °· History of kidney problems or kidney failure. °· Any medical conditions you have. °· Whether you are pregnant or may be pregnant. °What are the risks? °Generally, this is a safe procedure. However, problems may occur, including: °· Infection. °· Allergic reaction to medicines or dyes that are used. °· Bleeding from the access site or other locations. °· Kidney injury, especially in people with impaired kidney function. °· Stroke (rare). °· Heart attack (rare). °· Damage to other structures or organs. °What happens before the procedure? °Staying hydrated °Follow instructions from your health care provider about hydration, which may  include: °· Up to 2 hours before the procedure - you may continue to drink clear liquids, such as water, clear fruit juice, black coffee, and plain tea. °Eating and drinking restrictions °Follow instructions from your health care provider about eating and drinking, which may include: °· 8 hours before the procedure - stop eating heavy meals or foods such as meat, fried foods, or fatty foods. °· 6 hours before the procedure - stop eating light meals or foods, such as toast or cereal. °· 2 hours before the procedure - stop drinking clear liquids. °General instructions °· Ask your health care provider about: °? Changing or stopping your regular medicines. This is especially important if you are taking diabetes medicines or blood thinners. °? Taking medicines such as ibuprofen. These medicines can thin your blood. Do not take these medicines before your procedure if your health care provider instructs you not to, though aspirin may be recommended prior to coronary angiograms. °· Plan to have someone take you home from the hospital or clinic. °· You may need to have blood tests or X-rays done. °What happens during the procedure? °· An IV tube will be inserted into one of your veins. °· You will be given one or more of the following: °? A medicine to help you relax (sedative). °? A medicine to numb the area where the catheter will be inserted into an artery (local anesthetic). °· To reduce your risk of infection: °? Your health care team will wash or sanitize their hands. °? Your skin will be washed with soap. °? Hair may be removed   from the area where the catheter will be inserted.  You will be connected to a continuous ECG monitor.  The catheter will be inserted into an artery. The location may be in your groin, in your wrist, or in the fold of your arm (near your elbow).  A type of X-ray (fluoroscopy) will be used to help guide the catheter to the opening of the blood vessel that is being examined.  A dye will  be injected into the catheter, and X-rays will be taken. The dye will help to show where any narrowing or blockages are located in the heart arteries.  Tell your health care provider if you have any chest pain or trouble breathing during the procedure.  If blockages are found, your health care provider may perform another procedure, such as inserting a coronary stent. The procedure may vary among health care providers and hospitals. What happens after the procedure?  After the procedure, you will need to keep the area still for a few hours, or for as long as told by your health care provider. If the procedure is done through the groin, you will be instructed to not bend and not cross your legs.  The insertion site will be checked frequently.  The pulse in your foot or wrist will be checked frequently.  You may have additional blood tests, X-rays, and a test that records the electrical activity of your heart (ECG).  Do not drive for 24 hours if you were given a sedative. Summary  A coronary angiogram is an X-ray procedure that is used to look into the arteries in the heart.  During the procedure, a dye (contrast dye) is injected through a long, thin tube (catheter). The catheter is inserted through the groin, wrist, or arm.  Tell your health care provider about any allergies you have, including allergies to contrast dye.  After the procedure, you will need to keep the area still for a few hours, or for as long as told by your health care provider. This information is not intended to replace advice given to you by your health care provider. Make sure you discuss any questions you have with your health care provider. Document Released: 02/26/2003 Document Revised: 08/04/2017 Document Reviewed: 06/03/2016 Elsevier Patient Education  [employer]. Angina  Angina is extreme discomfort in the chest, neck, arm, jaw, or back. The discomfort is caused by a lack of blood in the middle layer  of the heart wall (myocardium). There are four types of angina:  Stable angina. This is triggered by vigorous activity or exercise. It goes away when you rest or take angina medicine.  Unstable angina. This is a warning sign and can lead to a heart attack (acute coronary syndrome). This is a medical emergency. Symptoms come at rest and last a long time.  Microvascular angina. This affects the small coronary arteries. Symptoms include feeling tired and being short of breath.  Prinzmetal or variant angina. This is caused by a tightening (spasm) of the arteries that go to your heart. What are the causes? This condition is caused by atherosclerosis. This is the buildup of fat and cholesterol (plaque) in your arteries. The plaque may narrow or block the artery. Other causes of angina include:  Sudden tightening of the muscles of the arteries in the heart (coronary spasm).  Small artery disease (microvascular dysfunction).  Problems with any of your heart valves (heart valve disease).  A tear in an artery in your heart (coronary artery dissection).  Diseases of the heart muscle (cardiomyopathy), or other heart diseases. What increases the risk? You are more likely to develop this condition if you have:  High cholesterol.  High blood pressure (hypertension).  Diabetes.  A family history of heart disease.  An inactive (sedentary) lifestyle, or you do not exercise enough.  Depression.  Had radiation treatment to the left side of your chest. Other risk factors include:  Using tobacco.  Being obese.  Eating a diet high in saturated fats.  Being exposed to high stress or triggers of stress.  Using drugs, such as cocaine. Women have a greater risk for angina if:  They are older than 65.  They have gone through menopause (are postmenopausal). What are the signs or symptoms? Common symptoms of this condition in both men and women may include:  Chest pain, which may: ? Feel  like a crushing or squeezing in the chest, or like a tightness, pressure, fullness, or heaviness in the chest. ? Last for more than a few minutes at a time, or it may stop and come back (recur) over the course of a few minutes.  Pain in the neck, arm, jaw, or back.  Unexplained heartburn or indigestion.  Shortness of breath.  Nausea.  Sudden cold sweats. Women and people with diabetes may have unusual (atypical) symptoms, such as:  Fatigue.  Unexplained feelings of nervousness or anxiety.  Unexplained weakness.  Dizziness or fainting. How is this diagnosed? This condition may be diagnosed based on:  Your symptoms and medical history.  Electrocardiogram (ECG) to measure the electrical activity in your heart.  Blood tests.  Stress test to look for signs of blockage when your heart is stressed.  CT angiogram to examine your heart and the blood flow to it.  Coronary angiogram to check your coronary arteries for blockage. How is this treated? Angina may be treated with:  Medicines to: ? Prevent blood clots and heart attack. ? Relax blood vessels and improve blood flow to the heart (nitrates). ? Reduce blood pressure, improve the pumping action of the heart, and relax blood vessels that are spasming. ? Reduce cholesterol and help treat atherosclerosis.  A procedure to widen a narrowed or blocked coronary artery (angioplasty). A mesh tube may be placed in a coronary artery to keep it open (coronary stenting).  Surgery to allow blood to go around a blocked artery (coronary artery bypass surgery). Follow these instructions at home: Medicines  Take over-the-counter and prescription medicines only as told by your health care provider.  Do not take the following medicines unless your health care provider approves: ? NSAIDs, such as ibuprofen or naproxen. ? Vitamin supplements that contain vitamin A, vitamin E, or both. ? Hormone replacement therapy that contains estrogen  with or without progestin. Eating and drinking   Eat a heart-healthy diet. This includes plenty of fresh fruits and vegetables, whole grains, low-fat (lean) protein, and low-fat dairy products.  Follow instructions from your health care provider about eating or drinking restrictions. Activity  Follow an exercise program approved by your health care provider.  Consider joining a cardiac rehabilitation program.  Take a break when you feel fatigued. Plan rest periods in your daily activities. Lifestyle   Do not use any products that contain nicotine or tobacco, such as cigarettes, e-cigarettes, and chewing tobacco. If you need help quitting, ask your health care provider.  If your health care provider says you can drink alcohol: ? Limit how much you use to:  0-1 drink a  day for nonpregnant women.  0-2 drinks a day for men. ? Be aware of how much alcohol is in your drink. In the [place] one drink equals one 12 oz bottle of beer (355 mL), one 5 oz glass of wine (148 mL), or one 1 oz glass of hard liquor (44 mL). General instructions  Maintain a healthy weight.  Learn to manage stress.  Keep your vaccinations up to date. Get the flu (influenza) vaccine every year.  Talk to your health care provider if you feel depressed. Take a depression screening test to see if you are at risk for depression.  Work with your health care provider to manage other health conditions, such as hypertension or diabetes.  Keep all follow-up visits as told by your health care provider. This is important. Get help right away if:  You have pain in your chest, neck, arm, jaw, or back, and the pain: ? Lasts more than a few minutes. ? Is recurring. ? Is not relieved by taking medicines under the tongue (sublingual nitroglycerin). ? Increases in intensity or frequency.  You have a lot of sweating without cause.  You have unexplained: ? Heartburn or indigestion. ? Shortness of breath or difficulty  breathing. ? Nausea or vomiting. ? Fatigue. ? Feelings of nervousness or anxiety. ? Weakness.  You have sudden light-headedness or dizziness.  You faint. These symptoms may represent a serious problem that is an emergency. Do not wait to see if the symptoms will go away. Get medical help right away. Call your local emergency services (911 in the [place] Do not drive yourself to the hospital. Summary  Angina is extreme discomfort in the chest, neck, arm, jaw, or back that is caused by a lack of blood in the heart wall.  There are many symptoms of angina. They include chest pain, unexplained heartburn or indigestion, sudden cold sweats, and fatigue.  Angina may be treated with behavioral changes, medicine, or surgery.  Symptoms of angina may represent an emergency. Get medical help right away. Call your local emergency services (911 in the [place] Do not drive yourself to the hospital. This information is not intended to replace advice given to you by your health care provider. Make sure you discuss any questions you have with your health care provider. Document Released: 08/22/2005 Document Revised: 04/09/2018 Document Reviewed: 04/09/2018 Elsevier Patient Education  2020 [employer].

## 2019-05-22 NOTE — Discharge Summary (Signed)
Discharge summary dictated on 05/22/2019 dictation number is [phone]

## 2019-05-22 NOTE — Interval H&P Note (Signed)
History and Physical Interval Note:  05/22/2019 8:28 AM  Jeffery Carpenter  has presented today for surgery, with the diagnosis of UNSTABLE ANGINA.  The various methods of treatment have been discussed with the patient and family. After consideration of risks, benefits and other options for treatment, the patient has consented to  Procedure(s): LEFT HEART CATH AND CORONARY ANGIOGRAPHY (N/A) as a surgical intervention.  The patient's history has been reviewed, patient examined, no change in status, stable for surgery.  I have reviewed the patient's chart and labs.  Questions were answered to the patient's satisfaction.     [name]

## 2019-05-22 NOTE — Interval H&P Note (Signed)
Cath Lab Visit (complete for each Cath Lab visit)  Clinical Evaluation Leading to the Procedure:   ACS: No.  Non-ACS:    Anginal Classification: CCS IV  Anti-ischemic medical therapy: Maximal Therapy (2 or more classes of medications)  Non-Invasive Test Results: Intermediate-risk stress test findings: cardiac mortality 1-3%/year  Prior CABG: No previous CABG      History and Physical Interval Note:  05/22/2019 8:30 AM  Jeffery Carpenter  has presented today for surgery, with the diagnosis of UNSTABLE ANGINA.  The various methods of treatment have been discussed with the patient and family. After consideration of risks, benefits and other options for treatment, the patient has consented to  Procedure(s): LEFT HEART CATH AND CORONARY ANGIOGRAPHY (N/A) as a surgical intervention.  The patient's history has been reviewed, patient examined, no change in status, stable for surgery.  I have reviewed the patient's chart and labs.  Questions were answered to the patient's satisfaction.     Jeffery Carpenter

## 2019-05-23 ENCOUNTER — Telehealth (HOSPITAL_COMMUNITY): Payer: Self-pay | Admitting: Family Medicine

## 2019-05-23 ENCOUNTER — Telehealth (HOSPITAL_COMMUNITY): Payer: Self-pay | Admitting: *Deleted

## 2019-05-23 LAB — GLUCOSE, CAPILLARY: Glucose-Capillary: 104 mg/dL — ABNORMAL HIGH (ref 70–99)

## 2019-05-23 NOTE — Progress Notes (Signed)
Cardiac Individual Treatment Plan  Patient Details  Name: Jeffery Carpenter MRN: [id number] Date of Birth: 29-Jan-1941 Referring Provider:     CARDIAC REHAB PHASE II ORIENTATION from 04/18/2019 in [facility]  Referring Provider  Dr. Terrence Dupont      Initial Encounter Date:    CARDIAC REHAB PHASE II ORIENTATION from 04/18/2019 in [facility]  Date  04/18/19      Visit Diagnosis: NSTEMI (non-ST elevated myocardial infarction) [facility])  Status post coronary artery stent placement  Patient's Home Medications on Admission:  Current Outpatient Medications:  .  acetaminophen (TYLENOL) 500 MG tablet, Take 500 mg by mouth every 6 (six) hours as needed for mild pain, moderate pain or headache., Disp: , Rfl:  .  amLODipine (NORVASC) 5 MG tablet, Take 1 tablet (5 mg total) by mouth daily., Disp: 90 tablet, Rfl: 1 .  aspirin 81 MG chewable tablet, Chew 1 tablet (81 mg total) by mouth daily., Disp: 90 tablet, Rfl: 1 .  atorvastatin (LIPITOR) 80 MG tablet, Take 1 tablet (80 mg total) by mouth daily at 6 PM. (Patient taking differently: Take 40 mg by mouth daily at 6 PM. ), Disp: 90 tablet, Rfl: 1 .  clonazePAM (KLONOPIN) 0.5 MG tablet, Take 0.5 mg by mouth 2 (two) times daily as needed for anxiety. , Disp: , Rfl:  .  clopidogrel (PLAVIX) 75 MG tablet, Take 1 tablet (75 mg total) by mouth daily with breakfast., Disp: 90 tablet, Rfl: 1 .  diphenhydrAMINE (BENADRYL) 25 MG tablet, Take 25 mg by mouth daily as needed for itching (bug bites)., Disp: , Rfl:  .  EPINEPHrine 0.3 mg/0.3 mL IJ SOAJ injection, Inject 0.3 mLs (0.3 mg total) into the muscle as needed for anaphylaxis., Disp: 1 Device, Rfl: 0 .  folic acid (FOLVITE) 1 MG tablet, Take 1 tablet (1 mg total) by mouth daily., Disp: 30 tablet, Rfl: 12 .  [START ON 05/24/2019] metFORMIN (GLUCOPHAGE) 500 MG tablet, Take 1 tablet (500 mg total) by mouth 2 (two) times daily with a meal., Disp: 180 tablet,  Rfl: 0 .  metoprolol tartrate (LOPRESSOR) 25 MG tablet, Take 1 tablet (25 mg total) by mouth 2 (two) times daily. (Patient taking differently: Take 12.5 mg by mouth 2 (two) times daily. ), Disp: 180 tablet, Rfl: 0 .  nitroGLYCERIN (NITROSTAT) 0.4 MG SL tablet, Place 1 tablet (0.4 mg total) under the tongue every 5 (five) minutes as needed for chest pain (CP or SOB)., Disp: 25 tablet, Rfl: 1 .  thiamine (VITAMIN B-1) 50 MG tablet, Take 50 mg by mouth daily.  , Disp: , Rfl:  .  triamcinolone cream (KENALOG) 0.5 %, Apply 1 application topically 3 (three) times daily as needed (itching/ insect bites)., Disp: , Rfl:   Past Medical History: Past Medical History:  Diagnosis Date  . 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 Hemochromatosis   . HTN (hypertension), benign 02/06/2014   pt takes inderal for pre competition calmness  . 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 Myocardial infarction ([place])   . Other and unspecified hyperlipidemia 02/06/2014    Tobacco Use: Social History   Tobacco Use  Smoking Status Former Smoker  . Quit date: 02/07/1960  . 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 [employer] Cardiac and Pulmonary Rehab Latest Ref Rng & Units 03/23/2019 03/24/2019 05/21/2019  Cholestrol 0 - 200 mg/dL - 136 124   LDLCALC 0 - 99 mg/dL - 79 69   HDL >40 mg/dL - 42 41   Trlycerides <150 mg/dL - 74 69   Hemoglobin A1c 4.8 - 5.6 % 6.6(H) - -      Capillary Blood Glucose: Lab Results  Component Value Date   GLUCAP 105 (H) 05/22/2019   GLUCAP 98 05/22/2019   GLUCAP 121 (H) 05/22/2019   GLUCAP 131 (H) 05/21/2019   GLUCAP 99 05/21/2019     Exercise Target Goals: Exercise Program Goal: Individual exercise prescription set using results from initial 6 min walk test and THRR while considering  patient's activity barriers and safety.   Exercise Prescription Goal: Initial exercise prescription builds to 30-45 minutes a day  of aerobic activity, 2-3 days per week.  Home exercise guidelines will be given to patient during program as part of exercise prescription that the participant will acknowledge.  Activity Barriers & Risk Stratification: Activity Barriers & Cardiac Risk Stratification - 04/18/19 1157      Activity Barriers & Cardiac Risk Stratification   Activity Barriers  Balance Concerns;Deconditioning    Cardiac Risk Stratification  High       6 Minute Walk: 6 Minute Walk    Row Name 04/18/19 1157         6 Minute Walk   Phase  Initial     Distance  1000 feet     Walk Time  6 minutes     # of Rest Breaks  0     MPH  1.89     METS  1.65     RPE  11     Perceived Dyspnea   0     VO2 Peak  5.8     Symptoms  No     Resting HR  71 bpm     Resting BP  104/70     Resting Oxygen Saturation   99 %     Exercise Oxygen Saturation  during 6 min walk  99 %     Max Ex. HR  92 bpm     Max Ex. BP  110/68     2 Minute Post BP  102/68        Oxygen Initial Assessment:   Oxygen Re-Evaluation:   Oxygen Discharge (Final Oxygen Re-Evaluation):   Initial Exercise Prescription: Initial Exercise Prescription - 04/18/19 1100      Date of Initial Exercise RX and Referring Provider   Date  04/18/19    Referring Provider  Dr. Terrence Dupont    Expected Discharge Date  05/31/19      NuStep   Level  2    SPM  75    Minutes  15    METs  1.5      Arm Ergometer   Level  1.5    Watts  20    Minutes  15    METs  1.6      Prescription Details   Frequency (times per week)  2    Duration  Progress to 30 minutes of continuous aerobic without signs/symptoms of physical distress      Intensity   THRR 40-80% of Max Heartrate  56-113    Ratings of Perceived Exertion  11-13      Progression   Progression  Continue to progress workloads to maintain intensity without signs/symptoms of physical distress.      Resistance Training   Training Prescription  Yes  Weight  4 lbs.     Reps  10-15        Perform Capillary Blood Glucose checks as needed.  Exercise Prescription Changes: Exercise Prescription Changes    Row Name 04/22/19 1127 05/06/19 1108 05/17/19 1122         Response to Exercise   Blood Pressure (Admit)  102/58  102/76  106/74     Blood Pressure (Exercise)  108/64  118/64  130/66     Blood Pressure (Exit)  90/62 110/72  100/66 110/72  98/70     Heart Rate (Admit)  94 bpm  87 bpm  87 bpm     Heart Rate (Exercise)  111 bpm  113 bpm  128 bpm     Heart Rate (Exit)  86 bpm  77 bpm  97 bpm     Rating of Perceived Exertion (Exercise)  12  12  13      Symptoms  none  none  none     Comments  Low BP after exercise, recovered with hydration.  -  -     Duration  Continue with 30 min of aerobic exercise without signs/symptoms of physical distress.  Continue with 30 min of aerobic exercise without signs/symptoms of physical distress.  Continue with 30 min of aerobic exercise without signs/symptoms of physical distress.     Intensity  THRR unchanged  THRR unchanged  THRR unchanged       Progression   Progression  Continue to progress workloads to maintain intensity without signs/symptoms of physical distress.  Continue to progress workloads to maintain intensity without signs/symptoms of physical distress.  Continue to progress workloads to maintain intensity without signs/symptoms of physical distress.     Average METs  2.6  2.6  3.4       Resistance Training   Training Prescription  Yes  Yes  Yes     Weight  2lbs  4lbs  4lbs     Reps  10-15  10-15  10-15     Time  10 Minutes  10 Minutes  10 Minutes       Interval Training   Interval Training  No  No  No       Recumbant Bike   Level  -  1.5  3     Watts  -  21  33     Minutes  -  15  15     METs  -  2.79  3.2       NuStep   Level  2  3  4      SPM  85  85  85     Minutes  30  30  15      METs  2.6  3.3  3.6       Home Exercise Plan   Plans to continue exercise at  -  -  Home (comment) Walking     Frequency  -  -   Add 4 additional days to program exercise sessions.     Initial Home Exercises Provided  -  -  05/15/19        Exercise Comments: Exercise Comments    Row Name 04/22/19 1209 05/15/19 1220         Exercise Comments  Patient tolerated low intensity exercise well without c/o. BP low after exercise but recovered with hydration, pt asymptomatic.  Reviewed home exercise guidelines and goals with patient.         Exercise Goals and  Review: Exercise Goals    Row Name 04/18/19 1200             Exercise Goals   Increase Physical Activity  Yes       Intervention  Provide advice, education, support and counseling about physical activity/exercise needs.;Develop an individualized exercise prescription for aerobic and resistive training based on initial evaluation findings, risk stratification, comorbidities and participant's personal goals.       Expected Outcomes  Short Term: Attend rehab on a regular basis to increase amount of physical activity.;Long Term: Add in home exercise to make exercise part of routine and to increase amount of physical activity.;Long Term: Exercising regularly at least 3-5 days a week.       Increase Strength and Stamina  Yes       Intervention  Provide advice, education, support and counseling about physical activity/exercise needs.;Develop an individualized exercise prescription for aerobic and resistive training based on initial evaluation findings, risk stratification, comorbidities and participant's personal goals.       Expected Outcomes  Short Term: Increase workloads from initial exercise prescription for resistance, speed, and METs.;Short Term: Perform resistance training exercises routinely during rehab and add in resistance training at home;Long Term: Improve cardiorespiratory fitness, muscular endurance and strength as measured by increased METs and functional capacity (6MWT)       Able to understand and use rate of perceived exertion (RPE) scale  Yes        Intervention  Provide education and explanation on how to use RPE scale       Expected Outcomes  Short Term: Able to use RPE daily in rehab to express subjective intensity level;Long Term:  Able to use RPE to guide intensity level when exercising independently       Knowledge and understanding of Target Heart Rate Range (THRR)  Yes       Intervention  Provide education and explanation of THRR including how the numbers were predicted and where they are located for reference       Expected Outcomes  Short Term: Able to state/look up THRR;Long Term: Able to use THRR to govern intensity when exercising independently;Short Term: Able to use daily as guideline for intensity in rehab       Able to check pulse independently  Yes       Intervention  Provide education and demonstration on how to check pulse in carotid and radial arteries.;Review the importance of being able to check your own pulse for safety during independent exercise       Expected Outcomes  Short Term: Able to explain why pulse checking is important during independent exercise;Long Term: Able to check pulse independently and accurately       Understanding of Exercise Prescription  Yes       Intervention  Provide education, explanation, and written materials on patient's individual exercise prescription       Expected Outcomes  Short Term: Able to explain program exercise prescription;Long Term: Able to explain home exercise prescription to exercise independently          Exercise Goals Re-Evaluation : Exercise Goals Re-Evaluation    Row Name 04/22/19 1209 05/15/19 1220           Exercise Goal Re-Evaluation   Exercise Goals Review  Increase Physical Activity;Able to understand and use rate of perceived exertion (RPE) scale  Increase Physical Activity;Able to understand and use rate of perceived exertion (RPE) scale;Knowledge and understanding of Target Heart Rate Range (THRR);Understanding of Exercise  Prescription;Able to check pulse  independently;Increase Strength and Stamina      Comments  Patient able to understand use RPE scale appropriately.  Reviewed home exercise guidelines with patient including THRR, RPE scale and endpoints for exercise. Pt is able to check his heart rate using cuff at home. Pt is walking 0.5 miles daily and has 2lb, 3lb, and 5lb hand weights that he uses for his resistance training.      Expected Outcomes  Increase workloads as tolerated to help achieve personal health and fitness goals.  Patient will progress workloads at cardiac rehab to help improve strength and stamina.         Discharge Exercise Prescription (Final Exercise Prescription Changes): Exercise Prescription Changes - 05/17/19 1122      Response to Exercise   Blood Pressure (Admit)  106/74    Blood Pressure (Exercise)  130/66    Blood Pressure (Exit)  98/70    Heart Rate (Admit)  87 bpm    Heart Rate (Exercise)  128 bpm    Heart Rate (Exit)  97 bpm    Rating of Perceived Exertion (Exercise)  13    Symptoms  none    Duration  Continue with 30 min of aerobic exercise without signs/symptoms of physical distress.    Intensity  THRR unchanged      Progression   Progression  Continue to progress workloads to maintain intensity without signs/symptoms of physical distress.    Average METs  3.4      Resistance Training   Training Prescription  Yes    Weight  4lbs    Reps  10-15    Time  10 Minutes      Interval Training   Interval Training  No      Recumbant Bike   Level  3    Watts  33    Minutes  15    METs  3.2      NuStep   Level  4    SPM  85    Minutes  15    METs  3.6      Home Exercise Plan   Plans to continue exercise at  Home (comment)   Walking   Frequency  Add 4 additional days to program exercise sessions.    Initial Home Exercises Provided  05/15/19       Nutrition:  Target Goals: Understanding of nutrition guidelines, daily intake of sodium 1500mg , cholesterol 200mg , calories 30% from fat and  7% or less from saturated fats, daily to have 5 or more servings of fruits and vegetables.  Biometrics: Pre Biometrics - 04/18/19 1200      Pre Biometrics   Height  5' 7.5" (1.715 m)    Weight  81.6 kg    Waist Circumference  40 inches    Hip Circumference  40.5 inches    Waist to Hip Ratio  0.99 %    BMI (Calculated)  27.74    Triceps Skinfold  25 mm    % Body Fat  30.1 %    Grip Strength  35 kg    Flexibility  14 in    Single Leg Stand  3.3 seconds        Nutrition Therapy Plan and Nutrition Goals:   Nutrition Assessments:   Nutrition Goals Re-Evaluation:   Nutrition Goals Re-Evaluation:   Nutrition Goals Discharge (Final Nutrition Goals Re-Evaluation):   Psychosocial: Target Goals: Acknowledge presence or absence of significant depression and/or stress, maximize coping skills,  provide positive support system. Participant is able to verbalize types and ability to use techniques and skills needed for reducing stress and depression.  Initial Review & Psychosocial Screening: Initial Psych Review & Screening - 04/18/19 1205      Initial Review   Current issues with  Current Stress Concerns    Source of Stress Concerns  Unable to participate in former interests or hobbies;Unable to perform yard/household activities;Financial    Comments  [name] states that his cardiologist has limited his ability to do certain things such as mowing the lawn and driving.      Family Dynamics   Good Support System?  Yes   Pt's wife is a source of support for [place].     Barriers   Psychosocial barriers to participate in program  The patient should benefit from training in stress management and relaxation.      Screening Interventions   Interventions  Encouraged to exercise       Quality of Life Scores: Quality of Life - 04/18/19 1201      Quality of Life   Select  Quality of Life      Quality of Life Scores   Health/Function Pre  22.68 %    Socioeconomic Pre  27 %     Psych/Spiritual Pre  23.79 %    Family Pre  25.7 %    GLOBAL Pre  24.11 %      Scores of 19 and below usually indicate a poorer quality of life in these areas.  A difference of  2-3 points is a clinically meaningful difference.  A difference of 2-3 points in the total score of the Quality of Life Index has been associated with significant improvement in overall quality of life, self-image, physical symptoms, and general health in studies assessing change in quality of life.  PHQ-9: Recent Review Flowsheet Data    Depression screen [facility] 2/9 04/22/2019 04/30/2018 04/24/2017 02/29/2016 02/16/2015   Decreased Interest 0 0 0 0 0   Down, Depressed, Hopeless 0 0 0 0 0   PHQ - 2 Score 0 0 0 0 0     Interpretation of Total Score  Total Score Depression Severity:  1-4 = Minimal depression, 5-9 = Mild depression, 10-14 = Moderate depression, 15-19 = Moderately severe depression, 20-27 = Severe depression   Psychosocial Evaluation and Intervention: Psychosocial Evaluation - 04/22/19 1334      Psychosocial Evaluation & Interventions   Interventions  Encouraged to exercise with the program and follow exercise prescription;Stress management education;Relaxation education    Comments  Encouraged patient to participate in program to return be able to return to mowing.  [name] enjoys golfing, fishing, and shootings    Expected Outcomes  Bill report being able to return to mowing his lawn and maintain a positive outlook.    Continue Psychosocial Services   Follow up required by staff       Psychosocial Re-Evaluation: Psychosocial Re-Evaluation    [place] Name 04/25/19 1613 05/22/19 0708           Psychosocial Re-Evaluation   Current issues with  Current Stress Concerns  Current Stress Concerns      Comments  No psychosocial interventions neessary.  [name] is currently admitted for a possible CV workup.  Will follow along for appropriateness to return to CR.      Expected Outcomes  [name] will maintain a  positive outlook with good coping skills and report ability to get back to mowing his lawn.  [name] will maintain a positive outlook despit being readmitted for a possible CV workup.      Interventions  Stress management education;Relaxation education;Encouraged to attend Cardiac Rehabilitation for the exercise  Stress management education;Relaxation education;Encouraged to attend Cardiac Rehabilitation for the exercise      Continue Psychosocial Services   No Follow up required  Follow up required by staff      Comments  [name] states that his cardiologist has limited his ability to do certain things such as mowing the lawn and driving.  -        Initial Review   Source of Stress Concerns  Unable to participate in former interests or hobbies;Unable to perform yard/household activities;Financial  -         Psychosocial Discharge (Final Psychosocial Re-Evaluation): Psychosocial Re-Evaluation - 05/22/19 0708      Psychosocial Re-Evaluation   Current issues with  Current Stress Concerns    Comments  [name] is currently admitted for a possible CV workup.  Will follow along for appropriateness to return to CR.    Expected Outcomes  [name] will maintain a positive outlook despit being readmitted for a possible CV workup.    Interventions  Stress management education;Relaxation education;Encouraged to attend Cardiac Rehabilitation for the exercise    Continue Psychosocial Services   Follow up required by staff       Vocational Rehabilitation: Provide vocational rehab assistance to qualifying candidates.   Vocational Rehab Evaluation & Intervention: Vocational Rehab - 04/18/19 1210      Initial Vocational Rehab Evaluation & Intervention   Assessment shows need for Vocational Rehabilitation  No       Education: Education Goals: Education classes will be provided on a weekly basis, covering required topics. Participant will state understanding/return demonstration of topics presented.  Learning  Barriers/Preferences: Learning Barriers/Preferences - 04/18/19 1202      Learning Barriers/Preferences   Learning Barriers  Sight;Hearing    Learning Preferences  Individual Instruction;Skilled Demonstration       Education Topics: Count Your Pulse:  -Group instruction provided by verbal instruction, demonstration, patient participation and written materials to support subject.  Instructors address importance of being able to find your pulse and how to count your pulse when at home without a heart monitor.  Patients get hands on experience counting their pulse with staff help and individually.   Heart Attack, Angina, and Risk Factor Modification:  -Group instruction provided by verbal instruction, video, and written materials to support subject.  Instructors address signs and symptoms of angina and heart attacks.    Also discuss risk factors for heart disease and how to make changes to improve heart health risk factors.   Functional Fitness:  -Group instruction provided by verbal instruction, demonstration, patient participation, and written materials to support subject.  Instructors address safety measures for doing things around the house.  Discuss how to get up and down off the floor, how to pick things up properly, how to safely get out of a chair without assistance, and balance training.   Meditation and Mindfulness:  -Group instruction provided by verbal instruction, patient participation, and written materials to support subject.  Instructor addresses importance of mindfulness and meditation practice to help reduce stress and improve awareness.  Instructor also leads participants through a meditation exercise.    Stretching for Flexibility and Mobility:  -Group instruction provided by verbal instruction, patient participation, and written materials to support subject.  Instructors lead participants through series of stretches that are designed to increase  flexibility thus improving  mobility.  These stretches are additional exercise for major muscle groups that are typically performed during regular warm up and cool down.   Hands Only CPR:  -Group verbal, video, and participation provides a basic overview of AHA guidelines for community CPR. Role-play of emergencies allow participants the opportunity to practice calling for help and chest compression technique with discussion of AED use.   Hypertension: -Group verbal and written instruction that provides a basic overview of hypertension including the most recent diagnostic guidelines, risk factor reduction with self-care instructions and medication management.    Nutrition I class: Heart Healthy Eating:  -Group instruction provided by PowerPoint slides, verbal discussion, and written materials to support subject matter. The instructor gives an explanation and review of the Therapeutic Lifestyle Changes diet recommendations, which includes a discussion on lipid goals, dietary fat, sodium, fiber, plant stanol/sterol esters, sugar, and the components of a well-balanced, healthy diet.   Nutrition II class: Lifestyle Skills:  -Group instruction provided by PowerPoint slides, verbal discussion, and written materials to support subject matter. The instructor gives an explanation and review of label reading, grocery shopping for heart health, heart healthy recipe modifications, and ways to make healthier choices when eating out.   Diabetes Question & Answer:  -Group instruction provided by PowerPoint slides, verbal discussion, and written materials to support subject matter. The instructor gives an explanation and review of diabetes co-morbidities, pre- and post-prandial blood glucose goals, pre-exercise blood glucose goals, signs, symptoms, and treatment of hypoglycemia and hyperglycemia, and foot care basics.   Diabetes Blitz:  -Group instruction provided by PowerPoint slides, verbal discussion, and written materials to  support subject matter. The instructor gives an explanation and review of the physiology behind type 1 and type 2 diabetes, diabetes medications and rational behind using different medications, pre- and post-prandial blood glucose recommendations and Hemoglobin A1c goals, diabetes diet, and exercise including blood glucose guidelines for exercising safely.    Portion Distortion:  -Group instruction provided by PowerPoint slides, verbal discussion, written materials, and food models to support subject matter. The instructor gives an explanation of serving size versus portion size, changes in portions sizes over the last 20 years, and what consists of a serving from each food group.   Stress Management:  -Group instruction provided by verbal instruction, video, and written materials to support subject matter.  Instructors review role of stress in heart disease and how to cope with stress positively.     Exercising on Your Own:  -Group instruction provided by verbal instruction, power point, and written materials to support subject.  Instructors discuss benefits of exercise, components of exercise, frequency and intensity of exercise, and end points for exercise.  Also discuss use of nitroglycerin and activating EMS.  Review options of places to exercise outside of rehab.  Review guidelines for sex with heart disease.   Cardiac Drugs I:  -Group instruction provided by verbal instruction and written materials to support subject.  Instructor reviews cardiac drug classes: antiplatelets, anticoagulants, beta blockers, and statins.  Instructor discusses reasons, side effects, and lifestyle considerations for each drug class.   Cardiac Drugs II:  -Group instruction provided by verbal instruction and written materials to support subject.  Instructor reviews cardiac drug classes: angiotensin converting enzyme inhibitors (ACE-I), angiotensin II receptor blockers (ARBs), nitrates, and calcium channel  blockers.  Instructor discusses reasons, side effects, and lifestyle considerations for each drug class.   Anatomy and Physiology of the Circulatory System:  Group verbal and written instruction  and models provide basic cardiac anatomy and physiology, with the coronary electrical and arterial systems. Review of: AMI, Angina, Valve disease, Heart Failure, Peripheral Artery Disease, Cardiac Arrhythmia, Pacemakers, and the ICD.   Other Education:  -Group or individual verbal, written, or video instructions that support the educational goals of the cardiac rehab program.   Holiday Eating Survival Tips:  -Group instruction provided by PowerPoint slides, verbal discussion, and written materials to support subject matter. The instructor gives patients tips, tricks, and techniques to help them not only survive but enjoy the holidays despite the onslaught of food that accompanies the holidays.   Knowledge Questionnaire Score: Knowledge Questionnaire Score - 04/18/19 1201      Knowledge Questionnaire Score   Pre Score  19/24       Core Components/Risk Factors/Patient Goals at Admission: Personal Goals and Risk Factors at Admission - 04/18/19 1202      Core Components/Risk Factors/Patient Goals on Admission    Weight Management  Yes;Weight Maintenance;Weight Loss;Obesity    Admit Weight  179 lb 14.3 oz (81.6 kg)    Diabetes  Yes    Intervention  Provide education about signs/symptoms and action to take for hypo/hyperglycemia.;Provide education about proper nutrition, including hydration, and aerobic/resistive exercise prescription along with prescribed medications to achieve blood glucose in normal ranges: Fasting glucose 65-99 mg/dL    Expected Outcomes  Short Term: Participant verbalizes understanding of the signs/symptoms and immediate care of hyper/hypoglycemia, proper foot care and importance of medication, aerobic/resistive exercise and nutrition plan for blood glucose control.;Long Term:  Attainment of HbA1C < 7%.    Hypertension  Yes    Intervention  Provide education on lifestyle modifcations including regular physical activity/exercise, weight management, moderate sodium restriction and increased consumption of fresh fruit, vegetables, and low fat dairy, alcohol moderation, and smoking cessation.;Monitor prescription use compliance.    Expected Outcomes  Short Term: Continued assessment and intervention until BP is < 140/45mm HG in hypertensive participants. < 130/53mm HG in hypertensive participants with diabetes, heart failure or chronic kidney disease.;Long Term: Maintenance of blood pressure at goal levels.    Lipids  Yes    Intervention  Provide education and support for participant on nutrition & aerobic/resistive exercise along with prescribed medications to achieve LDL 70mg , HDL >40mg .    Expected Outcomes  Short Term: Participant states understanding of desired cholesterol values and is compliant with medications prescribed. Participant is following exercise prescription and nutrition guidelines.;Long Term: Cholesterol controlled with medications as prescribed, with individualized exercise RX and with personalized nutrition plan. Value goals: LDL < 70mg , HDL > 40 mg.       Core Components/Risk Factors/Patient Goals Review:  Goals and Risk Factor Review    Row Name 04/22/19 1340 05/22/19 0707           Core Components/Risk Factors/Patient Goals Review   Personal Goals Review  Hypertension;Stress;Weight Management/Obesity;Lipids  Hypertension;Stress;Weight Management/Obesity;Lipids      Review  Pt willing to participate in CR exercise.  Bill would like to lose weight and maintain a healthy lifestyle.  30 Day ITP Review.  Pt is currently admitted with chest and left arm pain.  Will follow along to see what the pt's workup reveals and when appropriate to return to CR.  Pt was previously tolerating exercise well.      Expected Outcomes  [name] will continue to participate in  CR exercise, nutrition, and lifestyle modification opportunities.  [name] will continue to participate in CR exercise, nutrition, and lifestyle modification opportunities.  Core Components/Risk Factors/Patient Goals at Discharge (Final Review):  Goals and Risk Factor Review - 05/22/19 0707      Core Components/Risk Factors/Patient Goals Review   Personal Goals Review  Hypertension;Stress;Weight Management/Obesity;Lipids    Review  30 Day ITP Review.  Pt is currently admitted with chest and left arm pain.  Will follow along to see what the pt's workup reveals and when appropriate to return to CR.  Pt was previously tolerating exercise well.    Expected Outcomes  [name] will continue to participate in CR exercise, nutrition, and lifestyle modification opportunities.       ITP Comments: ITP Comments    Row Name 04/18/19 1011 04/22/19 1325 05/22/19 0705       ITP Comments  Dr. Fransico Him, Medical Director  30 Day ITP Review. Bill started exercise today and tolerated it well.  Initially at the end of the session, his BP was low but after drinking water his BP returned to normal.  30 Day ITP Review.  Pt is currently admitted with chest and left arm pain.  Will follow along to see what the pt's workup reveals and when appropriate to return to CR.  Pt was previously tolerating exercise well.        Comments: See ITP Comments.

## 2019-05-23 NOTE — Telephone Encounter (Signed)
PC to patient regarding plan for returning CR.  Informed pt that he will have to wait at least a week d/t his incision from the cath.  Pt has appointment with Terrence Dupont, MD on 05/27/2019.  Pt will keep CR informed about this visit.

## 2019-05-23 NOTE — Discharge Summary (Signed)
NAME: Jeffery Carpenter, Jeffery Carpenter MEDICAL RECORD [id number] ACCOUNT [id number] DATE OF BIRTH:05-17-1941 FACILITY: [facility] LOCATION: [facility] PHYSICIAN:Jennelle Pinkstaff Daivd Council, MD  DISCHARGE SUMMARY  DATE OF DISCHARGE:  05/22/2019  ADMITTING DIAGNOSES: 1.  Unstable angina, rule out myocardial infarction. 2.  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 3.  Hypertension. 4.  Diabetes mellitus. 5.  Hyperlipidemia. 6.  Chronic kidney disease, stage II. 7.  Degenerative joint disease. 8.  History of hemochromatosis. 9.  History of remote tobacco abuse.  FINAL DIAGNOSES: 1.  Stable angina, myocardial infarction ruled out.  Positive nuclear stress test, status post left cardiac catheterization, noted to have patent left circumflex/obtuse marginal stents with mild to moderate right coronary artery stenosis. 2.  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 3.  Hypertension. 4.  Diabetes mellitus. 5.  Hyperlipidemia. 6.  Chronic kidney disease, stage II. 7.  Degenerative joint disease. 8.  History of hemochromatosis. 9.  History of remote tobacco abuse.  DISCHARGE HOME MEDICATIONS:  The patient has been advised to continue home medications, i.e.: 1.  Tylenol 500 mg every 6 hours as needed. 2.  Amlodipine 5 mg 1 tablet daily. 3.  Aspirin 81 mg daily, 4.  Clonazepam 0.5 mg twice daily as needed. 5.  Clopidogrel 75 mg daily. 6.  Benadryl 25 mg as needed for itching. 7.  EpiPen 0.3 mg per 0.3 mL for anaphylactic reaction as before. 8.  Folic acid 1 mg daily. 9.  Nitrostat 0.4 mg sublingual p.r.n. 10.  Thiamine 50 mg 1 tablet daily. 11.  Kenalog cream, apply locally as needed. 12.  Atorvastatin 40 mg daily. 13.  Metformin 500 mg twice daily starting 09/18. 14.  Metoprolol  tartrate 25 mg twice daily.  DIET:  Low-salt, low-cholesterol, 1800-calorie ADA diet.  Post-cardiac catheterization instructions have been given.  Follow up with me in 1 week.  DISCHARGE INSTRUCTIONS:  The patient will restart his cardiac rehab 1 week after seeing me.  The patient has been advised to avoid taking NSAIDs.  CONDITION AT DISCHARGE:  Stable.  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  He came to the ER by EMS complaining of precordial chest pain which woke him up around 3:30  a.m., radiating to left arm and neck, associated with nausea.  He took 3 sublingual nitro and aspirin without much relief, so decided to call EMS.  States had similar chest pain but less severe on Saturday night.  He took 2 sublingual nitro with relief.   Went to his daughter's house yesterday about approximately 3/4 of a mile without any problems.  Has been going to rehab regularly without any problems.  Denies any shortness of breath.  Denies palpitation, lightheadedness, or syncope.  EKG done in the ED showed no acute ischemic changes.  Two sets of high-sensitivity troponin I's have been negative.  PHYSICAL EXAMINATION: VITAL SIGNS:  Blood pressure was 116/104, pulse 72.  He was afebrile. HEENT:  Conjunctivae are pink. NECK:  Supple, no JVD, no bruit. LUNGS:  Clear to auscultation without rhonchi or rales. CARDIOVASCULAR:  S1, S2 was normal.  There was a soft systolic murmur.  No S3 gallop. ABDOMEN:  Soft.  Bowel sounds are present, nontender. EXTREMITIES:  There is no clubbing, cyanosis, or edema. NEUROLOGIC:  Grossly intact.  LABORATORY  DATA:  Sodium was 140, potassium was 4.6, glucose 156, BUN 30, creatinine 1.43.  Hemoglobin 13.1, hematocrit 38.3, white count of 7.3.  Troponin I high sensitivity was 7.  Repeat troponin I was 9 and 8.  Last electrolytes:  Sodium 142,  potassium 3.9, BUN 25, creatinine 1.39.  Hemoglobin 12.1, hematocrit 34, white count 7.4.  IMAGING:  His Lexiscan Myoview showed moderate-sized region of mild inducible ischemia in the inferior wall on the stress images compared to the rest with atelectasis and scarring at the inferior base, moderate septal hypokinesia.  Left ventricular  ejection fraction 54%.  Noninvasive risk stratification intermediate.  BRIEF HOSPITAL COURSE:  The patient was admitted to telemetry unit.  MI was ruled out by serial enzymes and EKG.  The patient subsequently underwent Lexiscan Myoview which showed as above and subsequently required a left cardiac catheterization as per  procedure report. The patient was noted to have widely patent left circumflex/obtuse marginal stents.  RCA has mild mid stenosis and moderate distal stenosis with TIMI grade III distal flow.  The patient did not have any anginal chest pain during the hospital stay except  vague left-sided localized pain and probably musculoskeletal left arm pain.  The patient also complains of neck pain for quite some time.  He will be referred to neurosurgery for possible evaluation for spinal stenosis as outpatient.  The patient's groin  is stable with no evidence of hematoma or bruit.  The patient will be discharged home on above medications and will be followed up in my office in 1 week.  LN/NUANCE D:05/22/2019 T:05/23/2019 JOB:[id number]

## 2019-05-24 ENCOUNTER — Encounter (HOSPITAL_COMMUNITY): Payer: Medicare Other

## 2019-05-27 ENCOUNTER — Encounter (HOSPITAL_COMMUNITY): Payer: Medicare Other

## 2019-05-27 ENCOUNTER — Telehealth (HOSPITAL_COMMUNITY): Payer: Self-pay | Admitting: Family Medicine

## 2019-05-27 ENCOUNTER — Ambulatory Visit (HOSPITAL_COMMUNITY): Payer: Medicare Other

## 2019-05-28 ENCOUNTER — Telehealth: Payer: Self-pay | Admitting: Neurology

## 2019-05-28 NOTE — Telephone Encounter (Signed)
Called spoke with patient he was informed to go to E.D.  Pt refused to go E.D. he states that he was seen in hospital for 3 days they ran test/ labs its not his heart. Pt was informed that his is out of Dr. Carles Collet expertise and to contact Cardiologist if he refused to go to E.D. he said they told him to call her. He also was informed to contact his PCP office. He states that he called them and they have not called him back.  Pt was advised several times to go back to E.D. he refused.   Please be aware

## 2019-05-28 NOTE — Telephone Encounter (Signed)
NOTED

## 2019-05-28 NOTE — Telephone Encounter (Signed)
I agree with your recommendations. I reviewed hospital records and cardiology wrote as follows:  The patient did not have any anginal chest pain during the hospital stay except  vague left-sided localized pain and probably musculoskeletal left arm pain.  The patient also complains of neck pain for quite some time.  He will be referred to neurosurgery for possible evaluation for spinal stenosis as outpatient.  I don't treat neck pain but the referral, per cardiology notes, was to be to neurosurgery.

## 2019-05-28 NOTE — Telephone Encounter (Signed)
Patient called and said his cardiologist, Dr. Terrence Dupont, recommended he call and give an update on his condition so Dr. Carles Collet can be involved in his continued care.  Patient reports he had a heart attack and is continuing to have chest pain and tightness in his left arm. He'd like some recommendations.

## 2019-05-29 ENCOUNTER — Encounter (HOSPITAL_COMMUNITY): Payer: Medicare Other

## 2019-05-31 ENCOUNTER — Other Ambulatory Visit: Payer: Self-pay

## 2019-05-31 ENCOUNTER — Encounter (HOSPITAL_COMMUNITY)
Admission: RE | Admit: 2019-05-31 | Discharge: 2019-05-31 | Disposition: A | Discharge status: Home / Self Care | Payer: Medicare Other | Source: Ambulatory Visit | Attending: Cardiology | Admitting: Cardiology

## 2019-05-31 DIAGNOSIS — Z955 Presence of coronary angioplasty implant and graft: Secondary | ICD-10-CM | POA: Diagnosis present

## 2019-05-31 DIAGNOSIS — I214 Non-ST elevation (NSTEMI) myocardial infarction: Secondary | ICD-10-CM

## 2019-06-03 ENCOUNTER — Other Ambulatory Visit: Payer: Self-pay

## 2019-06-03 ENCOUNTER — Encounter (HOSPITAL_COMMUNITY)
Admission: RE | Admit: 2019-06-03 | Discharge: 2019-06-03 | Disposition: A | Discharge status: Home / Self Care | Payer: Medicare Other | Source: Ambulatory Visit | Attending: Cardiology | Admitting: Cardiology

## 2019-06-03 DIAGNOSIS — Z955 Presence of coronary angioplasty implant and graft: Secondary | ICD-10-CM

## 2019-06-03 DIAGNOSIS — I214 Non-ST elevation (NSTEMI) myocardial infarction: Secondary | ICD-10-CM

## 2019-06-05 ENCOUNTER — Other Ambulatory Visit: Payer: Self-pay

## 2019-06-05 ENCOUNTER — Encounter (HOSPITAL_COMMUNITY)
Admission: RE | Admit: 2019-06-05 | Discharge: 2019-06-05 | Disposition: A | Discharge status: Home / Self Care | Payer: Medicare Other | Source: Ambulatory Visit | Attending: Cardiology | Admitting: Cardiology

## 2019-06-05 DIAGNOSIS — I214 Non-ST elevation (NSTEMI) myocardial infarction: Secondary | ICD-10-CM | POA: Diagnosis not present

## 2019-06-05 DIAGNOSIS — Z955 Presence of coronary angioplasty implant and graft: Secondary | ICD-10-CM

## 2019-06-07 ENCOUNTER — Other Ambulatory Visit: Payer: Self-pay

## 2019-06-07 ENCOUNTER — Encounter (HOSPITAL_COMMUNITY)
Admission: RE | Admit: 2019-06-07 | Discharge: 2019-06-07 | Disposition: A | Discharge status: Home / Self Care | Payer: Medicare Other | Source: Ambulatory Visit | Attending: Cardiology | Admitting: Cardiology

## 2019-06-07 VITALS — Ht 67.5 in | Wt 176.8 lb

## 2019-06-07 DIAGNOSIS — I214 Non-ST elevation (NSTEMI) myocardial infarction: Secondary | ICD-10-CM | POA: Diagnosis present

## 2019-06-07 DIAGNOSIS — Z955 Presence of coronary angioplasty implant and graft: Secondary | ICD-10-CM | POA: Diagnosis present

## 2019-06-10 ENCOUNTER — Encounter (HOSPITAL_COMMUNITY): Payer: Medicare Other

## 2019-06-10 NOTE — Telephone Encounter (Signed)
Sending to clinical staff for review: Okay to sign/close encounter or is further follow up needed? ° °

## 2019-06-12 ENCOUNTER — Encounter (HOSPITAL_COMMUNITY): Payer: Medicare Other

## 2019-06-13 NOTE — Progress Notes (Signed)
Discharge Progress Report  Patient Details  Name: Jeffery Carpenter MRN: [id number] Date of Birth: 09/02/1941 Referring Provider:     CARDIAC REHAB PHASE II ORIENTATION from 04/18/2019 in [facility]  Referring Provider  Dr. Terrence Dupont       Number of Visits: 15  Reason for Discharge:  Patient reached a stable level of exercise. Patient independent in their exercise. Patient has met program and personal goals.  Smoking History:  Social History   Tobacco Use  Smoking Status Former Smoker  . Quit date: 02/07/1960  . Years since quitting: 58.3  Smokeless Tobacco Former User    Diagnosis:  NSTEMI (non-ST elevated myocardial infarction) ([place])  Status post coronary artery stent placement  ADL [facility]:   Initial Exercise Prescription: Initial Exercise Prescription - 04/18/19 1100      Date of Initial Exercise RX and Referring Provider   Date  04/18/19    Referring Provider  Dr. Terrence Dupont    Expected Discharge Date  05/31/19      NuStep   Level  2    SPM  75    Minutes  15    METs  1.5      Arm Ergometer   Level  1.5    Watts  20    Minutes  15    METs  1.6      Prescription Details   Frequency (times per week)  2    Duration  Progress to 30 minutes of continuous aerobic without signs/symptoms of physical distress      Intensity   THRR 40-80% of Max Heartrate  56-113    Ratings of Perceived Exertion  11-13      Progression   Progression  Continue to progress workloads to maintain intensity without signs/symptoms of physical distress.      Resistance Training   Training Prescription  Yes    Weight  4 lbs.     Reps  10-15       Discharge Exercise Prescription (Final Exercise Prescription Changes): Exercise Prescription Changes - 06/07/19 1106      Response to Exercise   Blood Pressure (Admit)  112/62    Blood Pressure (Exercise)  112/70    Blood Pressure (Exit)  104/70    Heart Rate (Admit)  77 bpm    Heart Rate (Exercise)   117 bpm    Heart Rate (Exit)  85 bpm    Rating of Perceived Exertion (Exercise)  11    Symptoms  none    Duration  Continue with 30 min of aerobic exercise without signs/symptoms of physical distress.    Intensity  THRR unchanged      Progression   Progression  Continue to progress workloads to maintain intensity without signs/symptoms of physical distress.    Average METs  3.4      Resistance Training   Training Prescription  Yes    Weight  4lbs    Reps  10-15    Time  10 Minutes      Interval Training   Interval Training  No      Recumbant Bike   Level  3    Watts  36    Minutes  15    METs  3.41      NuStep   Level  4    SPM  85    Minutes  15    METs  3.4      Home Exercise Plan   Plans  to continue exercise at  Home (comment)   Walking   Frequency  Add 4 additional days to program exercise sessions.    Initial Home Exercises Provided  05/15/19       Functional Capacity: 6 Minute Walk    Row Name 04/18/19 1157 06/05/19 1104       6 Minute Walk   Phase  Initial  Discharge    Distance  1000 feet  1251 feet    Distance % Change  -  25.1 %    Distance Feet Change  -  251 ft    Walk Time  6 minutes  6 minutes    # of Rest Breaks  0  0    MPH  1.89  2.37    METS  1.65  2.38    RPE  11  11    Perceived Dyspnea   0  0    VO2 Peak  5.8  8.34    Symptoms  No  No    Resting HR  71 bpm  93 bpm    Resting BP  104/70  100/74    Resting Oxygen Saturation   99 %  -    Exercise Oxygen Saturation  during 6 min walk  99 %  -    Max Ex. HR  92 bpm  108 bpm    Max Ex. BP  110/68  124/76    2 Minute Post BP  102/68  100/62       Psychological, QOL, Others - Outcomes: PHQ 2/9: Depression screen [facility] 2/9 06/07/2019 04/22/2019 04/30/2018 04/24/2017 02/29/2016  Decreased Interest 0 0 0 0 0  Down, Depressed, Hopeless 0 0 0 0 0  PHQ - 2 Score 0 0 0 0 0    Quality of Life: Quality of Life - 06/05/19 1424      Quality of Life   Select  Quality of Life      Quality of  Life Scores   Health/Function Pre  22.68 %    Health/Function Post  22.07 %    Health/Function % Change  -2.69 %    Socioeconomic Pre  27 %    Socioeconomic Post  24.08 %    Socioeconomic % Change   -10.81 %    Psych/Spiritual Pre  23.79 %    Psych/Spiritual Post  23.75 %    Psych/Spiritual % Change  -0.17 %    Family Pre  25.7 %    Family Post  25.9 %    Family % Change  0.78 %    GLOBAL Pre  24.11 %    GLOBAL Post  23.36 %    GLOBAL % Change  -3.11 %       Personal Goals: Goals established at orientation with interventions provided to work toward goal. Personal Goals and Risk Factors at Admission - 04/18/19 1202      Core Components/Risk Factors/Patient Goals on Admission    Weight Management  Yes;Weight Maintenance;Weight Loss;Obesity    Admit Weight  179 lb 14.3 oz (81.6 kg)    Diabetes  Yes    Intervention  Provide education about signs/symptoms and action to take for hypo/hyperglycemia.;Provide education about proper nutrition, including hydration, and aerobic/resistive exercise prescription along with prescribed medications to achieve blood glucose in normal ranges: Fasting glucose 65-99 mg/dL    Expected Outcomes  Short Term: Participant verbalizes understanding of the signs/symptoms and immediate care of hyper/hypoglycemia, proper foot care and importance of medication, aerobic/resistive exercise and  nutrition plan for blood glucose control.;Long Term: Attainment of HbA1C < 7%.    Hypertension  Yes    Intervention  Provide education on lifestyle modifcations including regular physical activity/exercise, weight management, moderate sodium restriction and increased consumption of fresh fruit, vegetables, and low fat dairy, alcohol moderation, and smoking cessation.;Monitor prescription use compliance.    Expected Outcomes  Short Term: Continued assessment and intervention until BP is < 140/74m HG in hypertensive participants. < 130/850mHG in hypertensive participants with  diabetes, heart failure or chronic kidney disease.;Long Term: Maintenance of blood pressure at goal levels.    Lipids  Yes    Intervention  Provide education and support for participant on nutrition & aerobic/resistive exercise along with prescribed medications to achieve LDL <7065mHDL >73m91m  Expected Outcomes  Short Term: Participant states understanding of desired cholesterol values and is compliant with medications prescribed. Participant is following exercise prescription and nutrition guidelines.;Long Term: Cholesterol controlled with medications as prescribed, with individualized exercise RX and with personalized nutrition plan. Value goals: LDL < 70mg78mL > 40 mg.        Personal Goals Discharge: Goals and Risk Factor Review    Row Name 04/22/19 1340 05/22/19 0707 06/07/19 1555         Core Components/Risk Factors/Patient Goals Review   Personal Goals Review  Hypertension;Stress;Weight Management/Obesity;Lipids  Hypertension;Stress;Weight Management/Obesity;Lipids  Hypertension;Stress;Weight Management/Obesity;Lipids     Review  Pt willing to participate in CR exercise.  Bill would like to lose weight and maintain a healthy lifestyle.  30 Day ITP Review.  Pt is currently admitted with chest and left arm pain.  Will follow along to see what the pt's workup reveals and when appropriate to return to CR.  Pt was previously tolerating exercise well.  Bill graduated [facility] [employer] with [facility] complete sessions.  He enjoyed participated in CR and increased his strength.  His post 6MWT improved from his pre 6MWT.     Expected Outcomes  Bill [name] continue to participate in CR exercise, nutrition, and lifestyle modification opportunities.  Bill [name] continue to participate in CR exercise, nutrition, and lifestyle modification opportunities.  Bill [name] continue to participate in exercise, nutrition, and lifestyle modification opportunities.  He plans to exercise at the gym.        Exercise Goals and  Review: Exercise Goals    Row Name 04/18/19 1200             Exercise Goals   Increase Physical Activity  Yes       Intervention  Provide advice, education, support and counseling about physical activity/exercise needs.;Develop an individualized exercise prescription for aerobic and resistive training based on initial evaluation findings, risk stratification, comorbidities and participant's personal goals.       Expected Outcomes  Short Term: Attend rehab on a regular basis to increase amount of physical activity.;Long Term: Add in home exercise to make exercise part of routine and to increase amount of physical activity.;Long Term: Exercising regularly at least 3-5 days a week.       Increase Strength and Stamina  Yes       Intervention  Provide advice, education, support and counseling about physical activity/exercise needs.;Develop an individualized exercise prescription for aerobic and resistive training based on initial evaluation findings, risk stratification, comorbidities and participant's personal goals.       Expected Outcomes  Short Term: Increase workloads from initial exercise prescription for resistance, speed, and METs.;Short Term: Perform resistance training exercises routinely during  rehab and add in resistance training at home;Long Term: Improve cardiorespiratory fitness, muscular endurance and strength as measured by increased METs and functional capacity (6MWT)       Able to understand and use rate of perceived exertion (RPE) scale  Yes       Intervention  Provide education and explanation on how to use RPE scale       Expected Outcomes  Short Term: Able to use RPE daily in rehab to express subjective intensity level;Long Term:  Able to use RPE to guide intensity level when exercising independently       Knowledge and understanding of Target Heart Rate Range (THRR)  Yes       Intervention  Provide education and explanation of THRR including how the numbers were predicted and  where they are located for reference       Expected Outcomes  Short Term: Able to state/look up THRR;Long Term: Able to use THRR to govern intensity when exercising independently;Short Term: Able to use daily as guideline for intensity in rehab       Able to check pulse independently  Yes       Intervention  Provide education and demonstration on how to check pulse in carotid and radial arteries.;Review the importance of being able to check your own pulse for safety during independent exercise       Expected Outcomes  Short Term: Able to explain why pulse checking is important during independent exercise;Long Term: Able to check pulse independently and accurately       Understanding of Exercise Prescription  Yes       Intervention  Provide education, explanation, and written materials on patient's individual exercise prescription       Expected Outcomes  Short Term: Able to explain program exercise prescription;Long Term: Able to explain home exercise prescription to exercise independently          Exercise Goals Re-Evaluation: Exercise Goals Re-Evaluation    Row Name 04/22/19 1209 05/15/19 1220 06/05/19 1111 06/11/19 1647       Exercise Goal Re-Evaluation   Exercise Goals Review  Increase Physical Activity;Able to understand and use rate of perceived exertion (RPE) scale  Increase Physical Activity;Able to understand and use rate of perceived exertion (RPE) scale;Knowledge and understanding of Target Heart Rate Range (THRR);Understanding of Exercise Prescription;Able to check pulse independently;Increase Strength and Stamina  Increase Physical Activity;Able to understand and use rate of perceived exertion (RPE) scale;Knowledge and understanding of Target Heart Rate Range (THRR);Understanding of Exercise Prescription;Able to check pulse independently;Increase Strength and Stamina  Increase Physical Activity;Able to understand and use rate of perceived exertion (RPE) scale;Knowledge and understanding  of Target Heart Rate Range (THRR);Understanding of Exercise Prescription;Able to check pulse independently;Increase Strength and Stamina    Comments  Patient able to understand use RPE scale appropriately.  Reviewed home exercise guidelines with patient including THRR, RPE scale and endpoints for exercise. Pt is able to check his heart rate using cuff at home. Pt is walking 0.5 miles daily and has 2lb, 3lb, and 5lb hand weights that he uses for his resistance training.  Patient will complete the cardiac rehab program on 06/07/2019 and has done well achieving 3.3 METs with exercise. Pt is walking 1 mile/day and also plans to return to exercise at [employer]. Pt was previously exercising at the gym with a trainer 3 days/week. Patient has lost 2.2 kg since orientation. Pt's functional capacity increased 25% as measured by 6MWT and strength increased 4% as  measured by grip strength test.  Patient completed the phase 2 cardiac rehab program and will continue exercise at [employer] at this time.    Expected Outcomes  Increase workloads as tolerated to help achieve personal health and fitness goals.  Patient will progress workloads at cardiac rehab to help improve strength and stamina.  Patient will exercise 3-7 days/week at [employer] or walking to maintain health and fitness gains.  Patient will exercise 3-7 days/week at [employer] or walking to maintain health and fitness gains.       Nutrition & Weight - Outcomes: Pre Biometrics - 04/18/19 1200      Pre Biometrics   Height  5' 7.5" (1.715 m)    Weight  81.6 kg    Waist Circumference  40 inches    Hip Circumference  40.5 inches    Waist to Hip Ratio  0.99 %    BMI (Calculated)  27.74    Triceps Skinfold  25 mm    % Body Fat  30.1 %    Grip Strength  35 kg    Flexibility  14 in    Single Leg Stand  3.3 seconds      Post Biometrics - 06/07/19 1106       Post  Biometrics   Height  5' 7.5" (1.715 m)    Weight  80.2 kg    Waist  Circumference  38.5 inches    Hip Circumference  36.75 inches    Waist to Hip Ratio  1.05 %    BMI (Calculated)  27.27    Triceps Skinfold  20 mm    % Body Fat  28.3 %    Grip Strength  36.5 kg    Flexibility  12 in    Single Leg Stand  30 seconds       Nutrition:   Nutrition Discharge:   Education Questionnaire Score: Knowledge Questionnaire Score - 06/05/19 1425      Knowledge Questionnaire Score   Pre Score  19/24    Post Score  19/28       Goals reviewed with patient; copy given to patient.

## 2019-06-14 ENCOUNTER — Encounter (HOSPITAL_COMMUNITY): Payer: Medicare Other

## 2019-09-24 ENCOUNTER — Other Ambulatory Visit: Payer: Self-pay | Admitting: Family Medicine

## 2019-09-24 DIAGNOSIS — N63 Unspecified lump in unspecified breast: Secondary | ICD-10-CM

## 2019-10-07 ENCOUNTER — Other Ambulatory Visit: Payer: Self-pay

## 2019-10-07 ENCOUNTER — Ambulatory Visit
Admission: RE | Admit: 2019-10-07 | Discharge: 2019-10-07 | Disposition: A | Discharge status: Home / Self Care | Payer: Medicare Other | Source: Ambulatory Visit | Attending: Family Medicine | Admitting: Family Medicine

## 2019-10-07 DIAGNOSIS — N63 Unspecified lump in unspecified breast: Secondary | ICD-10-CM

## 2019-10-25 ENCOUNTER — Ambulatory Visit: Payer: Medicare Other

## 2019-10-27 ENCOUNTER — Ambulatory Visit: Payer: Medicare Other | Attending: Internal Medicine

## 2019-10-27 DIAGNOSIS — Z23 Encounter for immunization: Secondary | ICD-10-CM | POA: Insufficient documentation

## 2019-10-27 NOTE — Progress Notes (Signed)
   Covid-19 Vaccination Clinic  Name:  Jeffery Carpenter    MRN: [id number] DOB: 1940-10-17  10/27/2019  Mr. Merkel was observed post Covid-19 immunization for 15 minutes without incidence. He was provided with Vaccine Information Sheet and instruction to access the V-Safe system.   Mr. Brisk was instructed to call 911 with any severe reactions post vaccine: Marland Kitchen Difficulty breathing  . Swelling of your face and throat  . A fast heartbeat  . A bad rash all over your body  . Dizziness and weakness    Immunizations Administered    Name Date Dose VIS Date Route   Pfizer COVID-19 Vaccine 10/27/2019  9:33 AM 0.3 mL 08/16/2019 Intramuscular   Manufacturer: [place]   Lot: [id number]   [place]: [id number]

## 2019-11-20 ENCOUNTER — Ambulatory Visit: Payer: Medicare Other | Attending: Internal Medicine

## 2019-11-20 DIAGNOSIS — Z23 Encounter for immunization: Secondary | ICD-10-CM

## 2019-11-20 NOTE — Progress Notes (Signed)
   Covid-19 Vaccination Clinic  Name:  Jeffery Carpenter    MRN: [id number] DOB: December 09, 1940  11/20/2019  Mr. Jeffery Carpenter was observed post Covid-19 immunization for 15 minutes without incident. He was provided with Vaccine Information Sheet and instruction to access the V-Safe system.   Mr. Jeffery Carpenter was instructed to call 911 with any severe reactions post vaccine: Marland Kitchen Difficulty breathing  . Swelling of face and throat  . A fast heartbeat  . A bad rash all over body  . Dizziness and weakness   Immunizations Administered    Name Date Dose VIS Date Route   Pfizer COVID-19 Vaccine 11/20/2019  9:33 AM 0.3 mL 08/16/2019 Intramuscular   Manufacturer: [place]   Lot: [id number]   [place]: [id number]

## 2020-01-01 ENCOUNTER — Telehealth: Payer: Self-pay | Admitting: *Deleted

## 2020-01-01 NOTE — Telephone Encounter (Signed)
Patient called - left VM. Cannot come to appts on Friday [phone] due to conflict. Asked to reschedule appts. Schedule message sent to contact patient to reschedule. Contacted patient - no answer - LVM that appointments for 4/30 cancelled and that he would be contacted by scheduling. Advised that hif he did not hear from scheduling by first of next week to contact office.

## 2020-01-02 ENCOUNTER — Telehealth: Payer: Self-pay | Admitting: Hematology

## 2020-01-02 NOTE — Telephone Encounter (Signed)
Called pt per 4/29 sch message - no answer . Left message for pt to call back to reschedule cancelled appt

## 2020-01-03 ENCOUNTER — Inpatient Hospital Stay: Payer: Medicare Other | Admitting: Hematology

## 2020-01-03 ENCOUNTER — Telehealth: Payer: Self-pay

## 2020-01-03 ENCOUNTER — Inpatient Hospital Stay: Payer: Medicare Other

## 2020-01-03 NOTE — Telephone Encounter (Signed)
TCT patient regarding his VM on rescheduling his appt. but no response. Pt. Stated in VM that he would be on vacation next week.  LVM advising pt. to call office back when he returns from his vacation to reschedule his appt. Added that he can call back with any questions or concerns.

## 2020-03-05 ENCOUNTER — Other Ambulatory Visit: Payer: Self-pay | Admitting: Hematology

## 2020-03-05 NOTE — Telephone Encounter (Signed)
Please review for refill. Patient last appointment 01/03/20. No future appointments scheduled

## 2020-04-04 ENCOUNTER — Other Ambulatory Visit: Payer: Self-pay | Admitting: Hematology

## 2020-05-02 ENCOUNTER — Other Ambulatory Visit: Payer: Self-pay | Admitting: Hematology

## 2020-05-05 ENCOUNTER — Telehealth: Payer: Self-pay | Admitting: Hematology

## 2020-05-05 NOTE — Telephone Encounter (Signed)
Called pt per 8/30 sch msg - left message for patient to call back to reschedule appt

## 2020-05-31 IMAGING — DX DG CHEST 1V PORT
1 series · 1 of 1 positions shown · non-contrast
Comparison: Radiograph March 23, 2019.

CLINICAL DATA: Chest pain.

EXAM:
PORTABLE CHEST 1 VIEW

[chest]
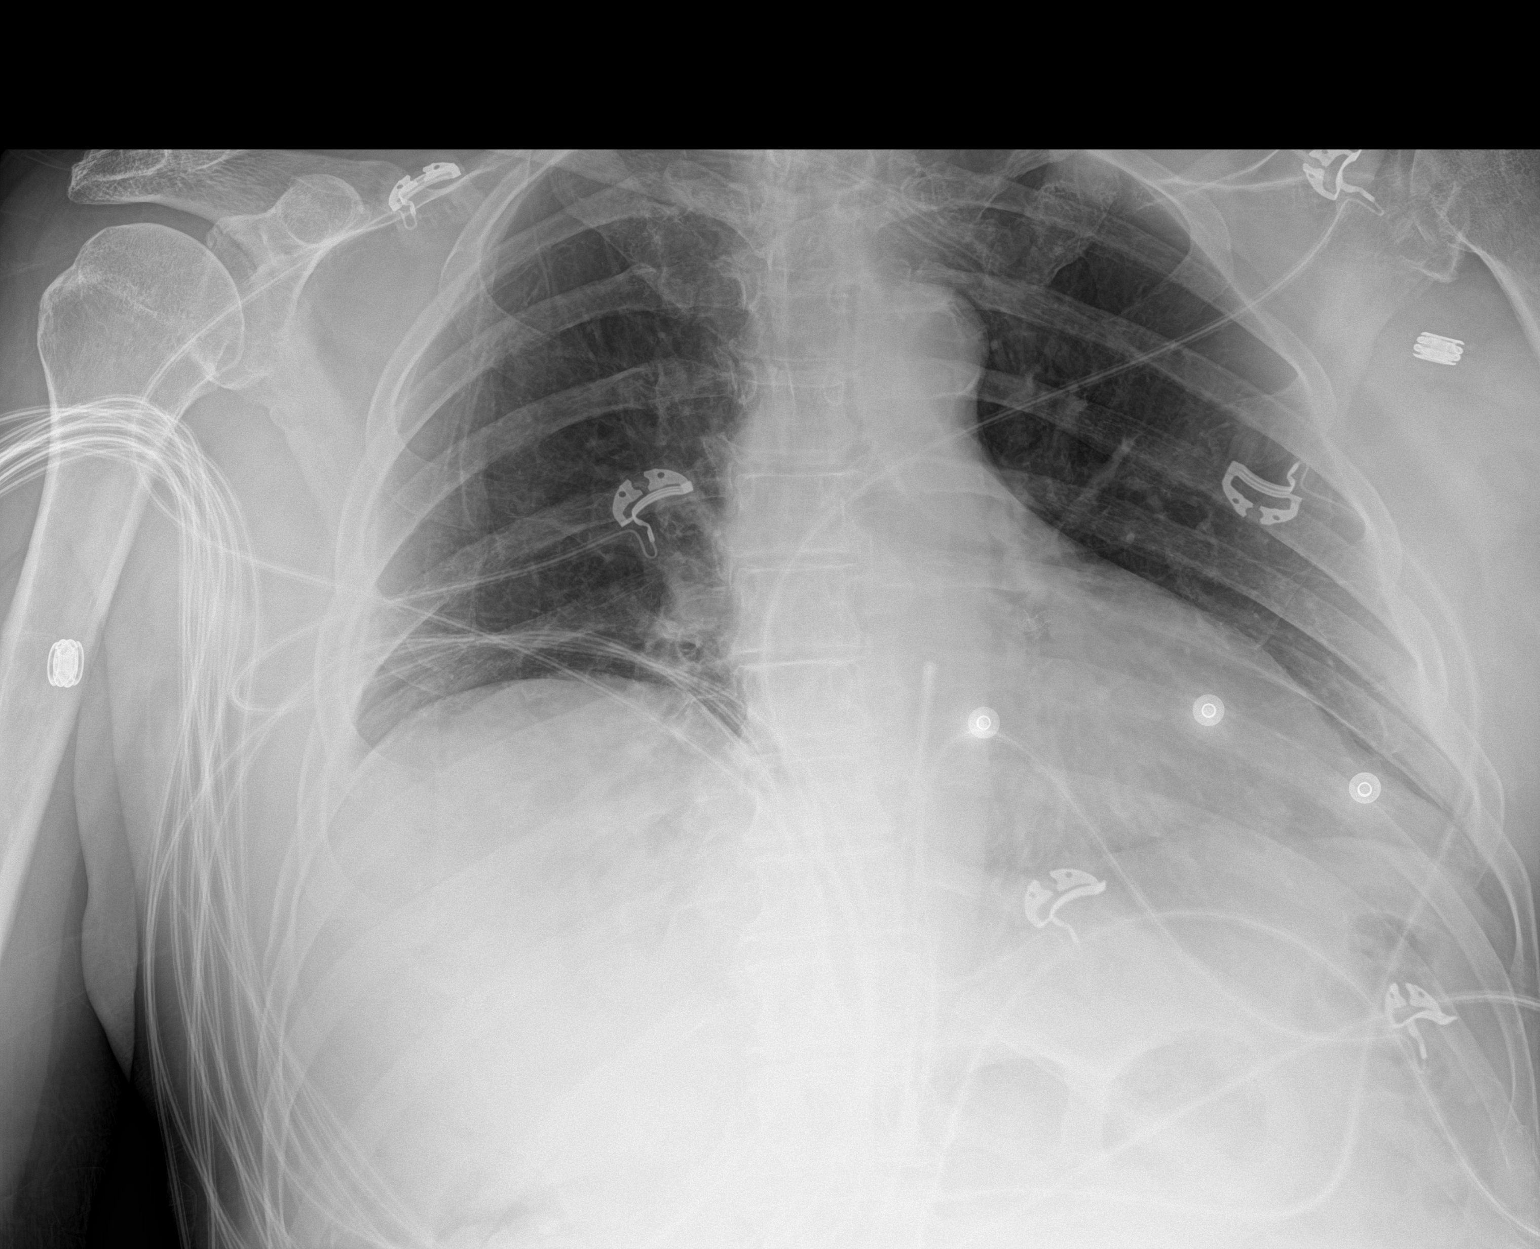

[1 of 1 positions shown; findings below may reference images not displayed]

FINDINGS: Stable cardiomediastinal silhouette. Atherosclerosis of thoracic
aorta is noted. No pneumothorax or pleural effusion is noted. Both
lungs are clear. The visualized skeletal structures are
unremarkable.
IMPRESSION: No active disease.

Aortic Atherosclerosis ([id number]).

## 2020-06-02 ENCOUNTER — Telehealth: Payer: Self-pay | Admitting: Hematology

## 2020-06-02 NOTE — Telephone Encounter (Signed)
Rescheduled patient's appointments from 4/30 per patient request. Gave patient updated calendar.

## 2020-06-05 ENCOUNTER — Other Ambulatory Visit: Payer: Self-pay | Admitting: Hematology

## 2020-06-05 NOTE — Telephone Encounter (Signed)
Patient request refill

## 2020-06-08 ENCOUNTER — Other Ambulatory Visit: Payer: Self-pay | Admitting: *Deleted

## 2020-06-09 ENCOUNTER — Inpatient Hospital Stay: Payer: Medicare Other

## 2020-06-09 ENCOUNTER — Other Ambulatory Visit: Payer: Self-pay

## 2020-06-09 ENCOUNTER — Inpatient Hospital Stay: Payer: Medicare Other | Attending: Hematology | Admitting: Hematology

## 2020-06-09 DIAGNOSIS — N189 Chronic kidney disease, unspecified: Secondary | ICD-10-CM | POA: Insufficient documentation

## 2020-06-09 DIAGNOSIS — Z87891 Personal history of nicotine dependence: Secondary | ICD-10-CM | POA: Diagnosis not present

## 2020-06-09 LAB — FERRITIN: Ferritin: 41 ng/mL (ref 24–336)

## 2020-06-09 LAB — CMP (CANCER CENTER ONLY)
ALT: 17 U/L (ref 0–44)
AST: 18 U/L (ref 15–41)
Albumin: 3.5 g/dL (ref 3.5–5.0)
Alkaline Phosphatase: 50 U/L (ref 38–126)
Anion gap: 3 — ABNORMAL LOW (ref 5–15)
BUN: 27 mg/dL — ABNORMAL HIGH (ref 8–23)
CO2: 26 mmol/L (ref 22–32)
Calcium: 9 mg/dL (ref 8.9–10.3)
Chloride: 110 mmol/L (ref 98–111)
Creatinine: 1.64 mg/dL — ABNORMAL HIGH (ref 0.61–1.24)
GFR, Estimated: 39 mL/min — ABNORMAL LOW (ref >60)
Glucose, Bld: 166 mg/dL — ABNORMAL HIGH (ref 70–99)
Potassium: 4.4 mmol/L (ref 3.5–5.1)
Sodium: 139 mmol/L (ref 135–145)
Total Bilirubin: 0.7 mg/dL (ref 0.3–1.2)
Total Protein: 6.6 g/dL (ref 6.5–8.1)

## 2020-06-09 LAB — CBC WITH DIFFERENTIAL (CANCER CENTER ONLY)
Abs Immature Granulocytes: 0.02 10*3/uL (ref 0.00–0.07)
Basophils Absolute: 0.1 10*3/uL (ref 0.0–0.1)
Basophils Relative: 1 %
Eosinophils Absolute: 0.3 10*3/uL (ref 0.0–0.5)
Eosinophils Relative: 5 %
HCT: 35.8 % — ABNORMAL LOW (ref 39.0–52.0)
Hemoglobin: 12.4 g/dL — ABNORMAL LOW (ref 13.0–17.0)
Immature Granulocytes: 0 %
Lymphocytes Relative: 28 %
Lymphs Abs: 1.6 10*3/uL (ref 0.7–4.0)
MCH: 33 pg (ref 26.0–34.0)
MCHC: 34.6 g/dL (ref 30.0–36.0)
MCV: 95.2 fL (ref 80.0–100.0)
Monocytes Absolute: 0.5 10*3/uL (ref 0.1–1.0)
Monocytes Relative: 8 %
Neutro Abs: 3.4 10*3/uL (ref 1.7–7.7)
Neutrophils Relative %: 58 %
Platelet Count: 257 10*3/uL (ref 150–400)
RBC: 3.76 MIL/uL — ABNORMAL LOW (ref 4.22–5.81)
RDW: 12.3 % (ref 11.5–15.5)
WBC Count: 5.8 10*3/uL (ref 4.0–10.5)
nRBC: 0 % (ref 0.0–0.2)

## 2020-06-09 LAB — IRON AND TIBC
Iron: 92 ug/dL (ref 42–163)
Saturation Ratios: 43 % (ref 20–55)
TIBC: 213 ug/dL (ref 202–409)
UIBC: 121 ug/dL (ref 117–376)

## 2020-06-09 NOTE — Patient Instructions (Signed)
Thank you for choosing [facility] to provide your oncology and hematology care.   Should you have questions after your visit to the [facility] ([facility]), please contact this office at [phone] between 8:30 AM and 4:30 PM.  Voice mails left after 4:00 PM may not be returned until the following business day.  Calls received after 4:30 PM will be answered by an off-site Nurse Triage Line.    Prescription Refills:  Please have your pharmacy contact us directly for most prescription requests.  Contact the office directly for refills of narcotics (pain medications). Allow 48-72 hours for refills.  Appointments: Please contact the [facility] scheduling department [phone] for questions regarding [facility] appointment scheduling.  Contact the schedulers with any scheduling changes so that your appointment can be rescheduled in a timely manner.   Central Scheduling for [place] [phone] - Call to schedule procedures such as PET scans, CT scans, MRI, Ultrasound, etc.  To afford each patient quality time with our providers, please arrive 30 minutes before your scheduled appointment time.  If you arrive late for your appointment, you may be asked to reschedule.  We strive to give you quality time with our providers, and arriving late affects you and other patients whose appointments are after yours. If you are a no show for multiple scheduled visits, you may be dismissed from the clinic at the providers discretion.     Resources: [facility] Social Workers [phone] for additional information on assistance programs or assistance connecting with community support programs   [place] DSS  [phone]: Information regarding food stamps, Medicaid, and utility assistance [place] [employer] [place] 336-333-6589    [employer]'s shared-ride transportation service for eligible riders who have a disability that prevents them from riding the fixed route bus.   Medicare  Rights Center 800-333-4114 Helps people with Medicare understand their rights and benefits, navigate the Medicare system, and secure the quality healthcare they deserve [employer] 800-227-2345 Assists patients locate various types of support and financial assistance Cancer Care: 1-800-813-HOPE (4673) Provides financial assistance, online support groups, medication/co-pay assistance.   Transportation Assistance for appointments at [facility]: Transportation Coordinator [phone]  Again, thank you for choosing  Cancer Center for your care.       

## 2020-06-09 NOTE — Progress Notes (Signed)
HEMATOLOGY/ONCOLOGY CONSULTATION NOTE  Date of Service: 06/09/2020  Patient Care Team: [name], L.Marlou Sa, MD as PCP - General (Family Medicine)  CHIEF COMPLAINTS/PURPOSE OF CONSULTATION:  Hereditary Hemochromatosis  HISTORY OF PRESENTING ILLNESS:   Jeffery Carpenter is a wonderful 79 y.o. male who has been referred to [place] by Dr. 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 The pt reports that he is doing well overall.   The pt reports that he was first diagnosed with homozygous hereditary hemochromatosis in 2001, pursuing testing after learning that his brother had the homozygous disease, who died of liver cancer. Both of the patient's parents were carriers. The pt notes that his Ferritin was 883 upon initial diagnosis 20 years ago, and prior to his diagnosis he had donated blood a couple times a year. He is not positive that he has the homozygous mutation, and molecular records of his diagnosis are not available. The pt currently donates blood to the [facility] every 2 months or so, and notes his Ferritin remains around 50. The pt notes that he sees his PCP every 6 months with labs, and has been following up with Hematology once a year.   The pt notes that he is "79 years old, can do a lot of things," and remains fairly active and works out. He denies any other significant medical concerns and denies liver or heart problems. He notes that he has some degenerative discs which give him only some discomfort when he bends over.  Most recent lab results (04/16/18) of CBC w/diff and CMP is as follows: all values are WNL except for MCH at 33.3, Glucose at 175, Creatinine at 1.28, GFR at 53, Chloride at 109. 04/16/18 Ferritin at 49  On review of systems, pt reports stable energy levels, staying active, stable weight, eating well, and denies concerns for infections, and any other symptoms.  On PMHx the pt reports Homozygous Hereditary Hemochromatosis, denies liver or  heart problems. Erectile dysfunction. On Family Hx the pt reports brother with homozygous hereditary hemochromatosis. Daughter with carrier state of C282Y mutation.  INTERVAL HISTORY:  Jeffery Carpenter is a wonderful 79 y.o. 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 The patient's last visit with [place] was on 01/03/2019. The pt reports that he is doing well overall.  The pt reports he has had an MI in 03/2019 and has PCI for CAD.  Of note since the patient's last visit, pt has had therapeutic phlebotomies every 3 months or so. Has about 6 months break after his heart attack in 03/2019.  Lab results today (06/09/20) of CBC w/diff and CMP is as follows. 06/09/2020 Ferritin at 41 06/09/2020 Iron Panel - iron saturation of 43%  On review of systems, pt reports no other acute new symptoms.   MEDICAL HISTORY:  Past Medical History:  Diagnosis Date  . 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 Hemochromatosis   . HTN (hypertension), benign 02/06/2014   pt takes inderal for pre competition calmness  . 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 Myocardial infarction ([place])   . Other and unspecified hyperlipidemia 02/06/2014    SURGICAL HISTORY: Past Surgical History:  Procedure Laterality Date  . APPENDECTOMY    . BILATERAL CARPAL TUNNEL RELEASE Bilateral   . CORONARY STENT INTERVENTION N/A 03/25/2019   Procedure: CORONARY STENT INTERVENTION;  Surgeon: Belva Crome, MD;  Location: [place] [facility];  Service: Cardiovascular;  Laterality: N/A;  .  EYE SURGERY     cataract  . HERNIA REPAIR     abdomen  . JOINT REPLACEMENT     lt TKA  . KNEE ARTHROSCOPY Right   . LEFT HEART CATH AND CORONARY ANGIOGRAPHY N/A 03/25/2019   Procedure: LEFT HEART CATH AND CORONARY ANGIOGRAPHY;  Surgeon: [name], MD;  Location: [place] [facility];  Service: Cardiovascular;  Laterality: N/A;  . LEFT HEART CATH AND CORONARY ANGIOGRAPHY N/A 05/22/2019   Procedure: LEFT HEART  CATH AND CORONARY ANGIOGRAPHY;  Surgeon: [name], MD;  Location: [place] [facility];  Service: Cardiovascular;  Laterality: N/A;  . MASS EXCISION Left 09/12/2014   Procedure: LEFT ELBOW AND FOREARM MASS REMOVAL;  Surgeon: Roseanne Kaufman, MD;  Location: [place];  Service: Orthopedics;  Laterality: Left;  . TONSILLECTOMY    . WRIST SURGERY Left     SOCIAL HISTORY: Social History   Socioeconomic History  . Marital status: Married    Spouse name: Not on file  . Number of children: 2  . Years of education: Not on file  . Highest education level: Associate degree: academic program  Occupational History  . Occupation: retired    Comment: Event organiser  Tobacco Use  . Smoking status: Former Smoker    Quit date: 02/07/1960    Years since quitting: 60.3  . Smokeless tobacco: Former Network engineer  . Vaping Use: Never used  Substance and Sexual Activity  . Alcohol use: Yes    Alcohol/week: 0.0 standard drinks    Comment: 1 time a month  . Drug use: No  . Sexual activity: Not Currently  Other Topics Concern  . Not on file  Social History Narrative   Pt is married lives with his spouseEdd Fabian, he has 2 children, he has a associate degree in welding    Pt lives in 1 story home- he is right handed- he does not drink coffee, tea, some soda    Social Determinants of Health   Financial Resource Strain:   . Difficulty of Paying Living Expenses: Not on file  Food Insecurity:   . Worried About Charity fundraiser in the Last Year: Not on file  . Ran Out of Food in the Last Year: Not on file  Transportation Needs:   . Lack of Transportation (Medical): Not on file  . Lack of Transportation (Non-Medical): Not on file  Physical Activity:   . Days of Exercise per Week: Not on file  . Minutes of Exercise per Session: Not on file  Stress:   . Feeling of Stress : Not on file  Social Connections:   . Frequency of Communication with Friends and Family: Not on  file  . Frequency of Social Gatherings with Friends and Family: Not on file  . Attends Religious Services: Not on file  . Active Member of Clubs or Organizations: Not on file  . Attends Archivist Meetings: Not on file  . Marital Status: Not on file  Intimate Partner Violence:   . Fear of Current or Ex-Partner: Not on file  . Emotionally Abused: Not on file  . Physically Abused: Not on file  . Sexually Abused: Not on file    FAMILY HISTORY: Family History  Problem Relation Age of Onset  . Colon cancer Mother   . Heart attack Father   . Diabetes Father   . Colon cancer Father   . Hemochromatosis Brother   . Liver disease Brother   .  Hypertension Brother   . Diabetes Brother   . Cancer - Lung Brother        smoker  . Healthy Daughter   . Sinusitis Son     ALLERGIES:  is allergic to bee venom, chocolate, cucumber extract, onion, orange fruit [citrus], and penicillins.  MEDICATIONS:  Current Outpatient Medications  Medication Sig Dispense Refill  . acetaminophen (TYLENOL) 500 MG tablet Take 500 mg by mouth every 6 (six) hours as needed for mild pain, moderate pain or headache.    Marland Kitchen amLODipine (NORVASC) 5 MG tablet Take 1 tablet (5 mg total) by mouth daily. (Patient not taking: Reported on 06/07/2019) 90 tablet 1  . aspirin 81 MG chewable tablet Chew 1 tablet (81 mg total) by mouth daily. 90 tablet 1  . atorvastatin (LIPITOR) 80 MG tablet Take 1 tablet (80 mg total) by mouth daily at 6 PM. (Patient taking differently: Take 40 mg by mouth daily at 6 PM. ) 90 tablet 1  . clonazePAM (KLONOPIN) 0.5 MG tablet Take 0.5 mg by mouth 2 (two) times daily as needed for anxiety.     . clopidogrel (PLAVIX) 75 MG tablet Take 1 tablet (75 mg total) by mouth daily with breakfast. 90 tablet 1  . diphenhydrAMINE (BENADRYL) 25 MG tablet Take 25 mg by mouth daily as needed for itching (bug bites).    . EPINEPHrine 0.3 mg/0.3 mL IJ SOAJ injection Inject 0.3 mLs (0.3 mg total) into the muscle  as needed for anaphylaxis. 1 Device 0  . folic acid (FOLVITE) 1 MG tablet Take 1 tablet by mouth once daily 30 tablet 0  . metFORMIN (GLUCOPHAGE) 500 MG tablet Take 1 tablet (500 mg total) by mouth 2 (two) times daily with a meal. 180 tablet 0  . metoprolol tartrate (LOPRESSOR) 25 MG tablet Take 1 tablet (25 mg total) by mouth 2 (two) times daily. (Patient taking differently: Take 12.5 mg by mouth 2 (two) times daily. ) 180 tablet 0  . nitroGLYCERIN (NITROSTAT) 0.4 MG SL tablet Place 1 tablet (0.4 mg total) under the tongue every 5 (five) minutes as needed for chest pain (CP or SOB). 25 tablet 1  . thiamine (VITAMIN B-1) 50 MG tablet Take 50 mg by mouth daily.      Marland Kitchen triamcinolone cream (KENALOG) 0.5 % Apply 1 application topically 3 (three) times daily as needed (itching/ insect bites).     No current facility-administered medications for this visit.    REVIEW OF SYSTEMS:   A 10+ POINT REVIEW OF SYSTEMS WAS OBTAINED including neurology, dermatology, psychiatry, cardiac, respiratory, lymph, extremities, GI, GU, Musculoskeletal, constitutional, breasts, reproductive, HEENT.  All pertinent positives are noted in the HPI.  All others are negative.   PHYSICAL EXAMINATION:  . Vitals:   06/09/20 0854  BP: (!) 125/92  Pulse: 68  Resp: 18  Temp: (!) 96.7 F (35.9 C)  SpO2: 98%   Filed Weights   06/09/20 0854  Weight: 170 lb 9.6 oz (77.4 kg)   .Body mass index is 26.33 kg/m.  NAD GENERAL:alert, in no acute distress and comfortable SKIN: no acute rashes, no significant lesions EYES: conjunctiva are pink and non-injected, sclera anicteric OROPHARYNX: MMM, no exudates, no oropharyngeal erythema or ulceration NECK: supple, no JVD LYMPH:  no palpable lymphadenopathy in the cervical, axillary or inguinal regions LUNGS: clear to auscultation b/l with normal respiratory effort HEART: regular rate & rhythm ABDOMEN:  normoactive bowel sounds , non tender, not distended. No palpable  hepatosplenomegaly.  Extremity: no pedal  edema PSYCH: alert & oriented x 3 with fluent speech NEURO: no focal motor/sensory deficits  LABORATORY DATA:  I have reviewed the data as listed  . CBC Latest Ref Rng & Units 05/22/2019 05/21/2019 05/20/2019  WBC 4.0 - 10.5 K/uL 7.4 6.8 7.3  Hemoglobin 13.0 - 17.0 g/dL 12.1(L) 13.6 13.1  Hematocrit 39 - 52 % 34.0(L) 39.9 38.3(L)  Platelets 150 - 400 K/uL 221 245 251    . CMP Latest Ref Rng & Units 05/22/2019 05/21/2019 05/20/2019  Glucose 70 - 99 mg/dL 114(H) 123(H) 156(H)  BUN 8 - 23 mg/dL 25(H) 25(H) 30(H)  Creatinine 0.61 - 1.24 mg/dL 1.39(H) 1.42(H) 1.43(H)  Sodium 135 - 145 mmol/L 142 142 140  Potassium 3.5 - 5.1 mmol/L 3.9 4.3 4.6  Chloride 98 - 111 mmol/L 111 109 110  CO2 22 - 32 mmol/L 23 25 21(L)  Calcium 8.9 - 10.3 mg/dL 8.6(L) 8.9 9.0  Total Protein 6.5 - 8.1 g/dL - - -  Total Bilirubin 0.3 - 1.2 mg/dL - - -  Alkaline Phos 38 - 126 U/L - - -  AST 15 - 41 U/L - - -  ALT 0 - 44 U/L - - -      RADIOGRAPHIC STUDIES: I have personally reviewed the radiological images as listed and agreed with the findings in the report. No results found.  ASSESSMENT & PLAN:  79 y.o. male with  1. 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  Pt gave blood twice a year prior to his diagnosis, likely explaining why his Ferritin was not much higher upon initial diagnosis at age 1 with a homozygous C282Y mutation. 01/03/2019 Hemochromatosis DNA shows "Two copies of the same mutation (C282Y and C282Y) identified".   PLAN: - patients  Iron labs are satisfactory with current therapeutic phlebotomy regimen. -ferritin 41 iron saturation 43% -we discussed that given his CKD and recent CAD would try to be more conservative the therapeutic phlebotomies to avoid developing anemia. -would recommend reducing frequent of therapeutic phlebototomies to every 4-6 months and  holding if hgb <12.  FOLLOW UP: RTC with Dr Irene Limbo with labs in 12 months   The total time spent in the appt was 20 minutes and more than 50% was on counseling and direct patient cares.  All of the patient's questions were answered with apparent satisfaction. The patient knows to call the clinic with any problems, questions or concerns.    Sullivan Lone MD [place] AAHIVMS [facility] Hematology/Oncology Physician [facility]  (Office):       [phone] (Work cell):  [phone] (Fax):           [phone]  06/09/2020 7:55 AM  I, Yevette Edwards, am acting as a scribe for Dr. Sullivan Lone.   .I have reviewed the above documentation for accuracy and completeness, and I agree with the above. Brunetta Genera MD

## 2020-11-25 DIAGNOSIS — I10 Essential (primary) hypertension: Secondary | ICD-10-CM | POA: Diagnosis not present

## 2020-11-25 DIAGNOSIS — E119 Type 2 diabetes mellitus without complications: Secondary | ICD-10-CM | POA: Diagnosis not present

## 2020-11-25 DIAGNOSIS — E785 Hyperlipidemia, unspecified: Secondary | ICD-10-CM | POA: Diagnosis not present

## 2020-11-25 DIAGNOSIS — N189 Chronic kidney disease, unspecified: Secondary | ICD-10-CM | POA: Diagnosis not present

## 2020-11-25 DIAGNOSIS — I25118 Atherosclerotic heart disease of native coronary artery with other forms of angina pectoris: Secondary | ICD-10-CM | POA: Diagnosis not present

## 2021-01-12 DIAGNOSIS — Z0000 Encounter for general adult medical examination without abnormal findings: Secondary | ICD-10-CM | POA: Diagnosis not present

## 2021-01-12 DIAGNOSIS — M150 Primary generalized (osteo)arthritis: Secondary | ICD-10-CM | POA: Diagnosis not present

## 2021-01-12 DIAGNOSIS — E7800 Pure hypercholesterolemia, unspecified: Secondary | ICD-10-CM | POA: Diagnosis not present

## 2021-01-12 DIAGNOSIS — L259 Unspecified contact dermatitis, unspecified cause: Secondary | ICD-10-CM | POA: Diagnosis not present

## 2021-01-12 DIAGNOSIS — E039 Hypothyroidism, unspecified: Secondary | ICD-10-CM | POA: Diagnosis not present

## 2021-01-12 DIAGNOSIS — E1169 Type 2 diabetes mellitus with other specified complication: Secondary | ICD-10-CM | POA: Diagnosis not present

## 2021-01-12 DIAGNOSIS — I10 Essential (primary) hypertension: Secondary | ICD-10-CM | POA: Diagnosis not present

## 2021-02-24 DIAGNOSIS — I25118 Atherosclerotic heart disease of native coronary artery with other forms of angina pectoris: Secondary | ICD-10-CM | POA: Diagnosis not present

## 2021-02-24 DIAGNOSIS — N189 Chronic kidney disease, unspecified: Secondary | ICD-10-CM | POA: Diagnosis not present

## 2021-02-24 DIAGNOSIS — E119 Type 2 diabetes mellitus without complications: Secondary | ICD-10-CM | POA: Diagnosis not present

## 2021-02-24 DIAGNOSIS — I10 Essential (primary) hypertension: Secondary | ICD-10-CM | POA: Diagnosis not present

## 2021-02-24 DIAGNOSIS — E785 Hyperlipidemia, unspecified: Secondary | ICD-10-CM | POA: Diagnosis not present

## 2021-03-18 DIAGNOSIS — Z961 Presence of intraocular lens: Secondary | ICD-10-CM | POA: Diagnosis not present

## 2021-03-18 DIAGNOSIS — H04123 Dry eye syndrome of bilateral lacrimal glands: Secondary | ICD-10-CM | POA: Diagnosis not present

## 2021-03-18 DIAGNOSIS — R7309 Other abnormal glucose: Secondary | ICD-10-CM | POA: Diagnosis not present

## 2021-04-22 DIAGNOSIS — R350 Frequency of micturition: Secondary | ICD-10-CM | POA: Diagnosis not present

## 2021-05-26 DIAGNOSIS — F17290 Nicotine dependence, other tobacco product, uncomplicated: Secondary | ICD-10-CM | POA: Diagnosis not present

## 2021-05-26 DIAGNOSIS — E785 Hyperlipidemia, unspecified: Secondary | ICD-10-CM | POA: Diagnosis not present

## 2021-05-26 DIAGNOSIS — I25118 Atherosclerotic heart disease of native coronary artery with other forms of angina pectoris: Secondary | ICD-10-CM | POA: Diagnosis not present

## 2021-05-26 DIAGNOSIS — I10 Essential (primary) hypertension: Secondary | ICD-10-CM | POA: Diagnosis not present

## 2021-05-26 DIAGNOSIS — E119 Type 2 diabetes mellitus without complications: Secondary | ICD-10-CM | POA: Diagnosis not present

## 2021-06-08 ENCOUNTER — Other Ambulatory Visit: Payer: Self-pay

## 2021-06-08 ENCOUNTER — Inpatient Hospital Stay: Payer: Medicare Other | Attending: Hematology

## 2021-06-08 ENCOUNTER — Inpatient Hospital Stay: Payer: Medicare Other | Admitting: Hematology

## 2021-06-08 DIAGNOSIS — Z7982 Long term (current) use of aspirin: Secondary | ICD-10-CM | POA: Diagnosis not present

## 2021-06-08 DIAGNOSIS — Z79899 Other long term (current) drug therapy: Secondary | ICD-10-CM | POA: Insufficient documentation

## 2021-06-08 DIAGNOSIS — Z87891 Personal history of nicotine dependence: Secondary | ICD-10-CM | POA: Diagnosis not present

## 2021-06-08 DIAGNOSIS — N189 Chronic kidney disease, unspecified: Secondary | ICD-10-CM | POA: Insufficient documentation

## 2021-06-08 LAB — CBC WITH DIFFERENTIAL (CANCER CENTER ONLY)
Abs Immature Granulocytes: 0.01 10*3/uL (ref 0.00–0.07)
Basophils Absolute: 0.1 10*3/uL (ref 0.0–0.1)
Basophils Relative: 1 %
Eosinophils Absolute: 0.2 10*3/uL (ref 0.0–0.5)
Eosinophils Relative: 3 %
HCT: 42 % (ref 39.0–52.0)
Hemoglobin: 14.9 g/dL (ref 13.0–17.0)
Immature Granulocytes: 0 %
Lymphocytes Relative: 34 %
Lymphs Abs: 2.5 10*3/uL (ref 0.7–4.0)
MCH: 33.2 pg (ref 26.0–34.0)
MCHC: 35.5 g/dL (ref 30.0–36.0)
MCV: 93.5 fL (ref 80.0–100.0)
Monocytes Absolute: 0.7 10*3/uL (ref 0.1–1.0)
Monocytes Relative: 10 %
Neutro Abs: 3.8 10*3/uL (ref 1.7–7.7)
Neutrophils Relative %: 52 %
Platelet Count: 271 10*3/uL (ref 150–400)
RBC: 4.49 MIL/uL (ref 4.22–5.81)
RDW: 12.1 % (ref 11.5–15.5)
WBC Count: 7.3 10*3/uL (ref 4.0–10.5)
nRBC: 0 % (ref 0.0–0.2)

## 2021-06-08 LAB — CMP (CANCER CENTER ONLY)
ALT: 16 U/L (ref 0–44)
AST: 18 U/L (ref 15–41)
Albumin: 4 g/dL (ref 3.5–5.0)
Alkaline Phosphatase: 50 U/L (ref 38–126)
Anion gap: 9 (ref 5–15)
BUN: 27 mg/dL — ABNORMAL HIGH (ref 8–23)
CO2: 22 mmol/L (ref 22–32)
Calcium: 9.6 mg/dL (ref 8.9–10.3)
Chloride: 109 mmol/L (ref 98–111)
Creatinine: 1.65 mg/dL — ABNORMAL HIGH (ref 0.61–1.24)
GFR, Estimated: 41 mL/min — ABNORMAL LOW (ref >60)
Glucose, Bld: 113 mg/dL — ABNORMAL HIGH (ref 70–99)
Potassium: 4.4 mmol/L (ref 3.5–5.1)
Sodium: 140 mmol/L (ref 135–145)
Total Bilirubin: 0.9 mg/dL (ref 0.3–1.2)
Total Protein: 7.4 g/dL (ref 6.5–8.1)

## 2021-06-08 LAB — IRON AND TIBC
Iron: 123 ug/dL (ref 42–163)
Saturation Ratios: 53 % (ref 20–55)
TIBC: 231 ug/dL (ref 202–409)
UIBC: 109 ug/dL — ABNORMAL LOW (ref 117–376)

## 2021-06-08 LAB — FERRITIN: Ferritin: 57 ng/mL (ref 24–336)

## 2021-06-14 NOTE — Progress Notes (Signed)
HEMATOLOGY/ONCOLOGY CLINIC NOTE  Date of Service: .06/08/2021   Patient Care Team: [name], L.Marlou Sa, MD as PCP - General (Family Medicine)  CHIEF COMPLAINTS/PURPOSE OF CONSULTATION:  Hereditary Hemochromatosis  HISTORY OF PRESENTING ILLNESS:   Jeffery Carpenter is a wonderful 80 y.o. male who has been referred to [place] by Dr. 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 The pt reports that he is doing well overall.   The pt reports that he was first diagnosed with homozygous hereditary hemochromatosis in 2001, pursuing testing after learning that his brother had the homozygous disease, who died of liver cancer. Both of the patient's parents were carriers. The pt notes that his Ferritin was 883 upon initial diagnosis 20 years ago, and prior to his diagnosis he had donated blood a couple times a year. He is not positive that he has the homozygous mutation, and molecular records of his diagnosis are not available. The pt currently donates blood to [facility] [employer] every 2 months or so, and notes his Ferritin remains around 50. The pt notes that he sees his PCP every 6 months with labs, and has been following up with Hematology once a year.   The pt notes that he is "80 years old, can do a lot of things," and remains fairly active and works out. He denies any other significant medical concerns and denies liver or heart problems. He notes that he has some degenerative discs which give him only some discomfort when he bends over.  Most recent lab results (04/16/18) of CBC w/diff and CMP is as follows: all values are WNL except for MCH at 33.3, Glucose at 175, Creatinine at 1.28, GFR at 53, Chloride at 109. 04/16/18 Ferritin at 49  On review of systems, pt reports stable energy levels, staying active, stable weight, eating well, and denies concerns for infections, and any other symptoms.  On PMHx the pt reports Homozygous Hereditary Hemochromatosis, denies liver or  heart problems. Erectile dysfunction. On Family Hx the pt reports brother with homozygous hereditary hemochromatosis. Daughter with carrier state of C282Y mutation.  INTERVAL HISTORY:  Jeffery Carpenter is a wonderful 80 y.o. 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 The patient's last visit with [place] was on 06/09/2020. The pt reports that he is doing well overall.  He reports that he has been [facility] 3 to 4 months over the last year.  Labs today show normal CBC with a hemoglobin of 14.9 normal WBC counts and platelets CMP-stable with a creatinine of 1.65 which is close to his baseline. Ferritin of 57 with iron saturation of 53%.  On review of systems, pt reports no other acute new symptoms.   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   Myocardial infarction [facility])    Other and unspecified hyperlipidemia 02/06/2014    SURGICAL HISTORY: Past Surgical History:  Procedure Laterality Date   APPENDECTOMY     BILATERAL CARPAL TUNNEL RELEASE Bilateral    CORONARY STENT INTERVENTION N/A 03/25/2019   Procedure: CORONARY STENT INTERVENTION;  Surgeon: Belva Crome, MD;  Location: [place] [facility];  Service: Cardiovascular;  Laterality: N/A;   EYE SURGERY     cataract   HERNIA REPAIR  abdomen   JOINT REPLACEMENT     lt TKA   KNEE ARTHROSCOPY Right    LEFT HEART CATH AND CORONARY ANGIOGRAPHY N/A 03/25/2019   Procedure: LEFT HEART CATH AND CORONARY ANGIOGRAPHY;  Surgeon: [name], MD;  Location: [place] [facility];  Service: Cardiovascular;  Laterality: N/A;   LEFT HEART CATH AND CORONARY ANGIOGRAPHY N/A 05/22/2019   Procedure: LEFT HEART CATH AND CORONARY ANGIOGRAPHY;  Surgeon: [name], MD;  Location: [place] [facility];  Service: Cardiovascular;  Laterality: N/A;   MASS EXCISION Left 09/12/2014   Procedure: LEFT ELBOW AND FOREARM MASS REMOVAL;  Surgeon: Roseanne Kaufman, MD;  Location: [place];  Service: Orthopedics;  Laterality: Left;   TONSILLECTOMY     WRIST SURGERY Left     SOCIAL HISTORY: Social History   Socioeconomic History   Marital status: Married    Spouse name: Not on file   Number of children: 2   Years of education: Not on file   Highest education level: Associate degree: academic program  Occupational History   Occupation: retired    Comment: Event organiser  Tobacco Use   Smoking status: Former    Types: Cigarettes    Quit date: 02/07/1960    Years since quitting: 61.3   Smokeless tobacco: Former  Scientific laboratory technician Use: Never used  Substance and Sexual Activity   Alcohol use: Yes    Alcohol/week: 0.0 standard drinks    Comment: 1 time a month   Drug use: No   Sexual activity: Not Currently  Other Topics Concern   Not on file  Social History Narrative   Pt is married lives with his spouseEdd Fabian, he has 2 children, he has a associate degree in welding    Pt lives in 1 story home- he is right handed- he does not drink coffee, tea, some soda    Social Determinants of Radio broadcast assistant Strain: Not on file  Food Insecurity: Not on file  Transportation Needs: Not on file  Physical Activity: Not on file  Stress: Not on file  Social Connections: Not on file  Intimate Partner Violence: Not on file    FAMILY HISTORY: Family History  Problem Relation Age of Onset   Colon cancer Mother    Heart attack Father    Diabetes Father    Colon cancer Father    Hemochromatosis Brother    Liver disease Brother    Hypertension Brother    Diabetes Brother    Cancer - Lung Brother        smoker   Healthy Daughter    Sinusitis Son     ALLERGIES:  is allergic to bee venom, chocolate, cucumber extract, onion, orange fruit [citrus],  and penicillins.  MEDICATIONS:  Current Outpatient Medications  Medication Sig Dispense Refill   acetaminophen (TYLENOL) 500 MG tablet Take 500 mg by mouth every 6 (six) hours as needed for mild pain, moderate pain or headache.     amLODipine (NORVASC) 5 MG tablet Take 1 tablet (5 mg total) by mouth daily. (Patient not taking: Reported on 06/07/2019) 90 tablet 1   aspirin 81 MG chewable tablet Chew 1 tablet (81 mg total) by mouth daily. 90 tablet 1   atorvastatin (LIPITOR) 80 MG tablet Take 1 tablet (80 mg total) by mouth daily at 6 PM. (Patient taking differently: Take 40 mg by mouth daily at 6 PM. ) 90 tablet 1   clonazePAM (KLONOPIN) 0.5 MG tablet  Take 0.5 mg by mouth 2 (two) times daily as needed for anxiety.  (Patient not taking: Reported on 06/09/2020)     clopidogrel (PLAVIX) 75 MG tablet Take 1 tablet (75 mg total) by mouth daily with breakfast. 90 tablet 1   diphenhydrAMINE (BENADRYL) 25 MG tablet Take 25 mg by mouth daily as needed for itching (bug bites).     EPINEPHrine 0.3 mg/0.3 mL IJ SOAJ injection Inject 0.3 mLs (0.3 mg total) into the muscle as needed for anaphylaxis. 1 Device 0   folic acid (FOLVITE) 1 MG tablet Take 1 tablet by mouth once daily 30 tablet 0   metFORMIN (GLUCOPHAGE) 500 MG tablet Take 1 tablet (500 mg total) by mouth 2 (two) times daily with a meal. 180 tablet 0   metoprolol tartrate (LOPRESSOR) 25 MG tablet Take 1 tablet (25 mg total) by mouth 2 (two) times daily. (Patient taking differently: Take 12.5 mg by mouth 2 (two) times daily. ) 180 tablet 0   nitroGLYCERIN (NITROSTAT) 0.4 MG SL tablet Place 1 tablet (0.4 mg total) under the tongue every 5 (five) minutes as needed for chest pain (CP or SOB). 25 tablet 1   thiamine (VITAMIN B-1) 50 MG tablet Take 50 mg by mouth daily.       triamcinolone cream (KENALOG) 0.5 % Apply 1 application topically 3 (three) times daily as needed (itching/ insect bites).     No current facility-administered medications for this  visit.    REVIEW OF SYSTEMS:   .10 Point review of Systems was done is negative except as noted above.  PHYSICAL EXAMINATION:  . Vitals:   06/08/21 1357  BP: (!) 127/94  Pulse: 70  Resp: 17  Temp: 98.2 F (36.8 C)  SpO2: 98%   Filed Weights   06/08/21 1357  Weight: 174 lb 8 oz (79.2 kg)   .Body mass index is 26.93 kg/m.  Marland Kitchen GENERAL:alert, in no acute distress and comfortable SKIN: no acute rashes, no significant lesions EYES: conjunctiva are pink and non-injected, sclera anicteric OROPHARYNX: MMM, no exudates, no oropharyngeal erythema or ulceration NECK: supple, no JVD LYMPH:  no palpable lymphadenopathy in the cervical, axillary or inguinal regions LUNGS: clear to auscultation b/l with normal respiratory effort HEART: regular rate & rhythm ABDOMEN:  normoactive bowel sounds , non tender, not distended. Extremity: no pedal edema PSYCH: alert & oriented x 3 with fluent speech NEURO: no focal motor/sensory deficits   LABORATORY DATA:  I have reviewed the data as listed  . CBC Latest Ref Rng & Units 06/08/2021 06/09/2020 05/22/2019  WBC 4.0 - 10.5 K/uL 7.3 5.8 7.4  Hemoglobin 13.0 - 17.0 g/dL 14.9 12.4(L) 12.1(L)  Hematocrit 39.0 - 52.0 % 42.0 35.8(L) 34.0(L)  Platelets 150 - 400 K/uL 271 257 221    . CMP Latest Ref Rng & Units 06/08/2021 06/09/2020 05/22/2019  Glucose 70 - 99 mg/dL 113(H) 166(H) 114(H)  BUN 8 - 23 mg/dL 27(H) 27(H) 25(H)  Creatinine 0.61 - 1.24 mg/dL 1.65(H) 1.64(H) 1.39(H)  Sodium 135 - 145 mmol/L 140 139 142  Potassium 3.5 - 5.1 mmol/L 4.4 4.4 3.9  Chloride 98 - 111 mmol/L 109 110 111  CO2 22 - 32 mmol/L 22 26 23   Calcium 8.9 - 10.3 mg/dL 9.6 9.0 8.6(L)  Total Protein 6.5 - 8.1 g/dL 7.4 6.6 -  Total Bilirubin 0.3 - 1.2 mg/dL 0.9 0.7 -  Alkaline Phos 38 - 126 U/L 50 50 -  AST 15 - 41 U/L 18 18 -  ALT  0 - 44 U/L 16 17 -    . Lab Results  Component Value Date   IRON 123 06/08/2021   TIBC 231 06/08/2021   IRONPCTSAT 53 06/08/2021    (Iron and TIBC)  Lab Results  Component Value Date   FERRITIN 57 06/08/2021     RADIOGRAPHIC STUDIES: I have personally reviewed the radiological images as listed and agreed with the findings in the report. No results found.  ASSESSMENT & PLAN:  80 y.o. male with  1. 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  Pt gave blood twice a year prior to his diagnosis, likely explaining why his Ferritin was not much higher upon initial diagnosis at age 1 with a homozygous C282Y mutation. 01/03/2019 Hemochromatosis DNA shows "Two copies of the same mutation (C282Y and C282Y) identified".   PLAN: - patients  Iron labs are satisfactory with current therapeutic phlebotomy regimen. -ferritin 57 with an iron saturation of 53% -we discussed that given his CKD and recent CAD would try to be more conservative the therapeutic phlebotomies to avoid developing anemia. -Goal is to maintain ferritin of less than 100 and iron saturation of less than 60% -would recommend reducing frequent of therapeutic phlebototomies to every 4-6 months and holding if hgb <12.  FOLLOW UP: RTC with Dr Irene Limbo as needed RTC with PCP  . The total time spent in the appointment was 20 minutes and more than 50% was on counseling and direct patient cares.   All of the patient's questions were answered with apparent satisfaction. The patient knows to call the clinic with any problems, questions or concerns.    Sullivan Lone MD [place] AAHIVMS [facility] Hematology/Oncology Physician [facility]

## 2021-07-20 DIAGNOSIS — N1831 Chronic kidney disease, stage 3a: Secondary | ICD-10-CM | POA: Diagnosis not present

## 2021-07-20 DIAGNOSIS — I10 Essential (primary) hypertension: Secondary | ICD-10-CM | POA: Diagnosis not present

## 2021-07-20 DIAGNOSIS — E7800 Pure hypercholesterolemia, unspecified: Secondary | ICD-10-CM | POA: Diagnosis not present

## 2021-07-20 DIAGNOSIS — I700 Atherosclerosis of aorta: Secondary | ICD-10-CM | POA: Diagnosis not present

## 2021-07-20 DIAGNOSIS — E1169 Type 2 diabetes mellitus with other specified complication: Secondary | ICD-10-CM | POA: Diagnosis not present

## 2021-07-20 DIAGNOSIS — K5900 Constipation, unspecified: Secondary | ICD-10-CM | POA: Diagnosis not present

## 2021-07-20 DIAGNOSIS — I2510 Atherosclerotic heart disease of native coronary artery without angina pectoris: Secondary | ICD-10-CM | POA: Diagnosis not present

## 2021-07-22 DIAGNOSIS — R3912 Poor urinary stream: Secondary | ICD-10-CM | POA: Diagnosis not present

## 2021-07-22 DIAGNOSIS — R350 Frequency of micturition: Secondary | ICD-10-CM | POA: Diagnosis not present

## 2021-08-25 DIAGNOSIS — I25118 Atherosclerotic heart disease of native coronary artery with other forms of angina pectoris: Secondary | ICD-10-CM | POA: Diagnosis not present

## 2021-08-25 DIAGNOSIS — E785 Hyperlipidemia, unspecified: Secondary | ICD-10-CM | POA: Diagnosis not present

## 2021-08-25 DIAGNOSIS — I10 Essential (primary) hypertension: Secondary | ICD-10-CM | POA: Diagnosis not present

## 2021-10-21 DIAGNOSIS — R350 Frequency of micturition: Secondary | ICD-10-CM | POA: Diagnosis not present

## 2021-10-21 DIAGNOSIS — R3915 Urgency of urination: Secondary | ICD-10-CM | POA: Diagnosis not present

## 2021-10-28 DIAGNOSIS — M545 Low back pain, unspecified: Secondary | ICD-10-CM | POA: Diagnosis not present

## 2021-10-28 DIAGNOSIS — M542 Cervicalgia: Secondary | ICD-10-CM | POA: Diagnosis not present

## 2021-11-01 DIAGNOSIS — M47816 Spondylosis without myelopathy or radiculopathy, lumbar region: Secondary | ICD-10-CM | POA: Diagnosis not present

## 2021-11-01 DIAGNOSIS — I10 Essential (primary) hypertension: Secondary | ICD-10-CM | POA: Diagnosis not present

## 2021-11-01 DIAGNOSIS — M5412 Radiculopathy, cervical region: Secondary | ICD-10-CM | POA: Diagnosis not present

## 2021-11-10 DIAGNOSIS — M542 Cervicalgia: Secondary | ICD-10-CM | POA: Diagnosis not present

## 2021-11-10 DIAGNOSIS — R202 Paresthesia of skin: Secondary | ICD-10-CM | POA: Diagnosis not present

## 2021-11-10 DIAGNOSIS — R200 Anesthesia of skin: Secondary | ICD-10-CM | POA: Diagnosis not present

## 2021-11-10 DIAGNOSIS — M47816 Spondylosis without myelopathy or radiculopathy, lumbar region: Secondary | ICD-10-CM | POA: Diagnosis not present

## 2021-11-10 DIAGNOSIS — M545 Low back pain, unspecified: Secondary | ICD-10-CM | POA: Diagnosis not present

## 2021-11-10 DIAGNOSIS — M5412 Radiculopathy, cervical region: Secondary | ICD-10-CM | POA: Diagnosis not present

## 2021-11-22 DIAGNOSIS — M47816 Spondylosis without myelopathy or radiculopathy, lumbar region: Secondary | ICD-10-CM | POA: Diagnosis not present

## 2021-11-22 DIAGNOSIS — M5412 Radiculopathy, cervical region: Secondary | ICD-10-CM | POA: Diagnosis not present

## 2021-11-24 DIAGNOSIS — I25118 Atherosclerotic heart disease of native coronary artery with other forms of angina pectoris: Secondary | ICD-10-CM | POA: Diagnosis not present

## 2021-11-24 DIAGNOSIS — I10 Essential (primary) hypertension: Secondary | ICD-10-CM | POA: Diagnosis not present

## 2021-11-24 DIAGNOSIS — F17290 Nicotine dependence, other tobacco product, uncomplicated: Secondary | ICD-10-CM | POA: Diagnosis not present

## 2021-11-24 DIAGNOSIS — E1169 Type 2 diabetes mellitus with other specified complication: Secondary | ICD-10-CM | POA: Diagnosis not present

## 2021-12-08 DIAGNOSIS — M5412 Radiculopathy, cervical region: Secondary | ICD-10-CM | POA: Diagnosis not present

## 2021-12-14 DIAGNOSIS — M545 Low back pain, unspecified: Secondary | ICD-10-CM | POA: Diagnosis not present

## 2021-12-14 DIAGNOSIS — G8929 Other chronic pain: Secondary | ICD-10-CM | POA: Diagnosis not present

## 2021-12-21 DIAGNOSIS — G8929 Other chronic pain: Secondary | ICD-10-CM | POA: Diagnosis not present

## 2021-12-21 DIAGNOSIS — M545 Low back pain, unspecified: Secondary | ICD-10-CM | POA: Diagnosis not present

## 2021-12-22 DIAGNOSIS — M5412 Radiculopathy, cervical region: Secondary | ICD-10-CM | POA: Diagnosis not present

## 2021-12-29 DIAGNOSIS — M545 Low back pain, unspecified: Secondary | ICD-10-CM | POA: Diagnosis not present

## 2021-12-29 DIAGNOSIS — G8929 Other chronic pain: Secondary | ICD-10-CM | POA: Diagnosis not present

## 2022-01-12 DIAGNOSIS — M25512 Pain in left shoulder: Secondary | ICD-10-CM | POA: Diagnosis not present

## 2022-01-12 DIAGNOSIS — G8929 Other chronic pain: Secondary | ICD-10-CM | POA: Diagnosis not present

## 2022-01-18 DIAGNOSIS — M25512 Pain in left shoulder: Secondary | ICD-10-CM | POA: Diagnosis not present

## 2022-01-18 DIAGNOSIS — S46012A Strain of muscle(s) and tendon(s) of the rotator cuff of left shoulder, initial encounter: Secondary | ICD-10-CM | POA: Diagnosis not present

## 2022-01-26 DIAGNOSIS — M25512 Pain in left shoulder: Secondary | ICD-10-CM | POA: Diagnosis not present

## 2022-01-28 DIAGNOSIS — M25512 Pain in left shoulder: Secondary | ICD-10-CM | POA: Diagnosis not present

## 2022-02-01 DIAGNOSIS — M5451 Vertebrogenic low back pain: Secondary | ICD-10-CM | POA: Diagnosis not present

## 2022-02-15 DIAGNOSIS — I700 Atherosclerosis of aorta: Secondary | ICD-10-CM | POA: Diagnosis not present

## 2022-02-15 DIAGNOSIS — E039 Hypothyroidism, unspecified: Secondary | ICD-10-CM | POA: Diagnosis not present

## 2022-02-15 DIAGNOSIS — I10 Essential (primary) hypertension: Secondary | ICD-10-CM | POA: Diagnosis not present

## 2022-02-15 DIAGNOSIS — Z0000 Encounter for general adult medical examination without abnormal findings: Secondary | ICD-10-CM | POA: Diagnosis not present

## 2022-02-15 DIAGNOSIS — M150 Primary generalized (osteo)arthritis: Secondary | ICD-10-CM | POA: Diagnosis not present

## 2022-02-15 DIAGNOSIS — E1169 Type 2 diabetes mellitus with other specified complication: Secondary | ICD-10-CM | POA: Diagnosis not present

## 2022-02-15 DIAGNOSIS — E7800 Pure hypercholesterolemia, unspecified: Secondary | ICD-10-CM | POA: Diagnosis not present

## 2022-02-15 DIAGNOSIS — N1831 Chronic kidney disease, stage 3a: Secondary | ICD-10-CM | POA: Diagnosis not present

## 2022-02-23 DIAGNOSIS — E785 Hyperlipidemia, unspecified: Secondary | ICD-10-CM | POA: Diagnosis not present

## 2022-02-23 DIAGNOSIS — I10 Essential (primary) hypertension: Secondary | ICD-10-CM | POA: Diagnosis not present

## 2022-02-23 DIAGNOSIS — E119 Type 2 diabetes mellitus without complications: Secondary | ICD-10-CM | POA: Diagnosis not present

## 2022-02-23 DIAGNOSIS — I25118 Atherosclerotic heart disease of native coronary artery with other forms of angina pectoris: Secondary | ICD-10-CM | POA: Diagnosis not present

## 2022-02-23 DIAGNOSIS — F17290 Nicotine dependence, other tobacco product, uncomplicated: Secondary | ICD-10-CM | POA: Diagnosis not present

## 2022-03-29 DIAGNOSIS — I2510 Atherosclerotic heart disease of native coronary artery without angina pectoris: Secondary | ICD-10-CM | POA: Diagnosis not present

## 2022-03-29 DIAGNOSIS — D6869 Other thrombophilia: Secondary | ICD-10-CM | POA: Diagnosis not present

## 2022-05-06 ENCOUNTER — Ambulatory Visit: Payer: Self-pay

## 2022-05-06 NOTE — Patient Outreach (Signed)
  Care Coordination   05/06/2022 Name: Jeffery Carpenter MRN: [id number] DOB: 1941-07-12   Care Coordination Outreach Attempts:  An unsuccessful telephone outreach was attempted today to offer the patient information about available care coordination services as a benefit of their health plan.   Follow Up Plan:  Additional outreach attempts will be made to offer the patient care coordination information and services.   Encounter Outcome:  No Answer  Care Coordination Interventions Activated:  No   Care Coordination Interventions:  No, not indicated    [facility] [phone]

## 2022-05-10 DIAGNOSIS — R7309 Other abnormal glucose: Secondary | ICD-10-CM | POA: Diagnosis not present

## 2022-05-10 DIAGNOSIS — H04123 Dry eye syndrome of bilateral lacrimal glands: Secondary | ICD-10-CM | POA: Diagnosis not present

## 2022-05-10 DIAGNOSIS — Z961 Presence of intraocular lens: Secondary | ICD-10-CM | POA: Diagnosis not present

## 2022-05-27 ENCOUNTER — Telehealth: Payer: Self-pay

## 2022-05-27 NOTE — Patient Outreach (Signed)
  Care Coordination   05/27/2022 Name: Jeffery Carpenter MRN: [id number] DOB: [birth year]   Care Coordination Outreach Attempts:  A second unsuccessful outreach was attempted today to offer the patient with information about available care coordination services as a benefit of their health plan.     Follow Up Plan:  Additional outreach attempts will be made to offer the patient care coordination information and services.   Encounter Outcome:  No Answer  Care Coordination Interventions Activated:  No   Care Coordination Interventions:  No, not indicated    [facility] [phone]

## 2022-08-16 DIAGNOSIS — I10 Essential (primary) hypertension: Secondary | ICD-10-CM | POA: Diagnosis not present

## 2022-08-16 DIAGNOSIS — K5900 Constipation, unspecified: Secondary | ICD-10-CM | POA: Diagnosis not present

## 2022-08-16 DIAGNOSIS — I700 Atherosclerosis of aorta: Secondary | ICD-10-CM | POA: Diagnosis not present

## 2022-08-16 DIAGNOSIS — I2510 Atherosclerotic heart disease of native coronary artery without angina pectoris: Secondary | ICD-10-CM | POA: Diagnosis not present

## 2022-08-16 DIAGNOSIS — N1831 Chronic kidney disease, stage 3a: Secondary | ICD-10-CM | POA: Diagnosis not present

## 2022-08-16 DIAGNOSIS — E7800 Pure hypercholesterolemia, unspecified: Secondary | ICD-10-CM | POA: Diagnosis not present

## 2022-08-16 DIAGNOSIS — E1169 Type 2 diabetes mellitus with other specified complication: Secondary | ICD-10-CM | POA: Diagnosis not present

## 2022-08-24 DIAGNOSIS — I10 Essential (primary) hypertension: Secondary | ICD-10-CM | POA: Diagnosis not present

## 2022-08-24 DIAGNOSIS — E785 Hyperlipidemia, unspecified: Secondary | ICD-10-CM | POA: Diagnosis not present

## 2022-08-24 DIAGNOSIS — E119 Type 2 diabetes mellitus without complications: Secondary | ICD-10-CM | POA: Diagnosis not present

## 2022-08-24 DIAGNOSIS — I25118 Atherosclerotic heart disease of native coronary artery with other forms of angina pectoris: Secondary | ICD-10-CM | POA: Diagnosis not present

## 2022-10-11 DIAGNOSIS — R319 Hematuria, unspecified: Secondary | ICD-10-CM | POA: Diagnosis not present

## 2023-02-23 DIAGNOSIS — D485 Neoplasm of uncertain behavior of skin: Secondary | ICD-10-CM | POA: Diagnosis not present

## 2023-02-23 DIAGNOSIS — M150 Primary generalized (osteo)arthritis: Secondary | ICD-10-CM | POA: Diagnosis not present

## 2023-02-23 DIAGNOSIS — E7800 Pure hypercholesterolemia, unspecified: Secondary | ICD-10-CM | POA: Diagnosis not present

## 2023-02-23 DIAGNOSIS — N1832 Chronic kidney disease, stage 3b: Secondary | ICD-10-CM | POA: Diagnosis not present

## 2023-02-23 DIAGNOSIS — E782 Mixed hyperlipidemia: Secondary | ICD-10-CM | POA: Diagnosis not present

## 2023-02-23 DIAGNOSIS — I25118 Atherosclerotic heart disease of native coronary artery with other forms of angina pectoris: Secondary | ICD-10-CM | POA: Diagnosis not present

## 2023-02-23 DIAGNOSIS — I10 Essential (primary) hypertension: Secondary | ICD-10-CM | POA: Diagnosis not present

## 2023-02-23 DIAGNOSIS — I700 Atherosclerosis of aorta: Secondary | ICD-10-CM | POA: Diagnosis not present

## 2023-02-23 DIAGNOSIS — E1169 Type 2 diabetes mellitus with other specified complication: Secondary | ICD-10-CM | POA: Diagnosis not present

## 2023-02-23 DIAGNOSIS — E039 Hypothyroidism, unspecified: Secondary | ICD-10-CM | POA: Diagnosis not present

## 2023-02-23 DIAGNOSIS — E1122 Type 2 diabetes mellitus with diabetic chronic kidney disease: Secondary | ICD-10-CM | POA: Diagnosis not present

## 2023-02-23 DIAGNOSIS — Z0000 Encounter for general adult medical examination without abnormal findings: Secondary | ICD-10-CM | POA: Diagnosis not present

## 2023-02-24 DIAGNOSIS — L723 Sebaceous cyst: Secondary | ICD-10-CM | POA: Diagnosis not present

## 2023-05-17 DIAGNOSIS — R7309 Other abnormal glucose: Secondary | ICD-10-CM | POA: Diagnosis not present

## 2023-05-17 DIAGNOSIS — H04123 Dry eye syndrome of bilateral lacrimal glands: Secondary | ICD-10-CM | POA: Diagnosis not present

## 2023-05-17 DIAGNOSIS — Z961 Presence of intraocular lens: Secondary | ICD-10-CM | POA: Diagnosis not present

## 2023-07-03 DIAGNOSIS — E1122 Type 2 diabetes mellitus with diabetic chronic kidney disease: Secondary | ICD-10-CM | POA: Diagnosis not present

## 2023-07-03 DIAGNOSIS — E782 Mixed hyperlipidemia: Secondary | ICD-10-CM | POA: Diagnosis not present

## 2023-07-03 DIAGNOSIS — I10 Essential (primary) hypertension: Secondary | ICD-10-CM | POA: Diagnosis not present

## 2023-07-03 DIAGNOSIS — I25118 Atherosclerotic heart disease of native coronary artery with other forms of angina pectoris: Secondary | ICD-10-CM | POA: Diagnosis not present

## 2023-08-23 DIAGNOSIS — E7800 Pure hypercholesterolemia, unspecified: Secondary | ICD-10-CM | POA: Diagnosis not present

## 2023-08-23 DIAGNOSIS — E1169 Type 2 diabetes mellitus with other specified complication: Secondary | ICD-10-CM | POA: Diagnosis not present

## 2023-08-23 DIAGNOSIS — E039 Hypothyroidism, unspecified: Secondary | ICD-10-CM | POA: Diagnosis not present

## 2023-08-25 DIAGNOSIS — N1832 Chronic kidney disease, stage 3b: Secondary | ICD-10-CM | POA: Diagnosis not present

## 2023-08-25 DIAGNOSIS — E7800 Pure hypercholesterolemia, unspecified: Secondary | ICD-10-CM | POA: Diagnosis not present

## 2023-08-25 DIAGNOSIS — I10 Essential (primary) hypertension: Secondary | ICD-10-CM | POA: Diagnosis not present

## 2023-08-25 DIAGNOSIS — I700 Atherosclerosis of aorta: Secondary | ICD-10-CM | POA: Diagnosis not present

## 2023-08-25 DIAGNOSIS — I2510 Atherosclerotic heart disease of native coronary artery without angina pectoris: Secondary | ICD-10-CM | POA: Diagnosis not present

## 2023-08-25 DIAGNOSIS — E039 Hypothyroidism, unspecified: Secondary | ICD-10-CM | POA: Diagnosis not present

## 2023-08-25 DIAGNOSIS — I25119 Atherosclerotic heart disease of native coronary artery with unspecified angina pectoris: Secondary | ICD-10-CM | POA: Diagnosis not present

## 2023-08-25 DIAGNOSIS — E1122 Type 2 diabetes mellitus with diabetic chronic kidney disease: Secondary | ICD-10-CM | POA: Diagnosis not present
